# Patient Record
Sex: Male | Born: 1940
Health system: Southern US, Community
[De-identification: ages and names within clinical notes are randomized; demographics above are authoritative.]

## PROBLEM LIST (undated history)

## (undated) DIAGNOSIS — C4491 Basal cell carcinoma of skin, unspecified: Secondary | ICD-10-CM

## (undated) DIAGNOSIS — I011 Acute rheumatic endocarditis: Secondary | ICD-10-CM

## (undated) DIAGNOSIS — M1990 Unspecified osteoarthritis, unspecified site: Secondary | ICD-10-CM

## (undated) DIAGNOSIS — I10 Essential (primary) hypertension: Secondary | ICD-10-CM

## (undated) DIAGNOSIS — R0600 Dyspnea, unspecified: Secondary | ICD-10-CM

## (undated) DIAGNOSIS — M069 Rheumatoid arthritis, unspecified: Secondary | ICD-10-CM

## (undated) DIAGNOSIS — K219 Gastro-esophageal reflux disease without esophagitis: Secondary | ICD-10-CM

## (undated) DIAGNOSIS — N400 Enlarged prostate without lower urinary tract symptoms: Secondary | ICD-10-CM

## (undated) DIAGNOSIS — C439 Malignant melanoma of skin, unspecified: Secondary | ICD-10-CM

## (undated) DIAGNOSIS — D649 Anemia, unspecified: Secondary | ICD-10-CM

## (undated) HISTORY — DX: Malignant melanoma of skin, unspecified: C43.9

## (undated) HISTORY — DX: Basal cell carcinoma of skin, unspecified: C44.91

## (undated) HISTORY — DX: Anemia, unspecified: D64.9

## (undated) HISTORY — DX: Unspecified osteoarthritis, unspecified site: M19.90

## (undated) HISTORY — PX: JOINT REPLACEMENT: SHX530

## (undated) HISTORY — PX: MELANOMA EXCISION: SHX5266

## (undated) HISTORY — DX: Rheumatoid arthritis, unspecified: M06.9

## (undated) HISTORY — DX: Gastro-esophageal reflux disease without esophagitis: K21.9

## (undated) HISTORY — DX: Acute rheumatic endocarditis: I01.1

## (undated) HISTORY — PX: HERNIA REPAIR: SHX51

## (undated) HISTORY — DX: Benign prostatic hyperplasia without lower urinary tract symptoms: N40.0

## (undated) HISTORY — PX: KNEE ARTHROPLASTY: SHX992

## (undated) HISTORY — PX: ESOPHAGEAL DILATION: SHX303

## (undated) HISTORY — PX: COLONOSCOPY: SHX174

---

## 1999-08-22 ENCOUNTER — Ambulatory Visit (HOSPITAL_COMMUNITY): Admission: RE | Admit: 1999-08-22 | Discharge: 1999-08-22 | Payer: Self-pay | Admitting: Gastroenterology

## 1999-08-22 ENCOUNTER — Encounter (INDEPENDENT_AMBULATORY_CARE_PROVIDER_SITE_OTHER): Payer: Self-pay | Admitting: *Deleted

## 2000-08-06 ENCOUNTER — Inpatient Hospital Stay (HOSPITAL_COMMUNITY): Admission: RE | Admit: 2000-08-06 | Discharge: 2000-08-11 | Payer: Self-pay | Admitting: Orthopaedic Surgery

## 2000-08-11 ENCOUNTER — Inpatient Hospital Stay (HOSPITAL_COMMUNITY)
Admission: RE | Admit: 2000-08-11 | Discharge: 2000-08-17 | Payer: Self-pay | Admitting: Physical Medicine & Rehabilitation

## 2004-05-15 ENCOUNTER — Ambulatory Visit (HOSPITAL_COMMUNITY): Admission: RE | Admit: 2004-05-15 | Discharge: 2004-05-15 | Payer: Self-pay | Admitting: Gastroenterology

## 2004-05-15 ENCOUNTER — Encounter (INDEPENDENT_AMBULATORY_CARE_PROVIDER_SITE_OTHER): Payer: Self-pay | Admitting: Specialist

## 2004-12-23 ENCOUNTER — Ambulatory Visit: Payer: Self-pay | Admitting: Oncology

## 2005-04-11 ENCOUNTER — Ambulatory Visit: Payer: Self-pay | Admitting: Oncology

## 2005-07-01 ENCOUNTER — Ambulatory Visit: Payer: Self-pay | Admitting: Oncology

## 2005-10-30 ENCOUNTER — Ambulatory Visit: Payer: Self-pay | Admitting: Oncology

## 2007-12-02 ENCOUNTER — Ambulatory Visit (HOSPITAL_BASED_OUTPATIENT_CLINIC_OR_DEPARTMENT_OTHER): Admission: RE | Admit: 2007-12-02 | Discharge: 2007-12-02 | Payer: Self-pay | Admitting: Surgery

## 2011-01-14 NOTE — Op Note (Signed)
NAMEMIKAELE, STECHER                 ACCOUNT NO.:  [id number]   MEDICAL RECORD NO.:  [id number]          PATIENT TYPE:  AMB   LOCATION:  DSC                          FACILITY:  [facility]   PHYSICIAN:  Currie Paris, M.D.DATE OF BIRTH:  02/13/1941   DATE OF PROCEDURE:  DATE OF DISCHARGE:                               OPERATIVE REPORT   CCS [phone]   PREOPERATIVE DIAGNOSIS:  Large left inguinal hernia, probably indirect.   POSTOPERATIVE DIAGNOSIS:  Large left indirect hernia.   OPERATION:  Repair of left inguinal hernia with mesh.   SURGEON:  Currie Paris, M.D.   ANESTHESIA:  General.   CLINICAL HISTORY:  Mr. Antunes is a 71 year old gentleman with a fairly  large but reducible left inguinal hernia.  He desired to have this  repaired.   DESCRIPTION OF PROCEDURE:  The patient was seen in the preoperative area  and had no further questions.  We both identified and initialed the left  inguinal area as the operative site.   The patient was taken to the operating room, and after satisfactory  general anesthesia had been obtained, the left inguinal area was  clipped, prepped and draped.  The time-out was done.   To help with postoperative analgesia, I infiltrated the skin line and  around that area as well as below the fascia at the anterior iliac spine  with some 0.25% plain Marcaine.  The oblique incision was made and  deepened into the external oblique aponeurosis, with bleeders coagulated  or tied with 4-0 Vicryl.  The external oblique was opened in the line of  its fibers.  Of note is that the hernia was out at the time of induction  of anesthesia, but I was able to readily reduce it prior to making the  incision.   I was able to dissect up the cord structures and there was a large  amount of tissue protruding through the deep ring.  The floor appeared  intact.  There was really no true sac but just and extraordinarily large  amount of fatty tissue protruding through.  I  stripped this off of the  cord.  I was able to identify the vas and some of the vessels to the  testicle, but there were also some vessels within this fatty tissue.  I  was able to reduce this easily, but even putting a couple of sutures to  reapproximate the deep ring with some 2-0 Prolenes closing the  transversalis, we still had the tendency for this to protrude out  because it was still attached to the distal cord.  I therefore divided  it distally so that I could reduce this and keep it reduced and then put  a third suture to tighten the deep ring so that it was snug around the  now skeletonized cord.   I then put more local in to make sure we had good anesthesia  postoperatively.  I then took a piece of Marlex mesh and cut it to shape  so that it was split laterally and sutured in starting at pubic tubercle  and running some 2-0 Prolene to suture it inferiorly until I was well  beyond the deep ring.  Is then put down over the internal oblique such  that it laid nicely and then tacked down to the internal oblique and  then tacked laterally where it went well lateral to the deep ring.  I  thought this nicely reinforced the floor plus made an appropriate  tightness around the deep ring.   I spent several minutes making sure everything was dry and then closed  with some 3-0 Vicryl to close the external oblique and Scarpa's and 4-0  Monocryl plus Dermabond for the skin.   The patient tolerated the procedure well, and there no complications.  All counts were correct.      Currie Paris, M.D.  Electronically Signed     CJS/MEDQ  D:  12/02/2007  T:  12/02/2007  Job:  [id number]   cc:   Demetria Pore. Coral Spikes, M.D.

## 2011-01-17 NOTE — Discharge Summary (Signed)
[place]. [facility]  Patient:    Bryan Maldonado, Bryan Maldonado                        MRN: [id number] Adm. Date:  [id number] Disc. Date: [id number] Attending:  Faith Rogue T Dictator:   Prince Rome, P.A.-C.                           Discharge Summary  ADMISSION DIAGNOSES: 1. Bilateral end-stage bone-on-bone degenerative joint disease, knees. 2. Hypertension. 3. History of malignant skin melanoma and rheumatoid arthritis.  DISCHARGE DIAGNOSES: 1. Bilateral end-stage bone-on-bone degenerative joint disease, knees. 2. Hypertension. 3. History of malignant skin melanoma and rheumatoid arthritis.  OPERATION:  Bilateral total knee replacement.  BRIEF HISTORY:  This patient is a 70 year old white male and is well known to [place].  He has rheumatoid arthritis and has bone-on-bone end-stage DJD of both of his knees.  He is having pain with every step, difficulty in bending, difficulty in ambulating, trouble sleeping at nighttime.  We have injected him with corticosteroids with minimal relief.  I discussed treatment options with the patient including knee replaced.  He decided, if it was medically feasible, to do both knees at the same time.  PERTINENT LABORATORY AND X-RAY FINDINGS:  His last hemoglobin was 9.6.  INRs and pro times were drawn serially as he was on low-dose Coumadin protocol regulated by pharmacy.  Blood was replaced as necessary.  He was noted to be A positive blood type.  COURSE IN HOSPITAL:  The patient was admitted postoperatively, placed on a variety of analgesics and IV Ancef 1 g q.8h. x 3 doses.  His pain was controlled on a PCA pump.  His wounds were benign on the dressing change and drain pulling on the second day postoperatively.  Pharmacy helped with low-dose Coumadin protocol.  Physical therapy for gait training to be weightbearing as tolerated.  A rehabilitation consult was done, and he was accepted on the rehabilitation floor for continued  therapy.  CONDITION UPON DISCHARGE:  Improved.  FOLLOWUP:  We will continue to follow up with him on the rehabilitation floor. He will remain on Coumadin low-dose protocol for four weeks, p.o. medications, weightbearing as tolerated with help of physical therapy. DD:  09/26/00 TD:  09/26/00 Job: [id number]

## 2011-01-17 NOTE — Op Note (Signed)
[place]. [facility]  Patient:    Bryan Maldonado, Bryan Maldonado                        MRN: [id number] Proc. Date: 08/06/00 Adm. Date:  [id number] Attending:  Marcene Corning CC:         Demetria Pore. Coral Spikes, M.D.   Operative Report  PREOPERATIVE DIAGNOSIS:  End-stage degenerative joint disease, left knee with rheumatoid arthritis.  POSTOPERATIVE DIAGNOSIS:  End-stage degenerative joint disease, left knee with rheumatoid arthritis.  OPERATION PERFORMED:  Left total knee arthroplasty using cemented on the left, Depuy J&J components, a sigma rotating platform knee.  SURGEON:  Jearld Adjutant, M.D.  ASSISTANT: 1. Lubertha Basque Jerl Santos, M.D. 2. Prince Rome, P.A.  ANESTHESIA:  General endotracheal.  CULTURES:  None.  DRAINS:  Two medium Hemovacs.  ESTIMATED BLOOD LOSS:  150 cc.  FLUID REPLACEMENT:  Without.  TOURNIQUET TIME:  One hour 22 minutes.  PATHOLOGIC FINDINGS AND HISTORY:  The patient is a 70 year old male who has rheumatoid disease, bilateral knee degenerative joint disease end-stage. Dr. Jerl Santos did the right total knee arthroplasty today with me assisting.  I did the left and he assisted and I will dictate.  The patient has had longstanding disease.  He had a slight flexion contracture.  At surgery, he obtained good ligament balance with full extension with a #4 left femur, a #4 10 mm stability rotating platform insert, 4 MBT tray and a 38 mm patella, all poly.  There was no need on the left for a lateral retinacular release, patellar tracking was good.  Range of motion 0 to 125 with a drop test.  His ligaments were stable.  DESCRIPTION OF PROCEDURE:  With adequate anesthesia obtained using endotracheal technique 1 gm Ancef given IV prophylaxis, the patient was placed in supine position.  Both lower extremities were prepped from the toes to the tourniquet in standard fashion.  After standard prepping and draping, Esmarch exsanguination was used on  the right side and the right total knee arthroplasty carried out.  On the left side then at the end of that procedure when the tourniquet was coming down, we used Esmarch exsanguination and the tourniquet was let up to 350 mmHg.  A midline incision was made over the patella.  Incision was deepened sharply with a knife and hemostasis obtained using a Associate Professor.  Flaps were developed and a median parapatellar skin incision was then made.  The patella was then everted and a marked amount of synovitis and osteophytes were removed, pouch and both gutters and the fat pad was excised.  We then removed menisci and soft tissue from the tibial surface.  The tibial cutting jig was then put in place to the key to the low side and the cut was made initially in the midrange position. We then sized the extension cut to 10 mm, placed the distal femoral cutting jig in place.  We made that cut but we took more off femur and then went back and took more off tibia.  We then fit the 10 mm in extension.  We then placed an anterior-posterior cutting jig in place and made that cut for a 4.  We then cut the anterior posterior chamfer cuts and the box. We then exposed the proximal tibia.  We then made the sizing for the tray with the keel and center stem and made those cuts and placed it in as a trial.  This was  with the keel cut made and the keel in place.  We then trialed the femur with the tibial tray and 10 mm insert and it was still too tight and we went back and cut about 3 to 4 mm of tibia, then replaced the tibial tray, the trial 10 mm and the femoral component and we came to full extension with no tightness in flexion.  Then we ____________  the patella to 21, cut it down to 13 and 14, made a three-peg  holes and trialed the patella with good tracking.  All trial components were then removed.  Thorough jet lavage was carried out and the components checked for sizing as they came on the table.  We then cemented on the tibialtray and impacted it, removed excess cement.  We then placed the trial tibial rotating platform.  We cemented on the femoral component, impacted it, removed excess cement, put the tibial tray in place and then hyperextended the knee and further expressed cement and removed it.  We  then held the knee in extension while the cement cured to hold the femoral component in place to compress it downward.  The patella was then cemented on and impacted, removed excess cement and held with a clamp.  When the cement had hardened, we then placed the implantable real tray in place, rotatingh platform and put the knee through an articulated range of motion as above.  At this point  the tourniquet was let down, bleeding points were cauterized, additional irrigation was carried out.  Hemovac drains were laid in the medial and lateral gutter and brought out the superolateral portal. The wound was then closed in layers with #1 Vicryl on the retinaculum, 0 and 2-0 Vicryl in the subcutaneous and skin staples.  A bulky sterile compressive dressing was applied with Ace and then knee immobilizer and the patient then having tolerated the procedure well was awakened and taken to the recovery room in satisfactory condition for routine postoperative care and CPM. DD:  08/06/00 TD:  08/06/00 Job: [id number]

## 2011-01-17 NOTE — Op Note (Signed)
[place]. [facility]  Patient:    Bryan Maldonado, Bryan Maldonado                        MRN: [id number] Proc. Date: 08/06/00 Adm. Date:  [id number] Attending:  Marcene Corning                           Operative Report  PREOPERATIVE DIAGNOSIS:  Right knee degenerative arthritis.  POSTOPERATIVE DIAGNOSIS:  Right knee degenerative arthritis.  PROCEDURE:  Right total knee replacement.  ANESTHESIA:  General.  SURGEON:  Lubertha Basque. Jerl Santos, M.D.  ASSISTANT:  Jearld Adjutant, M.D. and Prince Rome, P.A.  INDICATIONS:   The patient is a 70 year old male with a long history of rheumatoid arthritis, which has lead to degenerative changes in both knees. This has persisted despite oral medications and injections and at this point has been left with disabling pain.  He is offered knee replacement surgery. We planned to do bilateral knee replacements after thorough discussion of risks and benefits.  Informed operative consent was obtained after discussion of possible complications of reaction to anesthesia, infection, DVT, PE, and death.  DESCRIPTION OF PROCEDURE:  The patient was taken to the operating suite, where general anesthetic was applied without difficulty.  He was positioned supine and prepped and draped in normal sterile fashion.  After the administration of preoperative IV antibiotics, the right leg was elevated, exsanguinated and a tourniquet inflated about the thigh.  All appropriate anti-infective measures are used including the aforementioned IV antibiotic, closed-hooded exhaust systems for each member of the surgical team and Betadine-impregnated drape. A longitudinal incision was made over the knee with dissection down to the extensor mechanism.  A medial parapatellar incision was through this structure.  The knee cap was flipped and the knee flexed past 90 degrees.  He had severe degenerative changes in all three compartments with abundant synovitis.  A  thorough synovectomy was performed, along with removal of residual meniscal tissues and ACL and PCL.  We used the [employer] and [employer]. We first performed a cut on the tibia with an appropriate guide parallel to the floor.  I then placed an intramedullary guide in the femur in order to create a distal cut on the femur.  This was revised once to create an extension gap of 10 mm.  This help correct his flexion contracture, which measured about 20 degrees or 15 degrees a sleep.  Another guide was then placed on the femur to create an appropriate flexion gap with anterior and posterior cuts on the femur.  Another guide was placed to make the size 4 femoral chamfer cuts.  This guide was also used to create the box cut in the end of the distal femur.  The tibia also sized to a size 4 and an appropriate guide was placed here and used to create the keel cut.  A trial reduction was performed with size 4 femur and size 4 tibia and a 10 mm stabilized insert. The knee ranged fully and came to extension with good flexion past 100 degrees.  The knee cap tended to track in a slightly lateral position and a lateral release was added.  We then addressed the patella and took its thickness from 20 down to 13 mm with a guide.  I then placed the tibial preparation jig on the cut surface and made the appropriate holes to hold the component.  All trial components were then removed.  Pulsatile lavage was used to irrigate all bony surfaces.  Cement was mixed including antibiotic.  This cement was then pressurized on all three bony surfaces, followed by placement of the Sigma RP [employer] components, which were size 4 tibia, size 4 femur, and 38 mm patella.  Pressure was held on all of these components until the cement had hardened.  At this point, the 10 mm stabilized insert was placed.  The knee reduced well and the patella tracked well.  The knee came to full extension and flexed past 100  degrees.   The tourniquet was deflated and the wound was thoroughly irrigated.  Bovie cautery was used to control some bleeding.  Number one Vicryl was used to reapproximate the extensor mechanism after a drain had been placed exiting superolaterally.  Once the extensor mechanism was closed, the knee flexed to about 120 degrees.  Subcutaneous tissues were reapproximated with 0 and 2-0 undyed Vicryl, followed by skin closure with staples.  Adaptic was placed on the wound, followed by dry gauze and a loose Ace wrap and a knee immobilizer.  Estimated blood loss and intraoperative fluids can be obtained from anesthesia records, as can accurate tourniquet time.  DISPOSITION:  The patient was then prepped for a left knee replacement, during which, Dr. Renae Fickle was the primary surgeon and I was the assistant. DD:  08/06/00 TD:  08/06/00 Job: [id number]
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
## 2011-03-10 ENCOUNTER — Other Ambulatory Visit: Payer: Self-pay | Admitting: Gastroenterology

## 2011-05-27 LAB — BASIC METABOLIC PANEL
BUN: 8
CO2: 28
Calcium: 8.7
Creatinine, Ser: 0.52
GFR calc Af Amer: >60

## 2011-09-23 ENCOUNTER — Other Ambulatory Visit: Payer: Self-pay | Admitting: Dermatology

## 2013-01-04 ENCOUNTER — Encounter: Payer: Self-pay | Admitting: *Deleted

## 2013-01-05 ENCOUNTER — Ambulatory Visit (INDEPENDENT_AMBULATORY_CARE_PROVIDER_SITE_OTHER): Payer: Medicare Other | Admitting: Family Medicine

## 2013-01-05 ENCOUNTER — Encounter: Payer: Self-pay | Admitting: Family Medicine

## 2013-01-05 VITALS — BP 122/77 | HR 60 | Wt 177.0 lb

## 2013-01-05 DIAGNOSIS — M0579 Rheumatoid arthritis with rheumatoid factor of multiple sites without organ or systems involvement: Secondary | ICD-10-CM | POA: Insufficient documentation

## 2013-01-05 DIAGNOSIS — Z79899 Other long term (current) drug therapy: Secondary | ICD-10-CM

## 2013-01-05 DIAGNOSIS — E785 Hyperlipidemia, unspecified: Secondary | ICD-10-CM

## 2013-01-05 DIAGNOSIS — M069 Rheumatoid arthritis, unspecified: Secondary | ICD-10-CM

## 2013-01-05 DIAGNOSIS — G894 Chronic pain syndrome: Secondary | ICD-10-CM

## 2013-01-05 LAB — LIPID PANEL
HDL: 54 mg/dL (ref >39)
LDL Cholesterol: 100 mg/dL — ABNORMAL HIGH (ref 0–99)
Triglycerides: 95 mg/dL (ref <150)

## 2013-01-05 LAB — HEPATIC FUNCTION PANEL
Albumin: 4.4 g/dL (ref 3.5–5.2)
Alkaline Phosphatase: 60 U/L (ref 39–117)
Total Protein: 7.7 g/dL (ref 6.0–8.3)

## 2013-01-05 LAB — BASIC METABOLIC PANEL
Calcium: 9.2 mg/dL (ref 8.4–10.5)
Chloride: 103 mEq/L (ref 96–112)
Creat: 0.68 mg/dL (ref 0.50–1.35)

## 2013-01-05 MED ORDER — ACETAMINOPHEN-CODEINE #3 300-30 MG PO TABS: 1.0000 | ORAL_TABLET | Freq: Two times a day (BID) | ORAL | Status: DC | PRN

## 2013-01-05 MED ORDER — OMEPRAZOLE 20 MG PO CPDR: 20.0000 mg | DELAYED_RELEASE_CAPSULE | Freq: Every day | ORAL | Status: DC

## 2013-01-05 NOTE — Progress Notes (Signed)
  Subjective:    Patient ID: Bryan Maldonado, male    DOB: 09/02/1940, 72 y.o.   MRN: [id number]  HPI Arthritis-this patient has severe arthritis he is seen arthritis doctors before they tried disease altering medications. He could not use the injections because of a history of hepatitis. He has decided mainly to use just pain medication N. Deal with the orthopedic problems of arthritis. He has significant loss of mobility and deformity and needs help with his wife to get dressed. Pain medication does help him function in he does not abuse it he is aware about not combining too much Tylenol together with the Tylenol No. 3.  GERD-He has significant reflux but he uses omeprazole this helps he denies burning in the chest denies shortness of breath laryngeal discomfort. He does try to eat sensibly.  HyperlipidemiaHe does relate that he tries to watch his diet closely has a history hyperlipidemia he did is due for a wellness exam in September. We will need to do some lab work. Past medical history pertinent for arthritis family history noncontributory patient doesn't smoke. He denies chest tightness pressure pain shortness breath rectal bleeding hematuria.   Review of Systemsnegative see above. Has severe joint discomforts and deformities.     Objective:   Physical Exam Vital signs are stable. Neck no masses lungs are clear no crackles heart is regular pulse normal abdomen soft skin warm dry severe orthopedic changes with limited range of motion of the shoulders lower back and significant deformity of the hands       Assessment & Plan:  Wellness in august/ sept Arthritis-pain control Tylenol when necessary Tylenol #3 and do not use more than 2500 mg Tylenol total per day Tylenol No. 3 no greater than 3 per day #60 with refills given. Followup again in 3-4 months. Hyperlipidemia-check lab work await results. Followup if ongoing troubles. 25 minutes spent with patient discussing his arthritis  discussing medication options. Greater than half spent in discussion

## 2013-01-05 NOTE — Patient Instructions (Signed)
It is important when you take your pain medicine to use no more than 3 in a day. As we talked about your pain medication has codeine in it which helps with the pain but it also has some Tylenol in it. Therefore you would not want to take too many of those along with your Tylenol arthritis because it would be too much Tylenol which would be bad for your liver.  Over the course of the next week or 2 please do your blood work I will let you know the results of that. We ought to see you somewhere in August or September for your wellness exam. Sooner if you have any problems.

## 2013-01-11 ENCOUNTER — Telehealth: Payer: Self-pay | Admitting: Family Medicine

## 2013-01-11 NOTE — Telephone Encounter (Signed)
Patient states he was seen Dr. Jorja Loa today.  Dr. Jorja Loa informed patient he should have a referral to see Dr. Bedelia Person at [facility].  Patient states he is having trouble feet/toes hurting him on a daily basis (States this is not new).  Gathering from the information Mr. Muzyka shared when walking in today, He and Dr. Jorja Loa had conversation regarding Mr. Lebron's Rheumatoid Arthritis and possibly going crippled.  Patient seems to be concerned that he needs to be checked by rheumatologist.  Please contact Patient and advise as to whether he needs an office visit with Dr. Lilyan Punt to discuss these matters or just a referral.

## 2013-01-19 ENCOUNTER — Other Ambulatory Visit: Payer: Self-pay | Admitting: Family Medicine

## 2013-01-19 DIAGNOSIS — M1990 Unspecified osteoarthritis, unspecified site: Secondary | ICD-10-CM

## 2013-01-27 NOTE — Telephone Encounter (Signed)
Referral has been faxed to Dr. Corliss Skains on 01/21/2013.

## 2013-01-31 ENCOUNTER — Ambulatory Visit (INDEPENDENT_AMBULATORY_CARE_PROVIDER_SITE_OTHER): Payer: Medicare Other | Admitting: Family Medicine

## 2013-01-31 ENCOUNTER — Encounter: Payer: Self-pay | Admitting: Family Medicine

## 2013-01-31 VITALS — BP 126/76 | Temp 97.4°F | Wt 179.0 lb

## 2013-01-31 DIAGNOSIS — M069 Rheumatoid arthritis, unspecified: Secondary | ICD-10-CM

## 2013-01-31 MED ORDER — PREDNISONE 5 MG PO TABS: 5.0000 mg | ORAL_TABLET | Freq: Every day | ORAL | Status: DC

## 2013-01-31 NOTE — Progress Notes (Signed)
  Subjective:    Patient ID: Bryan Maldonado, male    DOB: 02/20/1941, 72 y.o.   MRN: [id number]  Arm Pain  The incident occurred more than 1 week ago. There was no injury mechanism. The pain is present in the upper right arm. The quality of the pain is described as aching. The pain does not radiate. The pain has been worsening since the incident. Associated symptoms include muscle weakness. The symptoms are aggravated by lifting. He has tried acetaminophen for the symptoms. The treatment provided mild relief.   This patient has underlying health history of the arthritis. He relates shoulder pain and discomfort hurts with movement has limited range of motion. He is scheduled to see specialists hopefully within the next month. They have received the information and will be scheduling him seen.   Review of SystemsSee above.     Objective:   Physical Exam Lungs clear hearts regular limited range of motion of the right shoulder with subjective discomfort significant arthritis       Assessment & Plan:  Rheumatoid arthritis-I. Feel this pain is associated with that I don't think it's a pinched nerve. I recommend prednisone 5 mg a day until he sees a specialist he is to followup sometime within the next 3 months with [place].

## 2013-04-29 ENCOUNTER — Telehealth: Payer: Self-pay | Admitting: Family Medicine

## 2013-04-29 MED ORDER — PREDNISONE 5 MG PO TABS: 5.0000 mg | ORAL_TABLET | Freq: Every day | ORAL | Status: DC

## 2013-04-29 NOTE — Telephone Encounter (Signed)
May refill times 4 , f/u ov this fall

## 2013-04-29 NOTE — Telephone Encounter (Signed)
Med sent electronically to [facility] in [place].

## 2013-04-29 NOTE — Telephone Encounter (Signed)
Arthritis flare.

## 2013-04-29 NOTE — Telephone Encounter (Signed)
Patient needs a refill on predniSONE (DELTASONE) 5 MG tablet  [facility] in [place]  Please call Patient. Thanks

## 2013-04-29 NOTE — Telephone Encounter (Signed)
Prednisone 5 mg daily 

## 2013-04-29 NOTE — Telephone Encounter (Signed)
Talk with patient, find out regarding is he taking prednisone 5 daily? Did rheumatology change anything, clirify what he needs....thanks

## 2013-05-18 ENCOUNTER — Encounter: Payer: Medicare Other | Admitting: Family Medicine

## 2013-05-19 ENCOUNTER — Encounter: Payer: Self-pay | Admitting: Family Medicine

## 2013-05-19 ENCOUNTER — Ambulatory Visit (INDEPENDENT_AMBULATORY_CARE_PROVIDER_SITE_OTHER): Payer: Medicare Other | Admitting: Family Medicine

## 2013-05-19 VITALS — BP 128/70 | HR 90 | Ht 68.0 in | Wt 181.0 lb

## 2013-05-19 DIAGNOSIS — Z0000 Encounter for general adult medical examination without abnormal findings: Secondary | ICD-10-CM

## 2013-05-19 DIAGNOSIS — Z23 Encounter for immunization: Secondary | ICD-10-CM

## 2013-05-19 MED ORDER — ZOSTER VACCINE LIVE 19400 UNT/0.65ML ~~LOC~~ SOLR: 0.6500 mL | Freq: Once | SUBCUTANEOUS | Status: DC

## 2013-05-19 NOTE — Progress Notes (Signed)
  Subjective:    Patient ID: Bryan Maldonado, male    DOB: 06/07/1941, 72 y.o.   MRN: [id number]  HPIHere for a wellness exam. No falls in the last year. Waist measurement is 37.5 in. Failed mini cog. MMSE score is 24.   AWV- Annual Wellness Visit  The patient was seen for their annual wellness visit. The patient's past medical history, surgical history, and family history were reviewed. Pertinent vaccines were reviewed ( tetanus, pneumonia, shingles, flu) The patient's medication list was reviewed and updated. The height and weight were entered. The patient's current BMI is:27  Cognitive screening was completed. Outcome of Mini - Cog: failed / MMSE 24/30  Falls within the past 6 months:no Current tobacco usage:never (All patients who use tobacco were given written and verbal information on quitting)  Recent listing of emergency department/hospitalizations over the past year were reviewed.  current specialist the patient sees on a regular basis: Dr Brunilda Payor- Urology, Dr,Deveshwar- arthritis/rheumatology  Medicare annual wellness visit patient questionnaire was reviewed.  A written screening schedule for the patient for the next 5-10 years was given. Appropriate discussion of followup regarding next visit was discussed.        Review of Systems  Constitutional: Negative for fever, activity change and appetite change.  HENT: Negative for congestion, rhinorrhea and neck pain.   Eyes: Negative for discharge.  Respiratory: Negative for cough and wheezing.   Cardiovascular: Negative for chest pain.  Gastrointestinal: Negative for vomiting, abdominal pain and blood in stool.  Genitourinary: Negative for frequency and difficulty urinating.  Musculoskeletal: Positive for back pain, joint swelling and arthralgias.  Skin: Negative for rash.  Allergic/Immunologic: Negative for environmental allergies and food allergies.  Neurological: Negative for weakness and headaches.   Psychiatric/Behavioral: Negative for agitation.       Objective:   Physical Exam  Constitutional: He appears well-developed and well-nourished.  HENT:  Head: Normocephalic and atraumatic.  Right Ear: External ear normal.  Left Ear: External ear normal.  Nose: Nose normal.  Mouth/Throat: Oropharynx is clear and moist.  Eyes: EOM are normal. Pupils are equal, round, and reactive to light.  Neck: Normal range of motion. Neck supple. No thyromegaly present.  Cardiovascular: Normal rate, regular rhythm and normal heart sounds.   No murmur heard. Pulmonary/Chest: Effort normal and breath sounds normal. No respiratory distress. He has no wheezes.  Abdominal: Soft. Bowel sounds are normal. He exhibits no distension and no mass. There is no tenderness.  Genitourinary: Penis normal.  Musculoskeletal: He exhibits tenderness. He exhibits no edema.  Severe arthritis in hands  Lymphadenopathy:    He has no cervical adenopathy.  Neurological: He is alert. He exhibits normal muscle tone.  Skin: Skin is warm and dry. No erythema.  Psychiatric: He has a normal mood and affect. His behavior is normal. Judgment normal.          Assessment & Plan:  Refills Arthritis Shingles vaccine recommended Pneumonia vaccine today Colonoscopy good through 2022 Flu vaccine today Follow up in 3 to 4 months Patient was encouraged to lose weight. He does have pain medication I told him we would renew these he ought to followup in 3-4 months he will let us know when he needs a new refill

## 2013-05-19 NOTE — Patient Instructions (Addendum)
Thank you for coming for your annual wellness visit.  Please follow through on any advice that was given to you by today's visit. Remember to maintain compliance with your medications as discussed today.  Also remember it is important to eat a healthy diet and to stay physically active on a daily basis.  Please follow through with any testing or recommended followup office visits as was discussed today. You are due the following test coming up:  ALL- Colonoscopy-2022           Vaccines-do your shingles vaccine please     Males- Prostate Exam_yearly (do this with Dr.Nesi)     Finally remembered that the annual wellness visit does not take the place of regularly scheduled office visits  chronic health problems such as hypertension/diabetes/cholesterol visits.  Follow up here in 3 to 4 months

## 2013-06-23 ENCOUNTER — Ambulatory Visit (INDEPENDENT_AMBULATORY_CARE_PROVIDER_SITE_OTHER): Payer: Medicare Other | Admitting: Family Medicine

## 2013-06-23 ENCOUNTER — Encounter: Payer: Self-pay | Admitting: Family Medicine

## 2013-06-23 VITALS — BP 136/76 | Temp 97.7°F | Ht 68.0 in | Wt 183.2 lb

## 2013-06-23 DIAGNOSIS — J069 Acute upper respiratory infection, unspecified: Secondary | ICD-10-CM

## 2013-06-23 DIAGNOSIS — J209 Acute bronchitis, unspecified: Secondary | ICD-10-CM

## 2013-06-23 MED ORDER — AZITHROMYCIN 250 MG PO TABS: ORAL_TABLET | ORAL | Status: DC

## 2013-06-23 NOTE — Progress Notes (Signed)
  Subjective:    Patient ID: Bryan Maldonado, male    DOB: 06/19/1941, 72 y.o.   MRN: [id number]  Cough This is a new problem. The current episode started in the past 7 days. The cough is productive of sputum. Associated symptoms include nasal congestion. Nothing aggravates the symptoms. He has tried nothing for the symptoms.   PMH benign rheumatoid arthritis she's on methotrexate wonders if he ought to keep taking it  Patient doesn't smoke. Review of Systems  Respiratory: Positive for cough.    denies chest pain shortness breath vomiting diarrhea.     Objective:   Physical Exam Lungs are clear cough noted heart regular eardrums normal throat normal.       Assessment & Plan:  Bronchitis Zithromax as directed URI viral should run its course Arthritis may continue methotrexate hold if fevers if fevers followup immediately here or ER

## 2013-09-13 ENCOUNTER — Other Ambulatory Visit: Payer: Self-pay

## 2013-09-13 MED ORDER — OMEPRAZOLE 20 MG PO CPDR: 20.0000 mg | DELAYED_RELEASE_CAPSULE | Freq: Every day | ORAL | Status: DC

## 2013-09-19 ENCOUNTER — Encounter: Payer: Self-pay | Admitting: Family Medicine

## 2013-09-19 ENCOUNTER — Ambulatory Visit (INDEPENDENT_AMBULATORY_CARE_PROVIDER_SITE_OTHER): Payer: Medicare Other | Admitting: Family Medicine

## 2013-09-19 VITALS — BP 100/72 | Ht 68.0 in | Wt 186.2 lb

## 2013-09-19 DIAGNOSIS — G894 Chronic pain syndrome: Secondary | ICD-10-CM

## 2013-09-19 DIAGNOSIS — M069 Rheumatoid arthritis, unspecified: Secondary | ICD-10-CM

## 2013-09-19 MED ORDER — ACETAMINOPHEN-CODEINE #3 300-30 MG PO TABS: 1.0000 | ORAL_TABLET | Freq: Three times a day (TID) | ORAL | Status: DC | PRN

## 2013-09-19 MED ORDER — PREDNISONE 5 MG PO TABS: 5.0000 mg | ORAL_TABLET | Freq: Every day | ORAL | Status: DC

## 2013-09-19 MED ORDER — OMEPRAZOLE 20 MG PO CPDR: 20.0000 mg | DELAYED_RELEASE_CAPSULE | Freq: Every day | ORAL | Status: DC

## 2013-09-19 NOTE — Progress Notes (Signed)
   Subjective:    Patient ID: Bryan Maldonado, male    DOB: 1941/01/31, 73 y.o.   MRN: [id number]  HPI Patient is here today for a follow up visit on his rheumatoid arthritis. He is doing very well. No other concerns at this time.  This patient has some intermittent chronic pain due to the rheumatoid arthritis he relates overall his energy level is good he denies any chest tightness pressure pain shortness breath he states he's taken his medicines as directed and has no concerns currently  Review of Systems  Constitutional: Negative for activity change, appetite change and fatigue.  Respiratory: Negative for cough and chest tightness.   Cardiovascular: Negative for chest pain.  Gastrointestinal: Negative for abdominal pain.  Endocrine: Negative for polydipsia and polyphagia.  Genitourinary: Negative for hematuria.  Neurological: Negative for weakness.  Psychiatric/Behavioral: Negative for confusion.       Objective:   Physical Exam  Vitals reviewed. Constitutional: He appears well-nourished. No distress.  Cardiovascular: Normal rate, regular rhythm and normal heart sounds.   No murmur heard. Pulmonary/Chest: Effort normal and breath sounds normal. No respiratory distress.  Musculoskeletal: He exhibits no edema.  Lymphadenopathy:    He has no cervical adenopathy.  Neurological: He is alert.  Psychiatric: His behavior is normal.   This gentleman has significant arthritic changes of the hands and feet       Assessment & Plan:  Prostate followed by Dr. Lowella Bandy at [place], he has appointment in February he'll let us know if any changes occur  Rheumatoid by Dr Estanislado Pandy, has appointment in March she'll let us know if any changes occurred  Refill of his low-dose prednisone and his omeprazole was given. He'll followup in 6 months  Tylenol #3 refills was given. When he needs more we will be willing to provide this. He uses it rather sparingly I am comfortable seeing him back in 6  months unless he starts to have use it daily

## 2014-03-20 ENCOUNTER — Ambulatory Visit (INDEPENDENT_AMBULATORY_CARE_PROVIDER_SITE_OTHER): Payer: Medicare Other | Admitting: Family Medicine

## 2014-03-20 ENCOUNTER — Encounter: Payer: Self-pay | Admitting: Family Medicine

## 2014-03-20 VITALS — BP 128/82 | Ht 68.0 in | Wt 195.2 lb

## 2014-03-20 DIAGNOSIS — R739 Hyperglycemia, unspecified: Secondary | ICD-10-CM

## 2014-03-20 DIAGNOSIS — E785 Hyperlipidemia, unspecified: Secondary | ICD-10-CM

## 2014-03-20 DIAGNOSIS — M069 Rheumatoid arthritis, unspecified: Secondary | ICD-10-CM

## 2014-03-20 DIAGNOSIS — R7309 Other abnormal glucose: Secondary | ICD-10-CM

## 2014-03-20 DIAGNOSIS — G894 Chronic pain syndrome: Secondary | ICD-10-CM

## 2014-03-20 MED ORDER — OMEPRAZOLE 20 MG PO CPDR: 20.0000 mg | DELAYED_RELEASE_CAPSULE | Freq: Every day | ORAL | Status: DC

## 2014-03-20 MED ORDER — ACETAMINOPHEN-CODEINE #3 300-30 MG PO TABS: 1.0000 | ORAL_TABLET | Freq: Three times a day (TID) | ORAL | Status: DC | PRN

## 2014-03-20 MED ORDER — PREDNISONE 5 MG PO TABS: 5.0000 mg | ORAL_TABLET | Freq: Every day | ORAL | Status: DC

## 2014-03-20 NOTE — Progress Notes (Signed)
   Subjective:    Patient ID: Bryan Maldonado, male    DOB: 1941/04/16, 73 y.o.   MRN: [id number]  HPI Patient arrives for a follow up on arthritis. See below.  Review of Systems  Constitutional: Negative for activity change, appetite change and fatigue.  HENT: Negative for congestion.   Respiratory: Negative for cough.   Cardiovascular: Negative for chest pain.  Gastrointestinal: Negative for abdominal pain.  Endocrine: Negative for polydipsia and polyphagia.  Neurological: Negative for weakness.  Psychiatric/Behavioral: Negative for confusion.       Objective:   Physical Exam  Vitals reviewed. Constitutional: He appears well-nourished. No distress.  Cardiovascular: Normal rate, regular rhythm and normal heart sounds.   No murmur heard. Pulmonary/Chest: Effort normal and breath sounds normal. No respiratory distress.  Musculoskeletal: He exhibits no edema.  Lymphadenopathy:    He has no cervical adenopathy.  Neurological: He is alert.  Psychiatric: His behavior is normal.   Severe arthritis he uses his pain medicine denies abusing it doesn't need prescription. He feels that his overall health is hanging in there he does try to       Assessment & Plan:  refuill pain med given, will call when he needs another  Wellness in Sept  Arthritis via specialist

## 2014-05-02 ENCOUNTER — Telehealth: Payer: Self-pay | Admitting: Family Medicine

## 2014-05-02 MED ORDER — OMEPRAZOLE 20 MG PO CPDR: 20.0000 mg | DELAYED_RELEASE_CAPSULE | Freq: Every day | ORAL | Status: DC

## 2014-05-02 NOTE — Telephone Encounter (Signed)
Patient needs Rx for omeprazole 20 mg to [facility].

## 2014-05-02 NOTE — Telephone Encounter (Signed)
Rx sent electronically to pharmacy. Patient notified. 

## 2014-05-22 ENCOUNTER — Encounter: Payer: Self-pay | Admitting: Family Medicine

## 2014-05-22 ENCOUNTER — Ambulatory Visit (INDEPENDENT_AMBULATORY_CARE_PROVIDER_SITE_OTHER): Payer: Medicare Other | Admitting: Family Medicine

## 2014-05-22 VITALS — BP 132/80 | Ht <= 58 in | Wt 195.2 lb

## 2014-05-22 DIAGNOSIS — N400 Enlarged prostate without lower urinary tract symptoms: Secondary | ICD-10-CM

## 2014-05-22 DIAGNOSIS — Z0000 Encounter for general adult medical examination without abnormal findings: Secondary | ICD-10-CM

## 2014-05-22 DIAGNOSIS — Z23 Encounter for immunization: Secondary | ICD-10-CM

## 2014-05-22 NOTE — Progress Notes (Signed)
   Subjective:    Patient ID: Bryan Maldonado, male    DOB: 1941/03/13, 73 y.o.   MRN: [id number]  HPI AWV- Annual Wellness Visit  The patient was seen for their annual wellness visit. The patient's past medical history, surgical history, and family history were reviewed. Pertinent vaccines were reviewed ( tetanus, pneumonia, shingles, flu) The patient's medication list was reviewed and updated.  The height and weight were entered. The patient's current BMI is:41.52  Cognitive screening was completed. Outcome of Mini - Cog: passed  Falls within the past 6 months:none  Current tobacco usage: non-smoker (All patients who use tobacco were given written and verbal information on quitting)  Recent listing of emergency department/hospitalizations over the past year were reviewed.  current specialist the patient sees on a regular basis: Dr. Reginal Lutes, Dr. Rocky Crafts, Dr. Marge Duncans   Medicare annual wellness visit patient questionnaire was reviewed.  A written screening schedule for the patient for the next 5-10 years was given. Appropriate discussion of followup regarding next visit was discussed.  Patient states that he has no concerns at this time.     Review of Systems  Constitutional: Negative for fever, activity change and appetite change.  HENT: Negative for congestion and rhinorrhea.   Eyes: Negative for discharge.  Respiratory: Negative for cough and wheezing.   Cardiovascular: Negative for chest pain.  Gastrointestinal: Negative for vomiting, abdominal pain and blood in stool.  Genitourinary: Negative for frequency and difficulty urinating.  Musculoskeletal: Positive for arthralgias, back pain and neck pain.  Skin: Negative for rash.  Allergic/Immunologic: Negative for environmental allergies and food allergies.  Neurological: Negative for weakness and headaches.  Psychiatric/Behavioral: Negative for agitation.       Objective:   Physical Exam  Constitutional: He appears  well-developed and well-nourished.  HENT:  Head: Normocephalic and atraumatic.  Right Ear: External ear normal.  Left Ear: External ear normal.  Nose: Nose normal.  Mouth/Throat: Oropharynx is clear and moist.  Eyes: EOM are normal. Pupils are equal, round, and reactive to light.  Neck: Normal range of motion. Neck supple. No thyromegaly present.  Cardiovascular: Normal rate, regular rhythm and normal heart sounds.   No murmur heard. Pulmonary/Chest: Effort normal and breath sounds normal. No respiratory distress. He has no wheezes.  Abdominal: Soft. Bowel sounds are normal. He exhibits no distension and no mass. There is no tenderness.  Genitourinary: Penis normal.  Musculoskeletal: He exhibits no edema.  Severe arthritis  Lymphadenopathy:    He has no cervical adenopathy.  Neurological: He is alert. He exhibits normal muscle tone.  Skin: Skin is warm and dry. No erythema.  Psychiatric: He has a normal mood and affect. His behavior is normal. Judgment normal.          Assessment & Plan:  Will tend to PSA with his urologist Dr Phineas Inches is up-to-date on colonoscopy. Immunizations reviewed. Safety measures dietary measures all discussed.  Patient does have arthritis managed by specialists we do prescribe pain medicine but he has plenty currently

## 2014-05-22 NOTE — Patient Instructions (Signed)
Next colonoscopy 2022  Follow up in the spring

## 2014-05-23 LAB — LIPID PANEL
Cholesterol: 226 mg/dL — ABNORMAL HIGH (ref 0–200)
HDL: 70 mg/dL
LDL Cholesterol: 132 mg/dL — ABNORMAL HIGH (ref 0–99)
Total CHOL/HDL Ratio: 3.2 ratio
Triglycerides: 120 mg/dL
VLDL: 24 mg/dL (ref 0–40)

## 2014-05-23 LAB — BASIC METABOLIC PANEL
BUN: 15 mg/dL (ref 6–23)
CHLORIDE: 102 meq/L (ref 96–112)
CO2: 28 mEq/L (ref 19–32)
Calcium: 9.5 mg/dL (ref 8.4–10.5)
Creat: 0.75 mg/dL (ref 0.50–1.35)
GLUCOSE: 111 mg/dL — AB (ref 70–99)
POTASSIUM: 4.1 meq/L (ref 3.5–5.3)
SODIUM: 138 meq/L (ref 135–145)

## 2014-05-23 LAB — HEMOGLOBIN A1C
Hgb A1c MFr Bld: 5.9 % — ABNORMAL HIGH
Mean Plasma Glucose: 123 mg/dL — ABNORMAL HIGH

## 2014-09-27 ENCOUNTER — Telehealth: Payer: Self-pay | Admitting: Family Medicine

## 2014-09-27 DIAGNOSIS — M8580 Other specified disorders of bone density and structure, unspecified site: Secondary | ICD-10-CM

## 2014-09-27 NOTE — Telephone Encounter (Signed)
Please pull his paper chart. Please look through it and see when his last bone density was completed thank you

## 2014-09-28 NOTE — Addendum Note (Signed)
Addended by: Jesusita Oka on: 09/28/2014 04:34 PM   Modules accepted: Orders

## 2014-09-28 NOTE — Telephone Encounter (Signed)
[facility] on cell and home #

## 2014-09-28 NOTE — Telephone Encounter (Signed)
Pt due for bone density set up test ( dx osteopenia) for Feb will need follow up office visit by spring

## 2014-09-28 NOTE — Telephone Encounter (Signed)
Last bone density was 07/03/2011, according to paper chart

## 2014-09-28 NOTE — Telephone Encounter (Signed)
Wife Arbie Cookey) called back and stated that she will have the patient give [place] a call tomorrow about setting up test.

## 2014-10-06 ENCOUNTER — Other Ambulatory Visit: Payer: Self-pay | Admitting: Family Medicine

## 2014-10-27 ENCOUNTER — Telehealth: Payer: Self-pay | Admitting: Family Medicine

## 2014-10-27 MED ORDER — PERMETHRIN 5 % EX CREA: 1.0000 "application " | TOPICAL_CREAM | Freq: Once | CUTANEOUS | Status: DC

## 2014-10-27 NOTE — Telephone Encounter (Signed)
Pt advised that he will need a wellness visit sometime this spring  He is currently expressing a need for some cream to treat scabies on  His hand   [facility]

## 2014-10-27 NOTE — Telephone Encounter (Signed)
Sent in elimite cream per protocol.

## 2014-11-07 ENCOUNTER — Telehealth: Payer: Self-pay | Admitting: Family Medicine

## 2014-11-07 MED ORDER — OMEPRAZOLE 20 MG PO CPDR: 20.0000 mg | DELAYED_RELEASE_CAPSULE | Freq: Every day | ORAL | Status: DC

## 2014-11-07 MED ORDER — PREDNISONE 5 MG PO TABS: 5.0000 mg | ORAL_TABLET | Freq: Every day | ORAL | Status: DC

## 2014-11-07 NOTE — Telephone Encounter (Signed)
Pt is needing a refill on his omeprazole and his prednisone.

## 2014-11-07 NOTE — Telephone Encounter (Signed)
Rxs sent electronically to pharmacy. Patient notified. 

## 2014-11-07 NOTE — Telephone Encounter (Signed)
May refill each 4

## 2014-11-20 ENCOUNTER — Encounter: Payer: Self-pay | Admitting: Family Medicine

## 2014-11-20 ENCOUNTER — Ambulatory Visit (INDEPENDENT_AMBULATORY_CARE_PROVIDER_SITE_OTHER): Payer: PPO | Admitting: Family Medicine

## 2014-11-20 VITALS — BP 118/76 | Ht 68.0 in | Wt 194.0 lb

## 2014-11-20 DIAGNOSIS — G894 Chronic pain syndrome: Secondary | ICD-10-CM

## 2014-11-20 DIAGNOSIS — R7309 Other abnormal glucose: Secondary | ICD-10-CM | POA: Diagnosis not present

## 2014-11-20 DIAGNOSIS — N400 Enlarged prostate without lower urinary tract symptoms: Secondary | ICD-10-CM | POA: Diagnosis not present

## 2014-11-20 DIAGNOSIS — E785 Hyperlipidemia, unspecified: Secondary | ICD-10-CM

## 2014-11-20 DIAGNOSIS — R7303 Prediabetes: Secondary | ICD-10-CM | POA: Insufficient documentation

## 2014-11-20 MED ORDER — PREDNISONE 1 MG PO TABS: 4.0000 mg | ORAL_TABLET | Freq: Every day | ORAL | Status: DC

## 2014-11-20 MED ORDER — OMEPRAZOLE 20 MG PO CPDR: 20.0000 mg | DELAYED_RELEASE_CAPSULE | Freq: Every day | ORAL | Status: DC

## 2014-11-20 NOTE — Progress Notes (Signed)
   Subjective:    Patient ID: Bryan Maldonado, male    DOB: Nov 16, 1940, 74 y.o.   MRN: [id number]  HPIMed check up.   Sinus congestion started 2 weeks ago. Has this problem every spring.   Having a biopsy on prostate on April 6th. Would like to discuss stopping vitamins and asa for the procedure.  Patient states that he has ongoing rheumatoid arthritis he sees a specialist on a regular basis. He denies any problems wonders if he taper prednisone see discussion below Patient also relates chronic pain he uses pain medication as necessary to help with the pain he denies excessive use of it. Does not need refill currently States urination doing okay denies any abdominal symptoms relates reflux under good control with Prilosec     Review of Systems  Constitutional: Negative for activity change, appetite change and fatigue.  HENT: Negative for congestion.   Respiratory: Negative for cough.   Cardiovascular: Negative for chest pain.  Gastrointestinal: Negative for abdominal pain.  Endocrine: Negative for polydipsia and polyphagia.  Neurological: Negative for weakness.  Psychiatric/Behavioral: Negative for confusion.       Objective:   Physical Exam  Constitutional: He appears well-nourished. No distress.  Cardiovascular: Normal rate, regular rhythm and normal heart sounds.   No murmur heard. Pulmonary/Chest: Effort normal and breath sounds normal. No respiratory distress.  Musculoskeletal: He exhibits no edema.  Lymphadenopathy:    He has no cervical adenopathy.  Neurological: He is alert.  Psychiatric: His behavior is normal.  Vitals reviewed.  Patient with severe rheumatoid arthritis changes  25 minutes spent with patient going over his medicines and discussing these multiple issues greater than half was spent in discussion     Assessment & Plan:  Rheumatoid arthritis follow through with specialist continue medication Chronic prednisone use he was told by his rheumatologist  he may stop it he wonders if he can just stop it I told him it is best for him to taper gradually we will take away 1 mg per month starting now 4 mg for the next month gradually tapered to mid July will be off of it. Chronic pain-she does not need a refill today uses it sparingly Patient due for cholesterol profile this was ordered Patient due for renal function testing Reflux symptoms under good control Prilosec Will be getting prostate biopsy coming up with urology Follow-up again in 6 months

## 2014-11-20 NOTE — Patient Instructions (Signed)
Prednisone  Decrease from 5 mg to 4 mg daily  So therefore use 4 pills once daily for 1 month  Then 3 pills once daily for 1 month  Then 2 pills once daily for 1 month  Then 1 pill daily for 1 month

## 2014-11-21 ENCOUNTER — Encounter: Payer: Self-pay | Admitting: Family Medicine

## 2014-11-21 LAB — HEMOGLOBIN A1C
Est. average glucose Bld gHb Est-mCnc: 131 mg/dL
HEMOGLOBIN A1C: 6.2 % — AB (ref 4.8–5.6)

## 2014-11-21 LAB — LIPID PANEL
Chol/HDL Ratio: 2.7 ratio units (ref 0.0–5.0)
Cholesterol, Total: 203 mg/dL — ABNORMAL HIGH (ref 100–199)
HDL: 75 mg/dL (ref >39)
LDL Calculated: 91 mg/dL (ref 0–99)
Triglycerides: 185 mg/dL — ABNORMAL HIGH (ref 0–149)
VLDL Cholesterol Cal: 37 mg/dL (ref 5–40)

## 2015-05-22 ENCOUNTER — Ambulatory Visit: Payer: PPO | Admitting: Family Medicine

## 2015-05-22 ENCOUNTER — Ambulatory Visit (INDEPENDENT_AMBULATORY_CARE_PROVIDER_SITE_OTHER): Payer: PPO | Admitting: Family Medicine

## 2015-05-22 ENCOUNTER — Encounter: Payer: Self-pay | Admitting: Family Medicine

## 2015-05-22 VITALS — BP 130/80 | Ht 68.0 in | Wt 193.1 lb

## 2015-05-22 DIAGNOSIS — D229 Melanocytic nevi, unspecified: Secondary | ICD-10-CM | POA: Diagnosis not present

## 2015-05-22 DIAGNOSIS — Z23 Encounter for immunization: Secondary | ICD-10-CM

## 2015-05-22 DIAGNOSIS — G894 Chronic pain syndrome: Secondary | ICD-10-CM

## 2015-05-22 MED ORDER — ACETAMINOPHEN-CODEINE #3 300-30 MG PO TABS: 1.0000 | ORAL_TABLET | Freq: Three times a day (TID) | ORAL | Status: DC | PRN

## 2015-05-22 NOTE — Progress Notes (Addendum)
   Subjective:    Patient ID: Bryan Maldonado, male    DOB: 07/25/1941, 74 y.o.   MRN: [id number]  HPI Patient is here today for follow up visit on rheumatoid arthritis. Patient is doing very well. Patient states he has a spot on the top of his head that he would like the doctor to look at.  Patient states arthritis under fairly decent control he does uses pain medicine to help take the edge off pain denies abusing it. 15 minutes spent with patient. Patient has no other concerns at this time.  Review of Systems  Constitutional: Negative for activity change, appetite change and fatigue.  HENT: Negative for congestion.   Respiratory: Negative for cough.   Cardiovascular: Negative for chest pain.  Gastrointestinal: Negative for abdominal pain.  Endocrine: Negative for polydipsia and polyphagia.  Neurological: Negative for weakness.  Psychiatric/Behavioral: Negative for confusion.       Objective:   Physical Exam  Constitutional: He appears well-nourished. No distress.  Cardiovascular: Normal rate, regular rhythm and normal heart sounds.   No murmur heard. Pulmonary/Chest: Effort normal and breath sounds normal. No respiratory distress.  Musculoskeletal: He exhibits no edema.  Lymphadenopathy:    He has no cervical adenopathy.  Neurological: He is alert.  Psychiatric: His behavior is normal.  Vitals reviewed.   Patient has a nodule on top is had I recommend this be referred to dermatology for removal      Assessment & Plan:  Patient with significant arthritic pain is actually doing much better on methotrexate he's feeling much better about how things are going he uses Tylenol with Codeine intermittently sometimes none sometimes 1,2 or 3 per day he denies any other particular problems.  His overall physical exam is good except for severe arthritic changes  He will do a wellness exam later this fall in a regular follow-up in 6 months  Abnormal mole referral to dermatology for  removal

## 2015-05-22 NOTE — Addendum Note (Signed)
Addended by: Carmelina Noun on: 05/22/2015 01:12 PM   Modules accepted: Orders

## 2015-06-06 ENCOUNTER — Encounter: Payer: Self-pay | Admitting: Family Medicine

## 2015-06-06 DIAGNOSIS — C439 Malignant melanoma of skin, unspecified: Secondary | ICD-10-CM | POA: Insufficient documentation

## 2015-06-07 DIAGNOSIS — C434 Malignant melanoma of scalp and neck: Secondary | ICD-10-CM | POA: Insufficient documentation

## 2015-06-19 DIAGNOSIS — D0320 Melanoma in situ of unspecified ear and external auricular canal: Secondary | ICD-10-CM | POA: Insufficient documentation

## 2015-07-27 ENCOUNTER — Encounter: Payer: PPO | Admitting: Family Medicine

## 2015-08-15 ENCOUNTER — Encounter: Payer: Self-pay | Admitting: Family Medicine

## 2015-08-15 ENCOUNTER — Ambulatory Visit (INDEPENDENT_AMBULATORY_CARE_PROVIDER_SITE_OTHER): Payer: PPO | Admitting: Family Medicine

## 2015-08-15 VITALS — BP 124/80 | Ht 68.0 in | Wt 188.4 lb

## 2015-08-15 DIAGNOSIS — E785 Hyperlipidemia, unspecified: Secondary | ICD-10-CM | POA: Diagnosis not present

## 2015-08-15 DIAGNOSIS — R7303 Prediabetes: Secondary | ICD-10-CM

## 2015-08-15 DIAGNOSIS — M05732 Rheumatoid arthritis with rheumatoid factor of left wrist without organ or systems involvement: Secondary | ICD-10-CM

## 2015-08-15 DIAGNOSIS — G894 Chronic pain syndrome: Secondary | ICD-10-CM

## 2015-08-15 DIAGNOSIS — Z0000 Encounter for general adult medical examination without abnormal findings: Secondary | ICD-10-CM

## 2015-08-15 DIAGNOSIS — C439 Malignant melanoma of skin, unspecified: Secondary | ICD-10-CM

## 2015-08-15 DIAGNOSIS — M05731 Rheumatoid arthritis with rheumatoid factor of right wrist without organ or systems involvement: Secondary | ICD-10-CM

## 2015-08-15 MED ORDER — ACETAMINOPHEN-CODEINE #3 300-30 MG PO TABS: 1.0000 | ORAL_TABLET | Freq: Four times a day (QID) | ORAL | Status: DC | PRN

## 2015-08-15 NOTE — Progress Notes (Signed)
   Subjective:    Patient ID: Bryan Maldonado, male    DOB: 1941/05/02, 74 y.o.   MRN: [id number]  HPI The patient comes in today for a wellness visit.    A review of their health history was completed.  A review of medications was also completed.  Any needed refills: Tylenol #3  Eating habits: good  Falls/  MVA accidents in past few months: none  Regular exercise: walk  Specialist pt sees on regular basis: Urology and doctor at [place] were discussed.   Additional concerns: none  Patient had colonoscopy 2 years ago.    Review of Systems  Constitutional: Negative for activity change, appetite change and fatigue.  HENT: Negative for congestion.   Respiratory: Negative for cough.   Cardiovascular: Negative for chest pain.  Gastrointestinal: Negative for abdominal pain.  Endocrine: Negative for polydipsia and polyphagia.  Neurological: Negative for weakness.  Psychiatric/Behavioral: Negative for confusion.       Objective:   Physical Exam  Constitutional: He appears well-nourished. No distress.  Cardiovascular: Normal rate, regular rhythm and normal heart sounds.   No murmur heard. Pulmonary/Chest: Effort normal and breath sounds normal. No respiratory distress.  Musculoskeletal: He exhibits no edema.  Lymphadenopathy:    He has no cervical adenopathy.  Neurological: He is alert.  Psychiatric: His behavior is normal.  Vitals reviewed.   Patient does have melanoma scar on top is had followed by specialist  Followed by rheumatology History hyperlipidemia and prediabetes. Check lab work Discussed importance of diet active.     Assessment & Plan:  Safety dietary all discussed Not depressed no falls Up-to-date on colonoscopy Under the care of rheumatologist for his severe rheumatoid arthritis Tylenol 3's given for rheumatologic pain this does well for him he denies any side effects Follow-up in the spring

## 2015-08-16 LAB — HEMOGLOBIN A1C
Est. average glucose Bld gHb Est-mCnc: 131 mg/dL
Hgb A1c MFr Bld: 6.2 % — ABNORMAL HIGH (ref 4.8–5.6)

## 2015-08-16 LAB — LIPID PANEL
Chol/HDL Ratio: 3.2 ratio units (ref 0.0–5.0)
Cholesterol, Total: 207 mg/dL — ABNORMAL HIGH (ref 100–199)
HDL: 64 mg/dL (ref >39)
LDL Calculated: 117 mg/dL — ABNORMAL HIGH (ref 0–99)
TRIGLYCERIDES: 128 mg/dL (ref 0–149)
VLDL Cholesterol Cal: 26 mg/dL (ref 5–40)

## 2015-08-19 ENCOUNTER — Encounter: Payer: Self-pay | Admitting: Family Medicine

## 2015-10-03 DIAGNOSIS — H04123 Dry eye syndrome of bilateral lacrimal glands: Secondary | ICD-10-CM | POA: Diagnosis not present

## 2015-10-03 DIAGNOSIS — H2513 Age-related nuclear cataract, bilateral: Secondary | ICD-10-CM | POA: Diagnosis not present

## 2015-10-08 DIAGNOSIS — R972 Elevated prostate specific antigen [PSA]: Secondary | ICD-10-CM | POA: Diagnosis not present

## 2015-10-10 DIAGNOSIS — Z0000 Encounter for general adult medical examination without abnormal findings: Secondary | ICD-10-CM | POA: Diagnosis not present

## 2015-10-10 DIAGNOSIS — R972 Elevated prostate specific antigen [PSA]: Secondary | ICD-10-CM | POA: Diagnosis not present

## 2015-10-26 DIAGNOSIS — M79641 Pain in right hand: Secondary | ICD-10-CM | POA: Diagnosis not present

## 2015-10-26 DIAGNOSIS — M79672 Pain in left foot: Secondary | ICD-10-CM | POA: Diagnosis not present

## 2015-10-26 DIAGNOSIS — M79642 Pain in left hand: Secondary | ICD-10-CM | POA: Diagnosis not present

## 2015-10-26 DIAGNOSIS — M79671 Pain in right foot: Secondary | ICD-10-CM | POA: Diagnosis not present

## 2015-10-26 DIAGNOSIS — M170 Bilateral primary osteoarthritis of knee: Secondary | ICD-10-CM | POA: Diagnosis not present

## 2015-10-26 DIAGNOSIS — M0579 Rheumatoid arthritis with rheumatoid factor of multiple sites without organ or systems involvement: Secondary | ICD-10-CM | POA: Diagnosis not present

## 2015-11-26 DIAGNOSIS — Z79899 Other long term (current) drug therapy: Secondary | ICD-10-CM | POA: Diagnosis not present

## 2015-11-27 DIAGNOSIS — D225 Melanocytic nevi of trunk: Secondary | ICD-10-CM | POA: Diagnosis not present

## 2015-11-27 DIAGNOSIS — Z85820 Personal history of malignant melanoma of skin: Secondary | ICD-10-CM | POA: Diagnosis not present

## 2015-11-27 DIAGNOSIS — C44310 Basal cell carcinoma of skin of unspecified parts of face: Secondary | ICD-10-CM | POA: Diagnosis not present

## 2015-11-27 DIAGNOSIS — C44319 Basal cell carcinoma of skin of other parts of face: Secondary | ICD-10-CM | POA: Diagnosis not present

## 2015-11-27 DIAGNOSIS — C4441 Basal cell carcinoma of skin of scalp and neck: Secondary | ICD-10-CM | POA: Diagnosis not present

## 2015-11-27 DIAGNOSIS — Z1283 Encounter for screening for malignant neoplasm of skin: Secondary | ICD-10-CM | POA: Diagnosis not present

## 2015-11-27 DIAGNOSIS — D044 Carcinoma in situ of skin of scalp and neck: Secondary | ICD-10-CM | POA: Diagnosis not present

## 2015-11-27 DIAGNOSIS — Z08 Encounter for follow-up examination after completed treatment for malignant neoplasm: Secondary | ICD-10-CM | POA: Diagnosis not present

## 2015-12-10 ENCOUNTER — Other Ambulatory Visit: Payer: Self-pay | Admitting: Family Medicine

## 2015-12-31 DIAGNOSIS — L570 Actinic keratosis: Secondary | ICD-10-CM | POA: Diagnosis not present

## 2015-12-31 DIAGNOSIS — X32XXXD Exposure to sunlight, subsequent encounter: Secondary | ICD-10-CM | POA: Diagnosis not present

## 2015-12-31 DIAGNOSIS — Z08 Encounter for follow-up examination after completed treatment for malignant neoplasm: Secondary | ICD-10-CM | POA: Diagnosis not present

## 2015-12-31 DIAGNOSIS — Z85828 Personal history of other malignant neoplasm of skin: Secondary | ICD-10-CM | POA: Diagnosis not present

## 2016-02-05 DIAGNOSIS — D0321 Melanoma in situ of right ear and external auricular canal: Secondary | ICD-10-CM | POA: Diagnosis not present

## 2016-02-05 DIAGNOSIS — C434 Malignant melanoma of scalp and neck: Secondary | ICD-10-CM | POA: Diagnosis not present

## 2016-02-13 ENCOUNTER — Ambulatory Visit: Payer: PPO | Admitting: Family Medicine

## 2016-02-18 ENCOUNTER — Encounter: Payer: Self-pay | Admitting: Family Medicine

## 2016-02-18 ENCOUNTER — Ambulatory Visit (INDEPENDENT_AMBULATORY_CARE_PROVIDER_SITE_OTHER): Payer: PPO | Admitting: Family Medicine

## 2016-02-18 VITALS — BP 122/74 | Ht 68.0 in | Wt 189.0 lb

## 2016-02-18 DIAGNOSIS — N400 Enlarged prostate without lower urinary tract symptoms: Secondary | ICD-10-CM

## 2016-02-18 DIAGNOSIS — R7303 Prediabetes: Secondary | ICD-10-CM

## 2016-02-18 DIAGNOSIS — J309 Allergic rhinitis, unspecified: Secondary | ICD-10-CM | POA: Insufficient documentation

## 2016-02-18 DIAGNOSIS — E785 Hyperlipidemia, unspecified: Secondary | ICD-10-CM

## 2016-02-18 DIAGNOSIS — G894 Chronic pain syndrome: Secondary | ICD-10-CM

## 2016-02-18 DIAGNOSIS — J301 Allergic rhinitis due to pollen: Secondary | ICD-10-CM

## 2016-02-18 LAB — POCT GLYCOSYLATED HEMOGLOBIN (HGB A1C): Hemoglobin A1C: 5.8

## 2016-02-18 MED ORDER — OMEPRAZOLE 20 MG PO CPDR: 20.0000 mg | DELAYED_RELEASE_CAPSULE | Freq: Every day | ORAL | Status: DC

## 2016-02-18 MED ORDER — ACETAMINOPHEN-CODEINE #3 300-30 MG PO TABS: 1.0000 | ORAL_TABLET | Freq: Four times a day (QID) | ORAL | Status: DC | PRN

## 2016-02-18 MED ORDER — TAMSULOSIN HCL 0.4 MG PO CAPS: 0.4000 mg | ORAL_CAPSULE | Freq: Every day | ORAL | Status: DC

## 2016-02-18 NOTE — Progress Notes (Signed)
   Subjective:    Patient ID: Bryan Maldonado, male    DOB: 09/11/1940, 75 y.o.   MRN: [id number]  Hyperlipidemia This is a chronic problem. The current episode started more than 1 year ago. Pertinent negatives include no chest pain or shortness of breath. Current antihyperlipidemic treatment includes diet change and exercise. There are no compliance problems (goes to gym at senior center and works in garden, eats healthy).    Prediabetes. A1C today. 5.8 Patient does try to watch his diet he does try to stay active. He denies being depressed States omeprazole does good job keeping reflux under good control Tylenol 3 is used to help with pain he does not overuse it. Uses Flomax on a regular basis to help with urination does a good job. Does have rheumatoid arthritis followed by specialist they monitor his lab work as well Needs refill on tylenol #3, omeprazole, and flomax.   Pt states no concerns today.     Review of Systems  Constitutional: Negative for fever, activity change and fatigue.  Respiratory: Negative for cough and shortness of breath.   Cardiovascular: Negative for chest pain and leg swelling.  Neurological: Negative for headaches.       Objective:   Physical Exam  Constitutional: He appears well-nourished. No distress.  Cardiovascular: Normal rate, regular rhythm and normal heart sounds.   No murmur heard. Pulmonary/Chest: Effort normal and breath sounds normal. No respiratory distress.  Musculoskeletal: He exhibits no edema.  Lymphadenopathy:    He has no cervical adenopathy.  Neurological: He is alert.  Psychiatric: His behavior is normal.  Vitals reviewed.         Assessment & Plan:  Prediabetes good control watch diet closely difficult for patient to stay active because of severe rheumatoid disease  Hyperlipidemia check lab work. Watch diet closely. Recent lab work reviewed with patient  Chronic back pain and arthritis pain from his rheumatoid  arthritis does not overuse pain medicines prescription given urine drug screen not indicated  BPH prescription for Flomax given he states it does do a good job for him  Allergic rhinitis Claritin stay away from decongestants because of pH

## 2016-02-20 DIAGNOSIS — J301 Allergic rhinitis due to pollen: Secondary | ICD-10-CM | POA: Diagnosis not present

## 2016-02-20 DIAGNOSIS — E785 Hyperlipidemia, unspecified: Secondary | ICD-10-CM | POA: Diagnosis not present

## 2016-02-20 DIAGNOSIS — G894 Chronic pain syndrome: Secondary | ICD-10-CM | POA: Diagnosis not present

## 2016-02-20 DIAGNOSIS — N400 Enlarged prostate without lower urinary tract symptoms: Secondary | ICD-10-CM | POA: Diagnosis not present

## 2016-02-20 DIAGNOSIS — R7303 Prediabetes: Secondary | ICD-10-CM | POA: Diagnosis not present

## 2016-02-21 LAB — BASIC METABOLIC PANEL
BUN/Creatinine Ratio: 21 (ref 10–24)
BUN: 15 mg/dL (ref 8–27)
CO2: 25 mmol/L (ref 18–29)
Calcium: 8.8 mg/dL (ref 8.6–10.2)
Chloride: 100 mmol/L (ref 96–106)
Creatinine, Ser: 0.73 mg/dL — ABNORMAL LOW (ref 0.76–1.27)
GFR calc Af Amer: 105 mL/min/{1.73_m2} (ref >59)
GFR calc non Af Amer: 91 mL/min/{1.73_m2} (ref >59)
Glucose: 107 mg/dL — ABNORMAL HIGH (ref 65–99)
Potassium: 4.2 mmol/L (ref 3.5–5.2)
Sodium: 140 mmol/L (ref 134–144)

## 2016-02-21 LAB — LIPID PANEL
Chol/HDL Ratio: 3.4 ratio units (ref 0.0–5.0)
Cholesterol, Total: 199 mg/dL (ref 100–199)
HDL: 58 mg/dL (ref >39)
LDL Calculated: 113 mg/dL — ABNORMAL HIGH (ref 0–99)
TRIGLYCERIDES: 139 mg/dL (ref 0–149)
VLDL Cholesterol Cal: 28 mg/dL (ref 5–40)

## 2016-02-22 ENCOUNTER — Encounter: Payer: Self-pay | Admitting: Family Medicine

## 2016-02-22 DIAGNOSIS — Z79899 Other long term (current) drug therapy: Secondary | ICD-10-CM | POA: Diagnosis not present

## 2016-04-04 DIAGNOSIS — M0579 Rheumatoid arthritis with rheumatoid factor of multiple sites without organ or systems involvement: Secondary | ICD-10-CM | POA: Diagnosis not present

## 2016-04-04 DIAGNOSIS — Z79899 Other long term (current) drug therapy: Secondary | ICD-10-CM | POA: Diagnosis not present

## 2016-04-04 DIAGNOSIS — M79641 Pain in right hand: Secondary | ICD-10-CM | POA: Diagnosis not present

## 2016-04-04 DIAGNOSIS — M79671 Pain in right foot: Secondary | ICD-10-CM | POA: Diagnosis not present

## 2016-04-14 DIAGNOSIS — R972 Elevated prostate specific antigen [PSA]: Secondary | ICD-10-CM | POA: Diagnosis not present

## 2016-04-16 DIAGNOSIS — R351 Nocturia: Secondary | ICD-10-CM | POA: Diagnosis not present

## 2016-04-16 DIAGNOSIS — N401 Enlarged prostate with lower urinary tract symptoms: Secondary | ICD-10-CM | POA: Diagnosis not present

## 2016-04-16 DIAGNOSIS — R972 Elevated prostate specific antigen [PSA]: Secondary | ICD-10-CM | POA: Diagnosis not present

## 2016-04-21 ENCOUNTER — Telehealth: Payer: Self-pay | Admitting: Family Medicine

## 2016-04-21 NOTE — Telephone Encounter (Signed)
Form in Dr.Scott yellow folder.

## 2016-04-21 NOTE — Telephone Encounter (Signed)
Pt dropped off a handicap placard form to be filled out. Form in nurse box.

## 2016-04-22 NOTE — Telephone Encounter (Signed)
Finished

## 2016-05-07 DIAGNOSIS — Z1283 Encounter for screening for malignant neoplasm of skin: Secondary | ICD-10-CM | POA: Diagnosis not present

## 2016-05-07 DIAGNOSIS — Z08 Encounter for follow-up examination after completed treatment for malignant neoplasm: Secondary | ICD-10-CM | POA: Diagnosis not present

## 2016-05-07 DIAGNOSIS — X32XXXD Exposure to sunlight, subsequent encounter: Secondary | ICD-10-CM | POA: Diagnosis not present

## 2016-05-07 DIAGNOSIS — L570 Actinic keratosis: Secondary | ICD-10-CM | POA: Diagnosis not present

## 2016-05-07 DIAGNOSIS — Z85820 Personal history of malignant melanoma of skin: Secondary | ICD-10-CM | POA: Diagnosis not present

## 2016-05-23 DIAGNOSIS — Z79899 Other long term (current) drug therapy: Secondary | ICD-10-CM | POA: Diagnosis not present

## 2016-06-06 ENCOUNTER — Ambulatory Visit (INDEPENDENT_AMBULATORY_CARE_PROVIDER_SITE_OTHER): Payer: PPO | Admitting: *Deleted

## 2016-06-06 DIAGNOSIS — Z23 Encounter for immunization: Secondary | ICD-10-CM | POA: Diagnosis not present

## 2016-07-16 ENCOUNTER — Other Ambulatory Visit: Payer: Self-pay | Admitting: Rheumatology

## 2016-07-16 NOTE — Telephone Encounter (Signed)
Last visit 04/12/16 Next visit not scheduled, message sent to [facility] 05/23/16 Ok to refill per Dr Estanislado Pandy

## 2016-08-15 ENCOUNTER — Encounter: Payer: PPO | Admitting: Family Medicine

## 2016-08-18 ENCOUNTER — Encounter: Payer: Self-pay | Admitting: Family Medicine

## 2016-08-18 ENCOUNTER — Ambulatory Visit (INDEPENDENT_AMBULATORY_CARE_PROVIDER_SITE_OTHER): Payer: PPO | Admitting: Family Medicine

## 2016-08-18 ENCOUNTER — Telehealth: Payer: Self-pay | Admitting: Family Medicine

## 2016-08-18 VITALS — BP 120/70 | Ht 67.0 in | Wt 186.1 lb

## 2016-08-18 DIAGNOSIS — G894 Chronic pain syndrome: Secondary | ICD-10-CM | POA: Diagnosis not present

## 2016-08-18 DIAGNOSIS — Z0000 Encounter for general adult medical examination without abnormal findings: Secondary | ICD-10-CM | POA: Diagnosis not present

## 2016-08-18 DIAGNOSIS — E784 Other hyperlipidemia: Secondary | ICD-10-CM

## 2016-08-18 DIAGNOSIS — E7849 Other hyperlipidemia: Secondary | ICD-10-CM

## 2016-08-18 DIAGNOSIS — R7303 Prediabetes: Secondary | ICD-10-CM | POA: Diagnosis not present

## 2016-08-18 MED ORDER — ACETAMINOPHEN-CODEINE #3 300-30 MG PO TABS: ORAL_TABLET | ORAL | 0 refills | Status: DC

## 2016-08-18 MED ORDER — OMEPRAZOLE 20 MG PO CPDR: 20.0000 mg | DELAYED_RELEASE_CAPSULE | Freq: Every day | ORAL | 1 refills | Status: DC

## 2016-08-18 MED ORDER — TAMSULOSIN HCL 0.4 MG PO CAPS: 0.4000 mg | ORAL_CAPSULE | Freq: Every day | ORAL | 1 refills | Status: DC

## 2016-08-18 NOTE — Progress Notes (Signed)
The patient comes in today for a wellness visit.    A review of their health history was completed.  A review of medications was also completed.  Any needed refills: yes  Eating habits: good  Falls/  MVA accidents in past few months: none  Regular exercise: good, [employer] weekly  Specialist pt sees on regular basis: Rheumatologist, urology, dermatology  Preventative health issues were discussed.   Additional concerns: none

## 2016-08-18 NOTE — Telephone Encounter (Signed)
When patient checked out today, he didn't have any check out note to follow up.  When is patient due to come back?

## 2016-08-18 NOTE — Telephone Encounter (Signed)
6 months please 

## 2016-08-18 NOTE — Progress Notes (Signed)
Subjective:    Patient ID: Bryan Homans Sr., male    DOB: 08/14/1941, 75 y.o.   MRN: [id number]  HPI Please see other note by the nurse. Patient overall doing well with wellness. Memory and focus is doing good. No accidents injuries or falls. No depression. Patient taking his medications from specialist for rheumatoid arthritis stable. Patient has history of hyperlipidemia watches his diet also has history of prediabetes tries to be careful with starches has chronic pain associated with rheumatoid arthritis does need a refill on this medication denies rectal bleeding chest pressure vomiting hematuria   Review of Systems  Constitutional: Negative for activity change, appetite change and fever.  HENT: Negative for congestion and rhinorrhea.   Eyes: Negative for discharge.  Respiratory: Negative for cough and wheezing.   Cardiovascular: Negative for chest pain.  Gastrointestinal: Negative for abdominal pain, blood in stool and vomiting.  Genitourinary: Negative for difficulty urinating and frequency.  Musculoskeletal: Negative for neck pain.  Skin: Negative for rash.  Allergic/Immunologic: Negative for environmental allergies and food allergies.  Neurological: Negative for weakness and headaches.  Psychiatric/Behavioral: Negative for agitation.       Objective:   Physical Exam  Constitutional: He appears well-developed and well-nourished.  HENT:  Head: Normocephalic and atraumatic.  Right Ear: External ear normal.  Left Ear: External ear normal.  Nose: Nose normal.  Mouth/Throat: Oropharynx is clear and moist.  Eyes: EOM are normal. Pupils are equal, round, and reactive to light.  Neck: Normal range of motion. Neck supple. No thyromegaly present.  Cardiovascular: Normal rate, regular rhythm and normal heart sounds.   No murmur heard. Pulmonary/Chest: Effort normal and breath sounds normal. No respiratory distress. He has no wheezes.  Abdominal: Soft. Bowel sounds are normal. He  exhibits no distension and no mass. There is no tenderness.  Genitourinary: Penis normal.  Musculoskeletal: Normal range of motion. He exhibits no edema.  Lymphadenopathy:    He has no cervical adenopathy.  Neurological: He is alert. He exhibits normal muscle tone.  Skin: Skin is warm and dry. No erythema.  Psychiatric: He has a normal mood and affect. His behavior is normal. Judgment normal.   Prostate followed by specialist       Assessment & Plan:  Adult wellness-complete.wellness physical was conducted today. Importance of diet and exercise were discussed in detail. In addition to this a discussion regarding safety was also covered. We also reviewed over immunizations and gave recommendations regarding current immunization needed for age. In addition to this additional areas were also touched on including: Preventative health exams needed: Colonoscopy not indicated  Patient was advised yearly wellness exam  The patient was seen today as part of a comprehensive visit regarding pain control. Patient's compliance with the medication as well as discussion regarding effectiveness was completed. Prescriptions were written. The patient was assessed for any signs of severe side effects. The patient was advised to take the medicine as directed and to report to [place] if any side effect issues.  The patient was seen today as part of an evaluation regarding hyperlipidemia. Recent lab work has been reviewed with the patient as well as the goals for good cholesterol care. In addition to this medications have been discussed the importance of compliance with diet Finally the patient is aware that poor control of cholesterol, noncompliance can dramatically increase her risk of heart attack strokes and premature death. The patient will keep regular office visits and the patient does agreed to periodic lab work.

## 2016-08-18 NOTE — Telephone Encounter (Signed)
Notified patient on voice mail that he should follow up in 6 months and to please call office to schedule.

## 2016-08-19 ENCOUNTER — Encounter: Payer: Self-pay | Admitting: Family Medicine

## 2016-08-19 LAB — HEPATIC FUNCTION PANEL
ALBUMIN: 4.4 g/dL (ref 3.5–4.8)
ALT: 13 IU/L (ref 0–44)
AST: 20 IU/L (ref 0–40)
Alkaline Phosphatase: 91 IU/L (ref 39–117)
Bilirubin Total: 0.6 mg/dL (ref 0.0–1.2)
Bilirubin, Direct: 0.16 mg/dL (ref 0.00–0.40)
Total Protein: 7.1 g/dL (ref 6.0–8.5)

## 2016-08-19 LAB — BASIC METABOLIC PANEL
BUN / CREAT RATIO: 20 (ref 10–24)
BUN: 17 mg/dL (ref 8–27)
CO2: 28 mmol/L (ref 18–29)
CREATININE: 0.85 mg/dL (ref 0.76–1.27)
Calcium: 9.3 mg/dL (ref 8.6–10.2)
Chloride: 100 mmol/L (ref 96–106)
GFR, EST AFRICAN AMERICAN: 98 mL/min/{1.73_m2} (ref >59)
GFR, EST NON AFRICAN AMERICAN: 85 mL/min/{1.73_m2} (ref >59)
Glucose: 114 mg/dL — ABNORMAL HIGH (ref 65–99)
Potassium: 4.3 mmol/L (ref 3.5–5.2)
SODIUM: 141 mmol/L (ref 134–144)

## 2016-08-19 LAB — LIPID PANEL
CHOL/HDL RATIO: 3.2 ratio (ref 0.0–5.0)
CHOLESTEROL TOTAL: 212 mg/dL — AB (ref 100–199)
HDL: 67 mg/dL (ref >39)
LDL CALC: 127 mg/dL — AB (ref 0–99)
Triglycerides: 91 mg/dL (ref 0–149)
VLDL CHOLESTEROL CAL: 18 mg/dL (ref 5–40)

## 2016-08-27 ENCOUNTER — Other Ambulatory Visit: Payer: Self-pay | Admitting: Radiology

## 2016-08-27 DIAGNOSIS — Z79899 Other long term (current) drug therapy: Secondary | ICD-10-CM | POA: Diagnosis not present

## 2016-08-27 LAB — CBC WITH DIFFERENTIAL/PLATELET
BASOS ABS: 0 {cells}/uL (ref 0–200)
BASOS PCT: 0 %
EOS ABS: 55 {cells}/uL (ref 15–500)
EOS PCT: 1 %
HCT: 38.6 % (ref 38.5–50.0)
HEMOGLOBIN: 12.6 g/dL — AB (ref 13.2–17.1)
LYMPHS ABS: 2475 {cells}/uL (ref 850–3900)
Lymphocytes Relative: 45 %
MCH: 31.5 pg (ref 27.0–33.0)
MCHC: 32.6 g/dL (ref 32.0–36.0)
MCV: 96.5 fL (ref 80.0–100.0)
MONOS PCT: 7 %
MPV: 9.6 fL (ref 7.5–12.5)
Monocytes Absolute: 385 cells/uL (ref 200–950)
Neutro Abs: 2585 cells/uL (ref 1500–7800)
Neutrophils Relative %: 47 %
Platelets: 206 10*3/uL (ref 140–400)
RBC: 4 MIL/uL — ABNORMAL LOW (ref 4.20–5.80)
RDW: 15.3 % — AB (ref 11.0–15.0)
WBC: 5.5 10*3/uL (ref 3.8–10.8)

## 2016-08-27 LAB — COMPLETE METABOLIC PANEL WITH GFR
ALT: 12 U/L (ref 9–46)
AST: 18 U/L (ref 10–35)
Albumin: 4.2 g/dL (ref 3.6–5.1)
Alkaline Phosphatase: 78 U/L (ref 40–115)
BUN: 13 mg/dL (ref 7–25)
CALCIUM: 9.2 mg/dL (ref 8.6–10.3)
CHLORIDE: 104 mmol/L (ref 98–110)
CO2: 28 mmol/L (ref 20–31)
Creat: 0.85 mg/dL (ref 0.70–1.18)
GFR, EST NON AFRICAN AMERICAN: 85 mL/min (ref >=60)
Glucose, Bld: 130 mg/dL — ABNORMAL HIGH (ref 65–99)
POTASSIUM: 4.1 mmol/L (ref 3.5–5.3)
Sodium: 141 mmol/L (ref 135–146)
Total Bilirubin: 0.8 mg/dL (ref 0.2–1.2)
Total Protein: 6.8 g/dL (ref 6.1–8.1)

## 2016-08-31 NOTE — Progress Notes (Signed)
Labs normal.

## 2016-09-02 ENCOUNTER — Telehealth: Payer: Self-pay | Admitting: Radiology

## 2016-09-02 NOTE — Telephone Encounter (Signed)
-----   Message from Bo Merino, MD sent at 08/31/2016  7:43 PM EST ----- Labs normal

## 2016-09-02 NOTE — Telephone Encounter (Signed)
I have called patient to advise labs are normal  

## 2016-09-10 DIAGNOSIS — C434 Malignant melanoma of scalp and neck: Secondary | ICD-10-CM | POA: Diagnosis not present

## 2016-09-19 ENCOUNTER — Ambulatory Visit (INDEPENDENT_AMBULATORY_CARE_PROVIDER_SITE_OTHER): Payer: PPO | Admitting: Rheumatology

## 2016-09-19 ENCOUNTER — Encounter: Payer: Self-pay | Admitting: Rheumatology

## 2016-09-19 VITALS — Wt 189.0 lb

## 2016-09-19 DIAGNOSIS — Z862 Personal history of diseases of the blood and blood-forming organs and certain disorders involving the immune mechanism: Secondary | ICD-10-CM | POA: Diagnosis not present

## 2016-09-19 DIAGNOSIS — M05741 Rheumatoid arthritis with rheumatoid factor of right hand without organ or systems involvement: Secondary | ICD-10-CM

## 2016-09-19 DIAGNOSIS — Z79899 Other long term (current) drug therapy: Secondary | ICD-10-CM | POA: Insufficient documentation

## 2016-09-19 DIAGNOSIS — Z96653 Presence of artificial knee joint, bilateral: Secondary | ICD-10-CM

## 2016-09-19 DIAGNOSIS — K219 Gastro-esophageal reflux disease without esophagitis: Secondary | ICD-10-CM

## 2016-09-19 DIAGNOSIS — M05742 Rheumatoid arthritis with rheumatoid factor of left hand without organ or systems involvement: Secondary | ICD-10-CM

## 2016-09-19 DIAGNOSIS — Z85828 Personal history of other malignant neoplasm of skin: Secondary | ICD-10-CM | POA: Diagnosis not present

## 2016-09-19 DIAGNOSIS — Z85820 Personal history of malignant melanoma of skin: Secondary | ICD-10-CM | POA: Diagnosis not present

## 2016-09-19 DIAGNOSIS — N400 Enlarged prostate without lower urinary tract symptoms: Secondary | ICD-10-CM

## 2016-09-19 MED ORDER — METHOTREXATE 2.5 MG PO TABS: ORAL_TABLET | ORAL | 0 refills | Status: DC

## 2016-09-19 NOTE — Patient Instructions (Signed)
Standing Labs We placed an order today for your standing lab work.    Please come back and get your standing labs in March 2018 and every 3 months.    We have open lab Monday through Friday from 8:30-11:30 AM and 1:30-4 PM at the office of Dr. Tresa Moore, PA.   The office is located at [address] [place], [place], [place] [address] No appointment is necessary.   Labs are drawn by [facility].  You may receive a bill from [facility] for your lab work.

## 2016-09-19 NOTE — Progress Notes (Signed)
Rheumatology Medication Review by a Pharmacist Does the patient feel that his/her medications are working for him/her?  Yes Has the patient been experiencing any side effects to the medications prescribed?  No Does the patient have any problems obtaining medications?  No  Issues to address at subsequent visits: None   Pharmacist comments:  Mr. Obier is a pleasant 76 yo who presents for follow up of his rheumatoid arthritis.  He is currently taking methotrexate 6 tablets weekly and folic acid 2 mg daily.  His most recent standing labs were on 08/27/16.  His CMP was normal except for blood glucose of 130 and his CBC showed RBC of 4 and Hgb 12.6.  Patient will be due for standing labs again in March 2018.  Patient denies any questions regarding his medications at this time.    Elisabeth Most, Pharm.D., BCPS, CPP Clinical Pharmacist Pager: [phone] Phone: [phone] 09/19/2016 10:16 AM

## 2016-09-19 NOTE — Progress Notes (Signed)
Office Visit Note  Patient: Bryan Thiesen Meyer Sr.             Date of Birth: 07/22/1941           MRN: [id number]             PCP: Sallee Lange, MD Referring: Kathyrn Drown, MD Visit Date: 09/19/2016 Occupation: @GUAROCC @    Subjective:  Pain hands   History of Present Illness: Bryan HOLSTE Sr. is a 76 y.o. male with history of severe rheumatoid arthritis. He states she's been shoveling snow which causes some stiffness and pain. He has minimal morning stiffness he has some difficulty with the daily activities but it is not changed. He denies any increased joint swelling. He's been tolerating his medications well. Without any side effects.  Activities of Daily Living:  Patient reports morning stiffness for 10 minutes.   Patient Denies nocturnal pain.  Difficulty dressing/grooming: Denies Difficulty climbing stairs: Reports Difficulty getting out of chair: Reports Difficulty using hands for taps, buttons, cutlery, and/or writing: Reports   Review of Systems  Constitutional: Negative for fatigue, night sweats and weakness ( ).  HENT: Negative for mouth sores, mouth dryness and nose dryness.   Eyes: Negative for redness and dryness.  Respiratory: Negative for shortness of breath and difficulty breathing.   Cardiovascular: Negative for chest pain, palpitations, hypertension, irregular heartbeat and swelling in legs/feet.  Gastrointestinal: Negative for constipation and diarrhea.  Endocrine: Negative for increased urination.  Musculoskeletal: Positive for arthralgias, joint pain and morning stiffness. Negative for joint swelling, myalgias, muscle weakness, muscle tenderness and myalgias.  Skin: Positive for hair loss. Negative for color change, rash, nodules/bumps, skin tightness, ulcers and sensitivity to sunlight.  Allergic/Immunologic: Negative for susceptible to infections.  Neurological: Negative for dizziness, fainting, memory loss and night sweats.  Hematological: Negative  for swollen glands.  Psychiatric/Behavioral: Negative for depressed mood and sleep disturbance. The patient is not nervous/anxious.     PMFS History:  Patient Active Problem List   Diagnosis Date Noted  . High risk medication use 09/19/2016  . History of total knee replacement, bilateral 09/19/2016  . Gastroesophageal reflux disease without esophagitis 09/19/2016  . History of melanoma 09/19/2016  . History of basal cell carcinoma 09/19/2016  . History of anemia 09/19/2016  . Allergic rhinitis 02/18/2016  . Melanoma of skin ([place]) 06/06/2015  . Prediabetes 11/20/2014  . Hyperlipidemia 11/20/2014  . BPH (benign prostatic hyperplasia) 05/22/2014  . Rheumatoid arthritis ([place]) 01/05/2013  . Chronic pain syndrome 01/05/2013    Past Medical History:  Diagnosis Date  . Anemia   . Basal cell carcinoma   . BPH (benign prostatic hyperplasia)   . GERD (gastroesophageal reflux disease)   . Melanoma ([place])   . Osteoarthritis   . Rheumatoid aortitis     Family History  Problem Relation Age of Onset  . Rheum arthritis Mother   . Emphysema Father   . Cancer Brother   . Emphysema Sister    Past Surgical History:  Procedure Laterality Date  . COLONOSCOPY    . HERNIA REPAIR    . JOINT REPLACEMENT    . MELANOMA EXCISION     Social History   Social History Narrative  . No narrative on file     Objective: Vital Signs: Wt 189 lb (85.7 kg)   BMI 29.60 kg/m    Physical Exam  Constitutional: He is oriented to person, place, and time. He appears well-developed and well-nourished.  HENT:  Head: Normocephalic  and atraumatic.  Eyes: Conjunctivae and EOM are normal. Pupils are equal, round, and reactive to light.  Neck: Normal range of motion. Neck supple.  Cardiovascular: Normal rate, regular rhythm and normal heart sounds.   Pulmonary/Chest: Effort normal and breath sounds normal.  Abdominal: Soft. Bowel sounds are normal.  Neurological: He is alert and oriented to person, place,  and time.  Skin: Skin is warm and dry. Capillary refill takes less than 2 seconds.  Psychiatric: He has a normal mood and affect. His behavior is normal.  Nursing note and vitals reviewed.    Musculoskeletal Exam: Limited range of motion of his C-spine. Thoracic and lumbar spine good range of motion. He had a limited range of motion of her shoulder joints, contractures in his bilateral elbows. He has limited range of motion of his wrist joints he has incomplete fist formation with some swan-neck deformities in all of his digits. Hip joints are good range of motion his bilateral total knee replacement. Which is doing well without any warmth swelling or effusion. He has some hammertoes bilaterally with overcrowding of toes and subluxation at MTP joints. He has some callus formation between his toes.  CDAI Exam: CDAI Homunculus Exam:   Joint Counts:  CDAI Tender Joint count: 0 CDAI Swollen Joint count: 0  Global Assessments:  Patient Global Assessment: 5 Provider Global Assessment: 5  CDAI Calculated Score: 10    Investigation: Findings:  July 2014 TB gold negative, HIV negative, hepatitis negative, 05/23/2016 CBC hemoglobin 11.4, CMP normal, 08/27/2016 CBC normal hemoglobin 12.6, CMP normal glucose 140    Imaging: No results found.  Speciality Comments: No specialty comments available.    Procedures:  No procedures performed Allergies: Morphine and related   Assessment / Plan:     Visit Diagnoses: Rheumatoid arthritis  - Positive rheumatoid factor, positive CCP, severe erosive disease with nodulosis and contractures. Limited range of motion of bilateral shoulders, elbows, wrist. He has multiple contractures with synovial thickening but no synovitis on examination today. He is tolerating his medications well.   High risk medication use - Methotrexate next tablets by mouth every week, folic acid 1 mg by mouth daily. His labs are within normal limits we'll continue to check his  labs every 3 months. We'll refill his methotrexate today.  History of total knee replacement, bilateral - 2002: Doing well  Gastroesophageal reflux disease without esophagitis: Currently not symptomatic  History of melanoma - Recurrence 2017  History of basal cell carcinoma  Benign prostatic hyperplasia without lower urinary tract symptoms  History of anemia - Hematological workup negative    Orders: No orders of the defined types were placed in this encounter.  Meds ordered this encounter  Medications  . methotrexate (RHEUMATREX) 2.5 MG tablet    Sig: TAKE SIX TABLETS BY MOUTH ONCE A WEEK    Dispense:  72 tablet    Refill:  0    Please consider 90 day supplies to promote better adherence    Face-to-face time spent with patient was 30 minutes. 50% of time was spent in counseling and coordination of care.  Follow-Up Instructions: Return in about 5 months (around 02/17/2017) for Rheumatoid arthritis.   Bo Merino, MD  Note - This record has been created using Editor, commissioning.  Chart creation errors have been sought, but may not always  have been located. Such creation errors do not reflect on  the standard of medical care.

## 2016-09-22 ENCOUNTER — Telehealth: Payer: Self-pay | Admitting: Family Medicine

## 2016-09-22 DIAGNOSIS — H6120 Impacted cerumen, unspecified ear: Secondary | ICD-10-CM

## 2016-09-22 NOTE — Telephone Encounter (Signed)
Pt is needing a referral to ent for a clogged up ear. Please advise.

## 2016-09-22 NOTE — Telephone Encounter (Signed)
No ear pain. Pt just wants ear wax flushed out. Referral put in. Pt notified.

## 2016-09-22 NOTE — Telephone Encounter (Signed)
Left message to return call 

## 2016-09-23 ENCOUNTER — Encounter: Payer: Self-pay | Admitting: Family Medicine

## 2016-10-01 DIAGNOSIS — R972 Elevated prostate specific antigen [PSA]: Secondary | ICD-10-CM | POA: Diagnosis not present

## 2016-10-06 DIAGNOSIS — N400 Enlarged prostate without lower urinary tract symptoms: Secondary | ICD-10-CM | POA: Diagnosis not present

## 2016-10-06 DIAGNOSIS — R972 Elevated prostate specific antigen [PSA]: Secondary | ICD-10-CM | POA: Diagnosis not present

## 2016-10-13 ENCOUNTER — Ambulatory Visit (INDEPENDENT_AMBULATORY_CARE_PROVIDER_SITE_OTHER): Payer: PPO | Admitting: Otolaryngology

## 2016-10-13 DIAGNOSIS — H903 Sensorineural hearing loss, bilateral: Secondary | ICD-10-CM

## 2016-10-13 DIAGNOSIS — H6123 Impacted cerumen, bilateral: Secondary | ICD-10-CM | POA: Diagnosis not present

## 2016-11-05 DIAGNOSIS — Z85820 Personal history of malignant melanoma of skin: Secondary | ICD-10-CM | POA: Diagnosis not present

## 2016-11-05 DIAGNOSIS — X32XXXD Exposure to sunlight, subsequent encounter: Secondary | ICD-10-CM | POA: Diagnosis not present

## 2016-11-05 DIAGNOSIS — L570 Actinic keratosis: Secondary | ICD-10-CM | POA: Diagnosis not present

## 2016-11-05 DIAGNOSIS — Z08 Encounter for follow-up examination after completed treatment for malignant neoplasm: Secondary | ICD-10-CM | POA: Diagnosis not present

## 2016-11-05 DIAGNOSIS — Z1283 Encounter for screening for malignant neoplasm of skin: Secondary | ICD-10-CM | POA: Diagnosis not present

## 2016-11-24 ENCOUNTER — Other Ambulatory Visit: Payer: Self-pay

## 2016-11-24 DIAGNOSIS — Z79899 Other long term (current) drug therapy: Secondary | ICD-10-CM | POA: Diagnosis not present

## 2016-11-24 LAB — COMPLETE METABOLIC PANEL WITH GFR
ALT: 9 U/L (ref 9–46)
AST: 16 U/L (ref 10–35)
Albumin: 3.8 g/dL (ref 3.6–5.1)
Alkaline Phosphatase: 69 U/L (ref 40–115)
BUN: 13 mg/dL (ref 7–25)
CHLORIDE: 107 mmol/L (ref 98–110)
CO2: 27 mmol/L (ref 20–31)
CREATININE: 0.89 mg/dL (ref 0.70–1.18)
Calcium: 8.9 mg/dL (ref 8.6–10.3)
GFR, Est African American: >89 mL/min (ref >=60)
GFR, Est Non African American: 83 mL/min (ref >=60)
Glucose, Bld: 107 mg/dL — ABNORMAL HIGH (ref 65–99)
POTASSIUM: 4.2 mmol/L (ref 3.5–5.3)
SODIUM: 141 mmol/L (ref 135–146)
Total Bilirubin: 1.1 mg/dL (ref 0.2–1.2)
Total Protein: 6.5 g/dL (ref 6.1–8.1)

## 2016-11-24 LAB — CBC WITH DIFFERENTIAL/PLATELET
BASOS PCT: 0 %
Basophils Absolute: 0 cells/uL (ref 0–200)
EOS ABS: 124 {cells}/uL (ref 15–500)
Eosinophils Relative: 2 %
HCT: 36 % — ABNORMAL LOW (ref 38.5–50.0)
Hemoglobin: 11.9 g/dL — ABNORMAL LOW (ref 13.2–17.1)
LYMPHS PCT: 43 %
Lymphs Abs: 2666 cells/uL (ref 850–3900)
MCH: 32.5 pg (ref 27.0–33.0)
MCHC: 33.1 g/dL (ref 32.0–36.0)
MCV: 98.4 fL (ref 80.0–100.0)
MONO ABS: 620 {cells}/uL (ref 200–950)
MPV: 9.7 fL (ref 7.5–12.5)
Monocytes Relative: 10 %
NEUTROS PCT: 45 %
Neutro Abs: 2790 cells/uL (ref 1500–7800)
Platelets: 196 10*3/uL (ref 140–400)
RBC: 3.66 MIL/uL — AB (ref 4.20–5.80)
RDW: 14.7 % (ref 11.0–15.0)
WBC: 6.2 10*3/uL (ref 3.8–10.8)

## 2016-11-25 NOTE — Progress Notes (Signed)
Labs normal, mild anemia

## 2017-01-07 ENCOUNTER — Other Ambulatory Visit: Payer: Self-pay | Admitting: Rheumatology

## 2017-01-07 NOTE — Telephone Encounter (Signed)
ok 

## 2017-01-07 NOTE — Telephone Encounter (Signed)
Last Visit: 09/19/16 Next Visit: 03/10/17 Labs: 11/24/16 Labs normal, mild anemia  Okay to refill MTX?

## 2017-02-15 NOTE — Progress Notes (Signed)
Office Visit Note  Patient: Bryan Durrell Gebhardt Sr.             Date of Birth: 1941-08-18           MRN: [id number]             PCP: Kathyrn Drown, MD Referring: Kathyrn Drown, MD Visit Date: 02/18/2017 Occupation: @GUAROCC @    Subjective:  Medication Management (has complaints of eye problems today )   History of Present Illness: Bryan HOLZHAUER Sr. is a 76 y.o. male with history of sero positive rheumatoid arthritis. He denies having increased joint swelling. He continues to have some stiffness in his joints which is not increased from his baseline. He's been having a lot of problems with allergies. Patient states she's been having problems with his eyes due to allergies and he needs cataract surgery. He wants to establish with an ophthalmologist. His ophthalmologist has moved. His bilateral total knee replacement is doing well.  Activities of Daily Living:  Patient reports morning stiffness for0 minute.   Patient Denies nocturnal pain.  Difficulty dressing/grooming: Denies Difficulty climbing stairs: Denies Difficulty getting out of chair: Denies Difficulty using hands for taps, buttons, cutlery, and/or writing: Reports   Review of Systems  Constitutional: Negative for fatigue, night sweats and weakness ( ).  HENT: Positive for mouth dryness. Negative for mouth sores and nose dryness.   Eyes: Positive for dryness. Negative for redness.  Respiratory: Negative for shortness of breath and difficulty breathing.   Cardiovascular: Negative for chest pain, palpitations, hypertension, irregular heartbeat and swelling in legs/feet.  Gastrointestinal: Positive for constipation. Negative for diarrhea.  Endocrine: Negative for increased urination.  Musculoskeletal: Positive for arthralgias and joint pain. Negative for joint swelling, myalgias, muscle weakness, morning stiffness, muscle tenderness and myalgias.  Skin: Negative for color change, rash, hair loss, nodules/bumps, skin tightness,  ulcers and sensitivity to sunlight.  Allergic/Immunologic: Negative for susceptible to infections.  Neurological: Negative for dizziness, fainting, memory loss and night sweats.  Hematological: Negative for swollen glands.  Psychiatric/Behavioral: Negative for sleep disturbance. The patient is not nervous/anxious.     PMFS History:  Patient Active Problem List   Diagnosis Date Noted  . High risk medication use 09/19/2016  . History of total knee replacement, bilateral 09/19/2016  . Gastroesophageal reflux disease without esophagitis 09/19/2016  . History of melanoma 09/19/2016  . History of basal cell carcinoma 09/19/2016  . History of anemia 09/19/2016  . Allergic rhinitis 02/18/2016  . Melanoma of skin ([place]) 06/06/2015  . Prediabetes 11/20/2014  . Hyperlipidemia 11/20/2014  . BPH (benign prostatic hyperplasia) 05/22/2014  . Rheumatoid arthritis involving multiple sites with positive rheumatoid factor ([place]) 01/05/2013  . Chronic pain syndrome 01/05/2013    Past Medical History:  Diagnosis Date  . Anemia   . Basal cell carcinoma   . BPH (benign prostatic hyperplasia)   . GERD (gastroesophageal reflux disease)   . Melanoma ([place])   . Osteoarthritis   . Rheumatoid aortitis     Family History  Problem Relation Age of Onset  . Rheum arthritis Mother   . Emphysema Father   . Cancer Brother   . Emphysema Sister    Past Surgical History:  Procedure Laterality Date  . COLONOSCOPY    . HERNIA REPAIR    . JOINT REPLACEMENT    . MELANOMA EXCISION     Social History   Social History Narrative  . No narrative on file     Objective: Vital Signs:  BP 120/80   Pulse 82   Resp 14   Ht 5\' 8"  (1.727 m)   Wt 184 lb (83.5 kg)   BMI 27.98 kg/m    Physical Exam  Constitutional: He is oriented to person, place, and time. He appears well-developed and well-nourished.  HENT:  Head: Normocephalic and atraumatic.  Eyes: Conjunctivae and EOM are normal. Pupils are equal, round,  and reactive to light.  Neck: Normal range of motion. Neck supple.  Cardiovascular: Normal rate, regular rhythm and normal heart sounds.   Pulmonary/Chest: Effort normal and breath sounds normal.  Abdominal: Soft. Bowel sounds are normal.  Neurological: He is alert and oriented to person, place, and time.  Skin: Skin is warm and dry. Capillary refill takes less than 2 seconds.  Psychiatric: He has a normal mood and affect. His behavior is normal.  Nursing note and vitals reviewed.    Musculoskeletal Exam: C-spine and thoracic lumbar spine good range of motion. He shoulder joint abduction is limited to 90. He has some contracture in his bilateral elbows. He is very limited range of motion his bilateral wrist joints. He has subluxation of all of his MCP joints PIP/DIP joints. With overcrowding of his fingers. There was no synovitis noted. Hip joints knee joints ankles MTPs PIPs with good range of motion. His bilateral total knees are replaced. No synovitis in any of the joints was noted.  CDAI Exam: CDAI Homunculus Exam:   Joint Counts:  CDAI Tender Joint count: 0 CDAI Swollen Joint count: 0  Global Assessments:  Patient Global Assessment: 6 Provider Global Assessment: 6  CDAI Calculated Score: 12    Investigation: No additional findings.   Imaging: No results found.  Speciality Comments: No specialty comments available. CBC    Component Value Date/Time   WBC 6.2 11/24/2016 0958   RBC 3.66 (L) 11/24/2016 0958   HGB 11.9 (L) 11/24/2016 0958   HCT 36.0 (L) 11/24/2016 0958   PLT 196 11/24/2016 0958   MCV 98.4 11/24/2016 0958   MCH 32.5 11/24/2016 0958   MCHC 33.1 11/24/2016 0958   RDW 14.7 11/24/2016 0958   LYMPHSABS 2,666 11/24/2016 0958   MONOABS 620 11/24/2016 0958   EOSABS 124 11/24/2016 0958   BASOSABS 0 11/24/2016 0958   CMP     Component Value Date/Time   NA 141 11/24/2016 0958   NA 141 08/18/2016 0849   K 4.2 11/24/2016 0958   CL 107 11/24/2016 0958    CO2 27 11/24/2016 0958   GLUCOSE 107 (H) 11/24/2016 0958   BUN 13 11/24/2016 0958   BUN 17 08/18/2016 0849   CREATININE 0.89 11/24/2016 0958   CALCIUM 8.9 11/24/2016 0958   PROT 6.5 11/24/2016 0958   PROT 7.1 08/18/2016 0849   ALBUMIN 3.8 11/24/2016 0958   ALBUMIN 4.4 08/18/2016 0849   AST 16 11/24/2016 0958   ALT 9 11/24/2016 0958   ALKPHOS 69 11/24/2016 0958   BILITOT 1.1 11/24/2016 0958   BILITOT 0.6 08/18/2016 0849   GFRNONAA 83 11/24/2016 0958   GFRAA >89 11/24/2016 0958     Procedures:  No procedures performed Allergies: Morphine and related   Assessment / Plan:     Visit Diagnoses: Rheumatoid arthritis involving multiple sites with positive rheumatoid factor (HCC) - Positive rheumatoid factor, positive anti-CCP, severe erosive disease with nodulosis and contractures. He has multiple contractures as described above although he does not have any synovitis on examination. He is tolerating methotrexate well.  High risk medication use - Methotrexate 6  tablets by mouth every week, folic acid 1 mg by mouth daily. His labs are stable we'll check his labs today and then every 3 months to monitor for drug toxicity.  History of total knee replacement, bilateral: Doing well  Allergies: I've advised him to schedule an appointment with the eye doctor.  Melanoma of skin ([place])  Prediabetes  Other hyperlipidemia  Chronic pain syndrome    Orders: No orders of the defined types were placed in this encounter.  No orders of the defined types were placed in this encounter.   Face-to-face time spent with patient was 30 minutes. 50% of time was spent in counseling and coordination of care.  Follow-Up Instructions: Return in about 5 months (around 07/21/2017) for Rheumatoid arthritis.   Bo Merino, MD  Note - This record has been created using Editor, commissioning.  Chart creation errors have been sought, but may not always  have been located. Such creation errors do not  reflect on  the standard of medical care.

## 2017-02-18 ENCOUNTER — Other Ambulatory Visit: Payer: Self-pay | Admitting: Pharmacist

## 2017-02-18 ENCOUNTER — Ambulatory Visit (INDEPENDENT_AMBULATORY_CARE_PROVIDER_SITE_OTHER): Payer: PPO | Admitting: Rheumatology

## 2017-02-18 ENCOUNTER — Encounter: Payer: Self-pay | Admitting: Rheumatology

## 2017-02-18 VITALS — BP 120/80 | HR 82 | Resp 14 | Ht 68.0 in | Wt 184.0 lb

## 2017-02-18 DIAGNOSIS — Z79899 Other long term (current) drug therapy: Secondary | ICD-10-CM

## 2017-02-18 DIAGNOSIS — R7303 Prediabetes: Secondary | ICD-10-CM

## 2017-02-18 DIAGNOSIS — G894 Chronic pain syndrome: Secondary | ICD-10-CM | POA: Diagnosis not present

## 2017-02-18 DIAGNOSIS — E784 Other hyperlipidemia: Secondary | ICD-10-CM | POA: Diagnosis not present

## 2017-02-18 DIAGNOSIS — M0579 Rheumatoid arthritis with rheumatoid factor of multiple sites without organ or systems involvement: Secondary | ICD-10-CM | POA: Diagnosis not present

## 2017-02-18 DIAGNOSIS — C439 Malignant melanoma of skin, unspecified: Secondary | ICD-10-CM | POA: Diagnosis not present

## 2017-02-18 DIAGNOSIS — Z96653 Presence of artificial knee joint, bilateral: Secondary | ICD-10-CM | POA: Diagnosis not present

## 2017-02-18 DIAGNOSIS — E7849 Other hyperlipidemia: Secondary | ICD-10-CM

## 2017-02-18 LAB — CBC WITH DIFFERENTIAL/PLATELET
BASOS ABS: 57 {cells}/uL (ref 0–200)
Basophils Relative: 1 %
EOS PCT: 3 %
Eosinophils Absolute: 171 cells/uL (ref 15–500)
HCT: 38.3 % — ABNORMAL LOW (ref 38.5–50.0)
Hemoglobin: 12.5 g/dL — ABNORMAL LOW (ref 13.2–17.1)
Lymphocytes Relative: 51 %
Lymphs Abs: 2907 cells/uL (ref 850–3900)
MCH: 32.1 pg (ref 27.0–33.0)
MCHC: 32.6 g/dL (ref 32.0–36.0)
MCV: 98.2 fL (ref 80.0–100.0)
MONOS PCT: 6 %
MPV: 9.4 fL (ref 7.5–12.5)
Monocytes Absolute: 342 cells/uL (ref 200–950)
Neutro Abs: 2223 cells/uL (ref 1500–7800)
Neutrophils Relative %: 39 %
PLATELETS: 198 10*3/uL (ref 140–400)
RBC: 3.9 MIL/uL — AB (ref 4.20–5.80)
RDW: 14.6 % (ref 11.0–15.0)
WBC: 5.7 10*3/uL (ref 3.8–10.8)

## 2017-02-18 LAB — COMPLETE METABOLIC PANEL WITH GFR
ALT: 14 U/L (ref 9–46)
AST: 20 U/L (ref 10–35)
Albumin: 4.3 g/dL (ref 3.6–5.1)
Alkaline Phosphatase: 76 U/L (ref 40–115)
BUN: 18 mg/dL (ref 7–25)
CO2: 28 mmol/L (ref 20–31)
Calcium: 9.3 mg/dL (ref 8.6–10.3)
Chloride: 102 mmol/L (ref 98–110)
Creat: 0.82 mg/dL (ref 0.70–1.18)
GFR, EST NON AFRICAN AMERICAN: 86 mL/min (ref >=60)
GFR, Est African American: >89 mL/min (ref >=60)
Glucose, Bld: 100 mg/dL — ABNORMAL HIGH (ref 65–99)
POTASSIUM: 4.9 mmol/L (ref 3.5–5.3)
SODIUM: 139 mmol/L (ref 135–146)
Total Bilirubin: 1 mg/dL (ref 0.2–1.2)
Total Protein: 7.1 g/dL (ref 6.1–8.1)

## 2017-02-18 NOTE — Patient Instructions (Addendum)
[facility] [place], [place] [address] [phone]  Standing Labs We placed an order today for your standing lab work.    Please come back and get your standing labs in September and every 3 months  We have open lab Monday through Friday from 8:30-11:30 AM and 1:30-4 PM at the office of Dr. Tresa Moore, PA.   The office is located at [address] [place], [place], [place] [address] No appointment is necessary.   Labs are drawn by [facility].  You may receive a bill from [facility] for your lab work. If you have any questions regarding directions or hours of operation,  please call [phone].

## 2017-02-19 NOTE — Progress Notes (Signed)
Labs stable

## 2017-03-10 DIAGNOSIS — C434 Malignant melanoma of scalp and neck: Secondary | ICD-10-CM | POA: Diagnosis not present

## 2017-03-23 ENCOUNTER — Other Ambulatory Visit: Payer: Self-pay

## 2017-03-23 DIAGNOSIS — Z79899 Other long term (current) drug therapy: Secondary | ICD-10-CM

## 2017-03-23 LAB — CBC WITH DIFFERENTIAL/PLATELET
BASOS PCT: 0 %
Basophils Absolute: 0 cells/uL (ref 0–200)
EOS ABS: 118 {cells}/uL (ref 15–500)
Eosinophils Relative: 2 %
HCT: 34 % — ABNORMAL LOW (ref 38.5–50.0)
Hemoglobin: 11.4 g/dL — ABNORMAL LOW (ref 13.2–17.1)
Lymphocytes Relative: 36 %
Lymphs Abs: 2124 cells/uL (ref 850–3900)
MCH: 32.9 pg (ref 27.0–33.0)
MCHC: 33.5 g/dL (ref 32.0–36.0)
MCV: 98 fL (ref 80.0–100.0)
MONOS PCT: 6 %
MPV: 9.5 fL (ref 7.5–12.5)
Monocytes Absolute: 354 cells/uL (ref 200–950)
NEUTROS ABS: 3304 {cells}/uL (ref 1500–7800)
Neutrophils Relative %: 56 %
PLATELETS: 207 10*3/uL (ref 140–400)
RBC: 3.47 MIL/uL — ABNORMAL LOW (ref 4.20–5.80)
RDW: 15.2 % — ABNORMAL HIGH (ref 11.0–15.0)
WBC: 5.9 10*3/uL (ref 3.8–10.8)

## 2017-03-24 LAB — COMPLETE METABOLIC PANEL WITH GFR
ALT: 12 U/L (ref 9–46)
AST: 20 U/L (ref 10–35)
Albumin: 4.2 g/dL (ref 3.6–5.1)
Alkaline Phosphatase: 68 U/L (ref 40–115)
BILIRUBIN TOTAL: 0.8 mg/dL (ref 0.2–1.2)
BUN: 16 mg/dL (ref 7–25)
CO2: 26 mmol/L (ref 20–31)
Calcium: 8.8 mg/dL (ref 8.6–10.3)
Chloride: 104 mmol/L (ref 98–110)
Creat: 0.82 mg/dL (ref 0.70–1.18)
GFR, Est African American: >89 mL/min (ref >=60)
GFR, Est Non African American: 86 mL/min (ref >=60)
GLUCOSE: 119 mg/dL — AB (ref 65–99)
Potassium: 4 mmol/L (ref 3.5–5.3)
SODIUM: 139 mmol/L (ref 135–146)
TOTAL PROTEIN: 6.7 g/dL (ref 6.1–8.1)

## 2017-03-24 NOTE — Progress Notes (Signed)
Labs are stable. He is still anemic.

## 2017-04-13 ENCOUNTER — Other Ambulatory Visit: Payer: Self-pay | Admitting: Rheumatology

## 2017-04-13 NOTE — Telephone Encounter (Signed)
Last Visit: 02/18/17 Next Visit: 07/01/17 Labs: 03/23/17 Stable   Okay to refill per Dr. Estanislado Pandy

## 2017-04-20 ENCOUNTER — Ambulatory Visit (INDEPENDENT_AMBULATORY_CARE_PROVIDER_SITE_OTHER): Payer: PPO | Admitting: Otolaryngology

## 2017-04-20 DIAGNOSIS — H6123 Impacted cerumen, bilateral: Secondary | ICD-10-CM

## 2017-05-04 ENCOUNTER — Other Ambulatory Visit: Payer: Self-pay | Admitting: Rheumatology

## 2017-05-05 NOTE — Telephone Encounter (Signed)
Last Visit: 02/18/17 Next Visit: 07/01/17  Okay to refill per Dr. Estanislado Pandy

## 2017-05-06 DIAGNOSIS — Z08 Encounter for follow-up examination after completed treatment for malignant neoplasm: Secondary | ICD-10-CM | POA: Diagnosis not present

## 2017-05-06 DIAGNOSIS — X32XXXD Exposure to sunlight, subsequent encounter: Secondary | ICD-10-CM | POA: Diagnosis not present

## 2017-05-06 DIAGNOSIS — D485 Neoplasm of uncertain behavior of skin: Secondary | ICD-10-CM | POA: Diagnosis not present

## 2017-05-06 DIAGNOSIS — Z1283 Encounter for screening for malignant neoplasm of skin: Secondary | ICD-10-CM | POA: Diagnosis not present

## 2017-05-06 DIAGNOSIS — C4441 Basal cell carcinoma of skin of scalp and neck: Secondary | ICD-10-CM | POA: Diagnosis not present

## 2017-05-06 DIAGNOSIS — L905 Scar conditions and fibrosis of skin: Secondary | ICD-10-CM | POA: Diagnosis not present

## 2017-05-06 DIAGNOSIS — Z85820 Personal history of malignant melanoma of skin: Secondary | ICD-10-CM | POA: Diagnosis not present

## 2017-05-06 DIAGNOSIS — D225 Melanocytic nevi of trunk: Secondary | ICD-10-CM | POA: Diagnosis not present

## 2017-05-06 DIAGNOSIS — L570 Actinic keratosis: Secondary | ICD-10-CM | POA: Diagnosis not present

## 2017-05-13 ENCOUNTER — Other Ambulatory Visit: Payer: Self-pay | Admitting: Family Medicine

## 2017-06-10 ENCOUNTER — Ambulatory Visit (INDEPENDENT_AMBULATORY_CARE_PROVIDER_SITE_OTHER): Payer: PPO

## 2017-06-10 DIAGNOSIS — Z23 Encounter for immunization: Secondary | ICD-10-CM
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
## 2017-06-23 ENCOUNTER — Other Ambulatory Visit: Payer: Self-pay

## 2017-06-23 DIAGNOSIS — Z79899 Other long term (current) drug therapy: Secondary | ICD-10-CM

## 2017-06-23 LAB — CBC WITH DIFFERENTIAL/PLATELET
BASOS ABS: 29 {cells}/uL (ref 0–200)
Basophils Relative: 0.5 %
Eosinophils Absolute: 58 cells/uL (ref 15–500)
Eosinophils Relative: 1 %
HEMATOCRIT: 35.3 % — AB (ref 38.5–50.0)
Hemoglobin: 12.2 g/dL — ABNORMAL LOW (ref 13.2–17.1)
LYMPHS ABS: 2366 {cells}/uL (ref 850–3900)
MCH: 33.2 pg — ABNORMAL HIGH (ref 27.0–33.0)
MCHC: 34.6 g/dL (ref 32.0–36.0)
MCV: 96.2 fL (ref 80.0–100.0)
MPV: 10 fL (ref 7.5–12.5)
Monocytes Relative: 6.9 %
Neutro Abs: 2946 cells/uL (ref 1500–7800)
Neutrophils Relative %: 50.8 %
Platelets: 201 10*3/uL (ref 140–400)
RBC: 3.67 10*6/uL — ABNORMAL LOW (ref 4.20–5.80)
RDW: 13.8 % (ref 11.0–15.0)
Total Lymphocyte: 40.8 %
WBC: 5.8 10*3/uL (ref 3.8–10.8)
WBCMIX: 400 {cells}/uL (ref 200–950)

## 2017-07-01 ENCOUNTER — Encounter: Payer: Self-pay | Admitting: Rheumatology

## 2017-07-01 ENCOUNTER — Ambulatory Visit (INDEPENDENT_AMBULATORY_CARE_PROVIDER_SITE_OTHER): Payer: PPO | Admitting: Rheumatology

## 2017-07-01 VITALS — BP 153/58 | HR 54 | Resp 18 | Ht 68.0 in | Wt 188.0 lb

## 2017-07-01 DIAGNOSIS — R7303 Prediabetes: Secondary | ICD-10-CM | POA: Diagnosis not present

## 2017-07-01 DIAGNOSIS — E7849 Other hyperlipidemia: Secondary | ICD-10-CM

## 2017-07-01 DIAGNOSIS — Z85828 Personal history of other malignant neoplasm of skin: Secondary | ICD-10-CM

## 2017-07-01 DIAGNOSIS — Z85820 Personal history of malignant melanoma of skin: Secondary | ICD-10-CM

## 2017-07-01 DIAGNOSIS — Z79899 Other long term (current) drug therapy: Secondary | ICD-10-CM

## 2017-07-01 DIAGNOSIS — Z96653 Presence of artificial knee joint, bilateral: Secondary | ICD-10-CM | POA: Diagnosis not present

## 2017-07-01 DIAGNOSIS — K219 Gastro-esophageal reflux disease without esophagitis: Secondary | ICD-10-CM

## 2017-07-01 DIAGNOSIS — Z862 Personal history of diseases of the blood and blood-forming organs and certain disorders involving the immune mechanism: Secondary | ICD-10-CM

## 2017-07-01 DIAGNOSIS — M0579 Rheumatoid arthritis with rheumatoid factor of multiple sites without organ or systems involvement: Secondary | ICD-10-CM | POA: Diagnosis not present

## 2017-07-01 DIAGNOSIS — G894 Chronic pain syndrome: Secondary | ICD-10-CM | POA: Diagnosis not present

## 2017-07-01 LAB — COMPLETE METABOLIC PANEL WITH GFR
AG Ratio: 1.7 (calc) (ref 1.0–2.5)
ALBUMIN MSPROF: 4.3 g/dL (ref 3.6–5.1)
ALT: 15 U/L (ref 9–46)
AST: 26 U/L (ref 10–35)
Alkaline phosphatase (APISO): 66 U/L (ref 40–115)
BUN: 18 mg/dL (ref 7–25)
CALCIUM: 9.2 mg/dL (ref 8.6–10.3)
CO2: 23 mmol/L (ref 20–32)
CREATININE: 0.74 mg/dL (ref 0.70–1.18)
Chloride: 106 mmol/L (ref 98–110)
GFR, EST NON AFRICAN AMERICAN: 90 mL/min/{1.73_m2} (ref >=60)
GFR, Est African American: 104 mL/min/{1.73_m2} (ref >=60)
GLOBULIN: 2.5 g/dL (ref 1.9–3.7)
Glucose, Bld: 101 mg/dL — ABNORMAL HIGH (ref 65–99)
Potassium: 4.6 mmol/L (ref 3.5–5.3)
SODIUM: 139 mmol/L (ref 135–146)
TOTAL PROTEIN: 6.8 g/dL (ref 6.1–8.1)
Total Bilirubin: 0.8 mg/dL (ref 0.2–1.2)

## 2017-07-01 NOTE — Patient Instructions (Signed)
Standing Labs We placed an order today for your standing lab work.    Please come back and get your standing labs(CBC, CMP with GFR) in January and every 3 months  We have open lab Monday through Friday from 8:30-11:30 AM and 1:30-4 PM at the office of Dr. Clydene Burack.   The office is located at [address], [address], [place], [place] [address] No appointment is necessary.   Labs are drawn by [facility].  You may receive a bill from [facility] for your lab work. If you have any questions regarding directions or hours of operation,  please call [phone].    

## 2017-07-08 ENCOUNTER — Other Ambulatory Visit: Payer: Self-pay | Admitting: Family Medicine

## 2017-07-08 ENCOUNTER — Other Ambulatory Visit: Payer: Self-pay | Admitting: Rheumatology

## 2017-07-08 NOTE — Telephone Encounter (Signed)
Last Visit: 07/01/17 Next Visit: 12/02/17 Labs: 07/01/17 WNL  Okay to refill per Dr. Estanislado Pandy

## 2017-07-11 NOTE — Telephone Encounter (Signed)
May have #15 needs ov because of controlled med

## 2017-07-14 DIAGNOSIS — H53002 Unspecified amblyopia, left eye: Secondary | ICD-10-CM | POA: Diagnosis not present

## 2017-07-14 DIAGNOSIS — H5203 Hypermetropia, bilateral: Secondary | ICD-10-CM | POA: Diagnosis not present

## 2017-07-14 DIAGNOSIS — H2513 Age-related nuclear cataract, bilateral: Secondary | ICD-10-CM | POA: Diagnosis not present

## 2017-07-14 DIAGNOSIS — H524 Presbyopia: Secondary | ICD-10-CM | POA: Diagnosis not present

## 2017-07-22 ENCOUNTER — Ambulatory Visit: Payer: PPO | Admitting: Rheumatology

## 2017-08-27 ENCOUNTER — Ambulatory Visit (HOSPITAL_COMMUNITY)
Admission: RE | Admit: 2017-08-27 | Discharge: 2017-08-27 | Disposition: A | Discharge status: Home / Self Care | Payer: PPO | Source: Ambulatory Visit | Attending: Family Medicine | Admitting: Family Medicine

## 2017-08-27 ENCOUNTER — Other Ambulatory Visit (HOSPITAL_COMMUNITY)
Admission: RE | Admit: 2017-08-27 | Discharge: 2017-08-27 | Disposition: A | Discharge status: Home / Self Care | Payer: PPO | Source: Ambulatory Visit | Attending: Family Medicine | Admitting: Family Medicine

## 2017-08-27 ENCOUNTER — Encounter: Payer: Self-pay | Admitting: Family Medicine

## 2017-08-27 ENCOUNTER — Ambulatory Visit: Payer: PPO | Admitting: Family Medicine

## 2017-08-27 VITALS — BP 130/66 | Ht 67.0 in | Wt 188.1 lb

## 2017-08-27 DIAGNOSIS — M7731 Calcaneal spur, right foot: Secondary | ICD-10-CM | POA: Insufficient documentation

## 2017-08-27 DIAGNOSIS — M25471 Effusion, right ankle: Secondary | ICD-10-CM | POA: Insufficient documentation

## 2017-08-27 DIAGNOSIS — M069 Rheumatoid arthritis, unspecified: Secondary | ICD-10-CM | POA: Insufficient documentation

## 2017-08-27 DIAGNOSIS — S99911A Unspecified injury of right ankle, initial encounter: Secondary | ICD-10-CM | POA: Diagnosis not present

## 2017-08-27 DIAGNOSIS — R600 Localized edema: Secondary | ICD-10-CM

## 2017-08-27 DIAGNOSIS — R936 Abnormal findings on diagnostic imaging of limbs: Secondary | ICD-10-CM | POA: Insufficient documentation

## 2017-08-27 DIAGNOSIS — M19071 Primary osteoarthritis, right ankle and foot: Secondary | ICD-10-CM | POA: Insufficient documentation

## 2017-08-27 DIAGNOSIS — S99921A Unspecified injury of right foot, initial encounter: Secondary | ICD-10-CM | POA: Diagnosis not present

## 2017-08-27 DIAGNOSIS — M7989 Other specified soft tissue disorders: Secondary | ICD-10-CM | POA: Diagnosis not present

## 2017-08-27 LAB — D-DIMER, QUANTITATIVE: D-Dimer, Quant: 2.13 ug/mL-FEU — ABNORMAL HIGH (ref 0.00–0.50)

## 2017-08-27 MED ORDER — CEPHALEXIN 500 MG PO CAPS: ORAL_CAPSULE | ORAL | 0 refills | Status: DC

## 2017-08-27 NOTE — Progress Notes (Signed)
   Subjective:    Patient ID: Bryan Homans Sr., male    DOB: 15-Jun-1941, 76 y.o.   MRN: [id number]  HPI Patient here today after a fall,He states he fell a week ago,he is experiencing someright ankle pain and edema.  Testing patient has loose upper tooth.  He has rheumatoid arthritis.  More anxiety now.  Has had a couple falls  Twisted turn left ankle.  States overall feeling better now.  Twisted and turned right foot and ankle.  Developed substantial swelling ankle and right leg.  Also has developed redness and tenderness of the right foot.  No obvious fever or chills.  No history of DVTs.  No shortness of breath no chest pain  Review of Systems     Objective:   Physical Exam  Alert and oriented, vitals reviewed and stable, NAD ENT-TM's and ext canals WNL bilat via otoscopic exam Soft palate, tonsils and post pharynx WNL via oropharyngeal exam Neck-symmetric, no masses; thyroid nonpalpable and nontender Pulmonary-no tachypnea or accessory muscle use; Clear without wheezes via auscultation Card--no abnrml murmurs, rhythm reg and rate WNL Carotid pulses symmetric, without bruits   Present right leg edema positive right foot dorsal erythema and tenderness to palpation positive hematoma positive malleoli tenderness      Assessment & Plan:  Impression right ankle foot injury with concern for multiple difficulties.  Diffuse erythema and significant tenderness raises concern for cellulitis.  This will be covered with antibiotics.  Substantial foot and ankle pain x-rays.  X-rays were ordered. X-rays negative also right leg ultrasound ordered.  D-dimer elevated.  Ultrasound negative.  Will repeat d-dimer next week.  Expect slow resolution  Greater than 50% of this 25 minute face to face visit was spent in counseling and discussion and coordination of care regarding the above diagnosis/diagnosies

## 2017-08-28 ENCOUNTER — Other Ambulatory Visit: Payer: Self-pay

## 2017-08-28 DIAGNOSIS — R600 Localized edema: Secondary | ICD-10-CM

## 2017-08-28 DIAGNOSIS — R7989 Other specified abnormal findings of blood chemistry: Secondary | ICD-10-CM

## 2017-09-02 ENCOUNTER — Telehealth: Payer: Self-pay

## 2017-09-02 ENCOUNTER — Ambulatory Visit (HOSPITAL_COMMUNITY)
Admission: RE | Admit: 2017-09-02 | Discharge: 2017-09-02 | Disposition: A | Discharge status: Home / Self Care | Payer: PPO | Source: Ambulatory Visit | Attending: Family Medicine | Admitting: Family Medicine

## 2017-09-02 DIAGNOSIS — R600 Localized edema: Secondary | ICD-10-CM | POA: Insufficient documentation

## 2017-09-02 DIAGNOSIS — R7989 Other specified abnormal findings of blood chemistry: Secondary | ICD-10-CM | POA: Insufficient documentation

## 2017-09-02 DIAGNOSIS — M7121 Synovial cyst of popliteal space [Baker], right knee: Secondary | ICD-10-CM | POA: Insufficient documentation

## 2017-09-04 ENCOUNTER — Encounter: Payer: Self-pay | Admitting: Family Medicine

## 2017-09-04 ENCOUNTER — Ambulatory Visit (INDEPENDENT_AMBULATORY_CARE_PROVIDER_SITE_OTHER): Payer: PPO | Admitting: Family Medicine

## 2017-09-04 VITALS — BP 108/70 | Ht 67.0 in | Wt 185.0 lb

## 2017-09-04 DIAGNOSIS — Z13220 Encounter for screening for lipoid disorders: Secondary | ICD-10-CM

## 2017-09-04 DIAGNOSIS — Z79899 Other long term (current) drug therapy: Secondary | ICD-10-CM | POA: Diagnosis not present

## 2017-09-04 DIAGNOSIS — R600 Localized edema: Secondary | ICD-10-CM | POA: Diagnosis not present

## 2017-09-04 DIAGNOSIS — Z0000 Encounter for general adult medical examination without abnormal findings: Secondary | ICD-10-CM | POA: Diagnosis not present

## 2017-09-04 DIAGNOSIS — M2550 Pain in unspecified joint: Secondary | ICD-10-CM | POA: Diagnosis not present

## 2017-09-04 MED ORDER — FUROSEMIDE 20 MG PO TABS: ORAL_TABLET | ORAL | 5 refills | Status: DC

## 2017-09-04 MED ORDER — POTASSIUM CHLORIDE CRYS ER 20 MEQ PO TBCR: 20.0000 meq | EXTENDED_RELEASE_TABLET | Freq: Every day | ORAL | 5 refills | Status: DC

## 2017-09-04 MED ORDER — ACETAMINOPHEN-CODEINE #3 300-30 MG PO TABS: ORAL_TABLET | ORAL | 0 refills | Status: DC

## 2017-09-04 NOTE — Progress Notes (Signed)
Subjective:    Patient ID: Bryan Homans Sr., male    DOB: 10/29/1940, 77 y.o.   MRN: [id number]  HPI AWV- Annual Wellness Visit  The patient was seen for their annual wellness visit. The patient's past medical history, surgical history, and family history were reviewed. Pertinent vaccines were reviewed ( tetanus, pneumonia, shingles, flu) The patient's medication list was reviewed and updated.  The height and weight were entered. The patient's current BMI is: 28.98  Cognitive screening was completed. Outcome of Mini - Cog:   Falls within the past 6 months: fell 2 times. One time fell out of the bed and the other while pushing a cart at the [employer]. Was seen after the fall by dr Richardson Landry.   Current tobacco usage: none (All patients who use tobacco were given written and verbal information on quitting)  Recent listing of emergency department/hospitalizations over the past year were reviewed.  current specialist the patient sees on a regular basis: Dr. Junious Silk - [facility], ENT - Otho Najjar in [place], [place] Dr. Gershon Crane, [facility] ortho - Deveshwar   Medicare annual wellness visit patient questionnaire was reviewed.  A written screening schedule for the patient for the next 5-10 years was given. Appropriate discussion of followup regarding next visit was discussed.       Review of Systems  Constitutional: Negative for activity change, appetite change and fever.  HENT: Negative for congestion and rhinorrhea.   Eyes: Negative for discharge.  Respiratory: Negative for cough and wheezing.   Cardiovascular: Negative for chest pain.  Gastrointestinal: Negative for abdominal pain, blood in stool and vomiting.  Genitourinary: Negative for difficulty urinating and frequency.  Musculoskeletal: Negative for neck pain.  Skin: Negative for rash.  Allergic/Immunologic: Negative for environmental allergies and food allergies.  Neurological: Negative for weakness and headaches.    Psychiatric/Behavioral: Negative for agitation.       Objective:   Physical Exam  Constitutional: He appears well-developed and well-nourished.  HENT:  Head: Normocephalic and atraumatic.  Right Ear: External ear normal.  Left Ear: External ear normal.  Nose: Nose normal.  Mouth/Throat: Oropharynx is clear and moist.  Eyes: EOM are normal. Pupils are equal, round, and reactive to light.  Neck: Normal range of motion. Neck supple. No thyromegaly present.  Cardiovascular: Normal rate, regular rhythm and normal heart sounds.  No murmur heard. Pulmonary/Chest: Effort normal and breath sounds normal. No respiratory distress. He has no wheezes.  Abdominal: Soft. Bowel sounds are normal. He exhibits no distension and no mass. There is no tenderness.  Genitourinary: Penis normal.  Musculoskeletal: Normal range of motion. He exhibits no edema.  Lymphadenopathy:    He has no cervical adenopathy.  Neurological: He is alert. He exhibits normal muscle tone.  Skin: Skin is warm and dry. No erythema.  Psychiatric: He has a normal mood and affect. His behavior is normal. Judgment normal.   The patient is noted significant pedal edema into the lower legs has been present over the past several months denies any PND denies orthopnea he is limited in his range of motion and activity because of his severe arthritis  Patient does suffer with severe arthritis and intermittently does have to take Tylenol with codeine to help with the pain it does help him significantly he denies use of it he is unable to take other medications because of his age kidney function       Assessment & Plan:  Adult wellness-complete.wellness physical was conducted today. Importance of diet and  exercise were discussed in detail. In addition to this a discussion regarding safety was also covered. We also reviewed over immunizations and gave recommendations regarding current immunization needed for age. In addition to this  additional areas were also touched on including: Preventative health exams needed: Colonoscopy patient had a colonoscopy approximately 2-3 years ago  Patient was advised yearly wellness exam  Pedal edema- Lasix 20 mg 1 every morning as needed, potassium 20 mEq 1 every morning when taking the Lasix, follow-up in 6 months  Significant arthralgias with severe end-stage arthritis medications do not do enough to alleviate his pain therefore he uses Tylenol 3 sparingly to help him with severe pain he does not abuse it the drug registry was checked 3 prescriptions were written for the patient

## 2017-09-07 DIAGNOSIS — Z79899 Other long term (current) drug therapy: Secondary | ICD-10-CM | POA: Diagnosis not present

## 2017-09-07 DIAGNOSIS — R600 Localized edema: Secondary | ICD-10-CM | POA: Diagnosis not present

## 2017-09-07 DIAGNOSIS — Z13220 Encounter for screening for lipoid disorders: Secondary | ICD-10-CM | POA: Diagnosis not present

## 2017-09-08 ENCOUNTER — Encounter: Payer: Self-pay | Admitting: Family Medicine

## 2017-09-08 LAB — BASIC METABOLIC PANEL
BUN / CREAT RATIO: 16 (ref 10–24)
BUN: 13 mg/dL (ref 8–27)
CALCIUM: 9.3 mg/dL (ref 8.6–10.2)
CO2: 26 mmol/L (ref 20–29)
Chloride: 101 mmol/L (ref 96–106)
Creatinine, Ser: 0.79 mg/dL (ref 0.76–1.27)
GFR calc Af Amer: 100 mL/min/{1.73_m2} (ref >59)
GFR calc non Af Amer: 87 mL/min/{1.73_m2} (ref >59)
Glucose: 117 mg/dL — ABNORMAL HIGH (ref 65–99)
Potassium: 4.9 mmol/L (ref 3.5–5.2)
Sodium: 142 mmol/L (ref 134–144)

## 2017-09-08 LAB — LIPID PANEL
CHOLESTEROL TOTAL: 174 mg/dL (ref 100–199)
Chol/HDL Ratio: 3.5 ratio (ref 0.0–5.0)
HDL: 50 mg/dL (ref >39)
LDL Calculated: 101 mg/dL — ABNORMAL HIGH (ref 0–99)
Triglycerides: 117 mg/dL (ref 0–149)
VLDL CHOLESTEROL CAL: 23 mg/dL (ref 5–40)

## 2017-09-15 ENCOUNTER — Encounter (HOSPITAL_COMMUNITY): Payer: Self-pay

## 2017-09-15 ENCOUNTER — Emergency Department (HOSPITAL_COMMUNITY)
Admission: EM | Admit: 2017-09-15 | Discharge: 2017-09-15 | Disposition: A | Discharge status: Home / Self Care | Payer: PPO | Attending: Emergency Medicine | Admitting: Emergency Medicine

## 2017-09-15 ENCOUNTER — Other Ambulatory Visit: Payer: Self-pay

## 2017-09-15 DIAGNOSIS — Z85828 Personal history of other malignant neoplasm of skin: Secondary | ICD-10-CM | POA: Insufficient documentation

## 2017-09-15 DIAGNOSIS — Z85820 Personal history of malignant melanoma of skin: Secondary | ICD-10-CM | POA: Diagnosis not present

## 2017-09-15 DIAGNOSIS — Z79899 Other long term (current) drug therapy: Secondary | ICD-10-CM | POA: Insufficient documentation

## 2017-09-15 DIAGNOSIS — R040 Epistaxis: Secondary | ICD-10-CM | POA: Diagnosis not present

## 2017-09-15 DIAGNOSIS — Z7982 Long term (current) use of aspirin: Secondary | ICD-10-CM | POA: Insufficient documentation

## 2017-09-15 DIAGNOSIS — D649 Anemia, unspecified: Secondary | ICD-10-CM | POA: Diagnosis not present

## 2017-09-15 DIAGNOSIS — Z96653 Presence of artificial knee joint, bilateral: Secondary | ICD-10-CM | POA: Insufficient documentation

## 2017-09-15 LAB — CBC
HEMATOCRIT: 33.5 % — AB (ref 39.0–52.0)
HEMOGLOBIN: 10.8 g/dL — AB (ref 13.0–17.0)
MCH: 32 pg (ref 26.0–34.0)
MCHC: 32.2 g/dL (ref 30.0–36.0)
MCV: 99.4 fL (ref 78.0–100.0)
Platelets: 198 10*3/uL (ref 150–400)
RBC: 3.37 MIL/uL — ABNORMAL LOW (ref 4.22–5.81)
RDW: 13.9 % (ref 11.5–15.5)
WBC: 5.2 10*3/uL (ref 4.0–10.5)

## 2017-09-15 MED ORDER — SILVER NITRATE-POT NITRATE 75-25 % EX MISC
1.0000 | Freq: Once | CUTANEOUS | Status: AC
Start: 2017-09-15 — End: 2017-09-15
  Administered 2017-09-15: 1 via TOPICAL
  Filled 2017-09-15: qty 1

## 2017-09-15 MED ORDER — LIDOCAINE HCL (PF) 2 % IJ SOLN
INTRAMUSCULAR | Status: AC
  Administered 2017-09-15: 5 mL
  Filled 2017-09-15: qty 10

## 2017-09-15 MED ORDER — LIDOCAINE HCL (PF) 2 % IJ SOLN
10.0000 mL | Freq: Once | INTRAMUSCULAR | Status: AC
  Administered 2017-09-15: 5 mL

## 2017-09-15 NOTE — Discharge Instructions (Signed)
Your hemoglobin is 10.8 today.  Follow-up with Dr. Wolfgang Phoenix for further management of your hemoglobin.  Return for uncontrolled bleeding from your nose.  Follow-up with your ear nose and throat doctor for removal of the balloon in 2-3 days.

## 2017-09-15 NOTE — ED Triage Notes (Signed)
Pt presented with nosebleed (right nare) that started prior to coming to ED. Pt does have active bleeding. Rolled up gauze placed in right nare with instructions to apply pressure to bridge of nose.

## 2017-09-15 NOTE — ED Provider Notes (Signed)
[facility] EMERGENCY DEPARTMENT Provider Note   CSN: [id number] Arrival date & time: 09/15/17  1940     History   Chief Complaint Chief Complaint  Patient presents with  . Epistaxis    HPI Bryan NOYOLA Sr. is a 77 y.o. male.  HPI Patient presents with a nosebleed.  Comes out of his right side began earlier today.  No other bleeding.  Has been holding the his nose.  Has been coming out both the front and the back.  He is not on anticoagulation.  States she has felt a little lightheaded.  States he has a history of anemia.  History of rheumatoid arthritis.  No trauma.  States he has been sneezing today prior to the bleeding starting. Past Medical History:  Diagnosis Date  . Anemia   . Basal cell carcinoma   . BPH (benign prostatic hyperplasia)   . GERD (gastroesophageal reflux disease)   . Melanoma ([place])   . Osteoarthritis   . Rheumatoid aortitis     Patient Active Problem List   Diagnosis Date Noted  . High risk medication use 09/19/2016  . History of total knee replacement, bilateral 09/19/2016  . Gastroesophageal reflux disease without esophagitis 09/19/2016  . History of melanoma 09/19/2016  . History of basal cell carcinoma 09/19/2016  . History of anemia 09/19/2016  . Allergic rhinitis 02/18/2016  . Melanoma of skin ([place]) 06/06/2015  . Prediabetes 11/20/2014  . Hyperlipidemia 11/20/2014  . BPH (benign prostatic hyperplasia) 05/22/2014  . Rheumatoid arthritis involving multiple sites with positive rheumatoid factor ([place]) 01/05/2013  . Chronic pain syndrome 01/05/2013    Past Surgical History:  Procedure Laterality Date  . COLONOSCOPY    . HERNIA REPAIR    . JOINT REPLACEMENT    . MELANOMA EXCISION         Home Medications    Prior to Admission medications   Medication Sig Start Date End Date Taking? Authorizing Provider  Acetaminophen (TYLENOL ARTHRITIS EXT RELIEF PO) Take 1-2 tablets by mouth daily as needed (for pain).   Yes [provider]  acetaminophen-codeine (TYLENOL #3) 300-30 MG tablet TAKE ONE TABLET BY MOUTH DAILY AS NEEDED FOR ARTHTRITIS PAIN ** MAY CAUSE DROWSINESS ** 09/04/17  Yes Kathyrn Drown, MD  aspirin 81 MG chewable tablet Chew 81 mg by mouth daily.   Yes [provider]  b complex vitamins tablet Take 1 tablet by mouth daily.   Yes [provider]  beta carotene w/minerals (OCUVITE) tablet Take 1 tablet by mouth daily.   Yes [provider]  Calcium-Magnesium-Zinc [phone] MG TABS Take 3 tablets by mouth daily.   Yes [provider]  Ferrous Sulfate (IRON) 325 (65 FE) MG TABS Take 1 tablet by mouth daily.    Yes [provider]  folic acid (FOLVITE) 1 MG tablet TAKE TWO TABLETS BY MOUTH ONCE DAILY (  NEEDS  APPOINTMENT) Patient taking differently: TAKE TWO TABLETS BY MOUTH ONCE DAILY 05/05/17  Yes Deveshwar, Abel Presto, MD  furosemide (LASIX) 20 MG tablet One po Q Am prn edema Patient taking differently: Take 20 mg by mouth every morning.  09/04/17  Yes Kathyrn Drown, MD  methotrexate 2.5 MG tablet TAKE 6 TABLETS BY MOUTH ONCE A WEEK 07/08/17  Yes Deveshwar, Abel Presto, MD  Multiple Vitamins-Minerals (CENTRUM SILVER PO) Take by mouth.   Yes [provider]  Omega-3 Fatty Acids (SALMON OIL PO) Take 2,000 mg by mouth 2 (two) times daily.   Yes [provider]  omeprazole (PRILOSEC) 20 MG capsule TAKE ONE CAPSULE BY MOUTH ONCE DAILY 05/13/17  Yes Luking, Scott A, MD  potassium chloride SA (K-DUR,KLOR-CON) 20 MEQ tablet Take 1 tablet (20 mEq total) by mouth daily. 09/04/17  Yes Kathyrn Drown, MD  tamsulosin (FLOMAX) 0.4 MG CAPS capsule Take 1 capsule (0.4 mg total) by mouth at bedtime. 08/18/16  Yes Kathyrn Drown, MD  vitamin C (ASCORBIC ACID) 500 MG tablet Take 500 mg by mouth daily.   Yes [provider]  Zinc 50 MG TABS Take 1 tablet by mouth daily.    Yes [provider]    Family History Family History  Problem Relation Age of Onset  .  Rheum arthritis Mother   . Emphysema Father   . Cancer Brother   . Emphysema Sister     Social History Social History   Tobacco Use  . Smoking status: Never Smoker  . Smokeless tobacco: Never Used  Substance Use Topics  . Alcohol use: No  . Drug use: No     Allergies   Morphine and related and Acyclovir and related   Review of Systems Review of Systems  Constitutional: Negative for appetite change.  HENT: Positive for nosebleeds.   Respiratory: Negative for shortness of breath.   Cardiovascular: Negative for chest pain.  Gastrointestinal: Negative for abdominal pain.  Genitourinary: Negative for flank pain.  Musculoskeletal: Negative for back pain.  Neurological: Positive for light-headedness.  Hematological: Does not bruise/bleed easily.  Psychiatric/Behavioral: Negative for confusion.     Physical Exam Updated Vital Signs BP 140/81   Pulse 71   Temp 98.5 F (36.9 C) (Oral)   Resp 16   SpO2 98%   Physical Exam  Constitutional: He appears well-developed.  HENT:  Head: Atraumatic.  Circular lesion on scalp from previous skin cancer.  Evidence of bleeding in posterior pharynx.  Evidence of some dried blood in right nare.  Cardiovascular: Normal rate.  Pulmonary/Chest: Effort normal.  Abdominal: He exhibits no distension.  Musculoskeletal: He exhibits no edema.  Deformed hands from his rheumatoid arthritis  Neurological: He is alert.  Skin: Skin is warm.     ED Treatments / Results  Labs (all labs ordered are listed, but only abnormal results are displayed) Labs Reviewed  CBC - Abnormal; Notable for the following components:      Result Value   RBC 3.37 (*)    Hemoglobin 10.8 (*)    HCT 33.5 (*)    All other components within normal limits    EKG  EKG Interpretation None       Radiology No results found.  Procedures .Epistaxis Management Date/Time: 09/15/2017 11:33 PM Performed by: Davonna Belling, MD Authorized by: Davonna Belling, MD   Consent:    Consent obtained:  Verbal   Consent given by:  Patient   Risks discussed:  Bleeding, nasal injury and pain   Alternatives discussed:  No treatment and alternative treatment Anesthesia (see MAR for exact dosages):    Anesthesia method:  Topical application   Topical anesthesia: lidocaine. Procedure details:    Treatment site:  R anterior   Treatment method:  Silver nitrate and nasal balloon   Treatment complexity:  Limited   Treatment episode: recurring   Post-procedure details:    Assessment:  Bleeding stopped   Patient tolerance of procedure:  Tolerated well, no immediate complications Comments:     Initial silver nitrate the nasal balloon after bleeding.   (including critical care time)  Medications Ordered in ED Medications  silver nitrate applicators applicator 1 Stick (1 Stick Topical Given 09/15/17 2230)  lidocaine (XYLOCAINE) 2 % injection 10 mL (5 mLs Other Given 09/15/17 2230)     Initial Impression / Assessment and Plan / ED Course  I have reviewed the triage vital signs and the nursing notes.  Pertinent labs & imaging results that were available during my care of the patient were reviewed by me and considered in my medical decision making (see chart for details).     Patient with nosebleed.  Initially cauterized from presumed bleeding area and right nostril but resumed bleeding.  4.5 cm nasal balloon then placed with cessation of the bleeding.  Hemoglobin is slightly decreased from his baseline and will need to be followed.  Will discharge home.  Patient will follow up with his ear nose and throat doctor in 2-3 days. Final Clinical Impressions(s) / ED Diagnoses   Final diagnoses:  Anterior epistaxis  Anemia, unspecified type    ED Discharge Orders    None       Davonna Belling, MD 09/15/17 2336

## 2017-09-17 ENCOUNTER — Ambulatory Visit (INDEPENDENT_AMBULATORY_CARE_PROVIDER_SITE_OTHER): Payer: PPO | Admitting: Otolaryngology

## 2017-09-17 DIAGNOSIS — H6123 Impacted cerumen, bilateral: Secondary | ICD-10-CM | POA: Diagnosis not present

## 2017-09-17 DIAGNOSIS — R040 Epistaxis: Secondary | ICD-10-CM

## 2017-09-18 ENCOUNTER — Other Ambulatory Visit: Payer: Self-pay

## 2017-09-18 DIAGNOSIS — Z79899 Other long term (current) drug therapy: Secondary | ICD-10-CM | POA: Diagnosis not present

## 2017-09-18 LAB — COMPLETE METABOLIC PANEL WITH GFR
AG Ratio: 1.8 (calc) (ref 1.0–2.5)
ALBUMIN MSPROF: 4 g/dL (ref 3.6–5.1)
ALKALINE PHOSPHATASE (APISO): 82 U/L (ref 40–115)
ALT: 8 U/L — AB (ref 9–46)
AST: 15 U/L (ref 10–35)
BILIRUBIN TOTAL: 0.7 mg/dL (ref 0.2–1.2)
BUN / CREAT RATIO: 30 (calc) — AB (ref 6–22)
BUN: 26 mg/dL — ABNORMAL HIGH (ref 7–25)
CHLORIDE: 105 mmol/L (ref 98–110)
CO2: 26 mmol/L (ref 20–32)
CREATININE: 0.86 mg/dL (ref 0.70–1.18)
Calcium: 8.8 mg/dL (ref 8.6–10.3)
GFR, Est African American: 97 mL/min/{1.73_m2} (ref >=60)
GFR, Est Non African American: 84 mL/min/{1.73_m2} (ref >=60)
GLUCOSE: 127 mg/dL — AB (ref 65–99)
Globulin: 2.2 g/dL (calc) (ref 1.9–3.7)
Potassium: 4.4 mmol/L (ref 3.5–5.3)
Sodium: 140 mmol/L (ref 135–146)
Total Protein: 6.2 g/dL (ref 6.1–8.1)

## 2017-09-18 LAB — CBC WITH DIFFERENTIAL/PLATELET
BASOS PCT: 0.5 %
Basophils Absolute: 43 cells/uL (ref 0–200)
EOS PCT: 2.1 %
Eosinophils Absolute: 179 cells/uL (ref 15–500)
HEMATOCRIT: 29.4 % — AB (ref 38.5–50.0)
HEMOGLOBIN: 9.8 g/dL — AB (ref 13.2–17.1)
LYMPHS ABS: 2618 {cells}/uL (ref 850–3900)
MCH: 31.7 pg (ref 27.0–33.0)
MCHC: 33.3 g/dL (ref 32.0–36.0)
MCV: 95.1 fL (ref 80.0–100.0)
MPV: 10 fL (ref 7.5–12.5)
Monocytes Relative: 6.6 %
NEUTROS PCT: 60 %
Neutro Abs: 5100 cells/uL (ref 1500–7800)
Platelets: 204 10*3/uL (ref 140–400)
RBC: 3.09 10*6/uL — AB (ref 4.20–5.80)
RDW: 13.4 % (ref 11.0–15.0)
Total Lymphocyte: 30.8 %
WBC: 8.5 10*3/uL (ref 3.8–10.8)
WBCMIX: 561 {cells}/uL (ref 200–950)

## 2017-09-21 ENCOUNTER — Ambulatory Visit (INDEPENDENT_AMBULATORY_CARE_PROVIDER_SITE_OTHER): Payer: PPO | Admitting: Otolaryngology

## 2017-09-21 DIAGNOSIS — R040 Epistaxis: Secondary | ICD-10-CM

## 2017-09-21 NOTE — Progress Notes (Signed)
His hemoglobin has dropped. Please notify patient and fax the copy of the results to his PCP.

## 2017-09-23 DIAGNOSIS — Z1283 Encounter for screening for malignant neoplasm of skin: Secondary | ICD-10-CM | POA: Diagnosis not present

## 2017-09-23 DIAGNOSIS — L570 Actinic keratosis: Secondary | ICD-10-CM | POA: Diagnosis not present

## 2017-09-23 DIAGNOSIS — Z85820 Personal history of malignant melanoma of skin: Secondary | ICD-10-CM | POA: Diagnosis not present

## 2017-09-23 DIAGNOSIS — Z08 Encounter for follow-up examination after completed treatment for malignant neoplasm: Secondary | ICD-10-CM | POA: Diagnosis not present

## 2017-09-23 DIAGNOSIS — X32XXXD Exposure to sunlight, subsequent encounter: Secondary | ICD-10-CM | POA: Diagnosis not present

## 2017-09-23 DIAGNOSIS — C44319 Basal cell carcinoma of skin of other parts of face: Secondary | ICD-10-CM | POA: Diagnosis not present

## 2017-09-23 DIAGNOSIS — D225 Melanocytic nevi of trunk: Secondary | ICD-10-CM | POA: Diagnosis not present

## 2017-09-28 ENCOUNTER — Other Ambulatory Visit: Payer: Self-pay | Admitting: Rheumatology

## 2017-09-28 NOTE — Telephone Encounter (Signed)
Last Visit: 07/01/17 Next Visit: 12/02/17 Labs: 09/18/17 His hemoglobin has dropped  Okay to refill per Dr. Estanislado Pandy

## 2017-09-30 DIAGNOSIS — R972 Elevated prostate specific antigen [PSA]: Secondary | ICD-10-CM | POA: Diagnosis not present

## 2017-10-09 DIAGNOSIS — N4232 Atypical small acinar proliferation of prostate: Secondary | ICD-10-CM | POA: Diagnosis not present

## 2017-10-09 DIAGNOSIS — R972 Elevated prostate specific antigen [PSA]: Secondary | ICD-10-CM | POA: Diagnosis not present

## 2017-10-09 DIAGNOSIS — N401 Enlarged prostate with lower urinary tract symptoms: Secondary | ICD-10-CM | POA: Diagnosis not present

## 2017-10-19 ENCOUNTER — Ambulatory Visit (INDEPENDENT_AMBULATORY_CARE_PROVIDER_SITE_OTHER): Payer: PPO | Admitting: Otolaryngology

## 2017-10-19 DIAGNOSIS — R040 Epistaxis: Secondary | ICD-10-CM

## 2017-11-06 DIAGNOSIS — R972 Elevated prostate specific antigen [PSA]: Secondary | ICD-10-CM | POA: Diagnosis not present

## 2017-11-11 DIAGNOSIS — Z08 Encounter for follow-up examination after completed treatment for malignant neoplasm: Secondary | ICD-10-CM | POA: Diagnosis not present

## 2017-11-11 DIAGNOSIS — L570 Actinic keratosis: Secondary | ICD-10-CM | POA: Diagnosis not present

## 2017-11-11 DIAGNOSIS — X32XXXD Exposure to sunlight, subsequent encounter: Secondary | ICD-10-CM | POA: Diagnosis not present

## 2017-11-11 DIAGNOSIS — Z85828 Personal history of other malignant neoplasm of skin: Secondary | ICD-10-CM | POA: Diagnosis not present

## 2017-11-19 NOTE — Progress Notes (Signed)
Office Visit Note  Patient: Bryan Canela Tantillo Sr.             Date of Birth: [birth year]           MRN: [id number]             PCP: Kathyrn Drown, MD Referring: Kathyrn Drown, MD Visit Date: 12/02/2017 Occupation: @GUAROCC @    Subjective:  Medication Management   History of Present Illness: Bryan MIZZELL Sr. is a 77 y.o. male history of rheumatoid arthritis and osteoarthritis.  He states in January he had severe nosebleed for which he was seen in the emergency room.  He had follow-up with the ENT for cauterization.  He has had no recurrence of bleeding since then.  He states he has been taking iron pills for anemia.  His arthritis seems to be quite well controlled with methotrexate.  He states he has intermittent increased pain with the weather change in activity.  He denies any joint swelling.  Activities of Daily Living:  Patient reports morning stiffness for 0 minute.   Patient Denies nocturnal pain.  Difficulty dressing/grooming: Denies Difficulty climbing stairs: Denies Difficulty getting out of chair: Denies Difficulty using hands for taps, buttons, cutlery, and/or writing: Reports   Review of Systems  Constitutional: Positive for fatigue. Negative for night sweats.  HENT: Negative for mouth sores, mouth dryness and nose dryness.   Eyes: Positive for dryness. Negative for redness.  Respiratory: Negative for shortness of breath and difficulty breathing.   Cardiovascular: Negative for chest pain, palpitations, hypertension, irregular heartbeat and swelling in legs/feet.  Gastrointestinal: Negative for constipation and diarrhea.  Endocrine: Negative for increased urination.  Musculoskeletal: Positive for arthralgias and joint pain. Negative for joint swelling, myalgias, muscle weakness, morning stiffness, muscle tenderness and myalgias.  Skin: Negative for color change, rash, hair loss, nodules/bumps, skin tightness, ulcers and sensitivity to sunlight.  Allergic/Immunologic:  Negative for susceptible to infections.  Neurological: Negative for dizziness, fainting, memory loss, night sweats and weakness ( ).  Hematological: Negative for swollen glands.  Psychiatric/Behavioral: Negative for depressed mood and sleep disturbance. The patient is not nervous/anxious.     PMFS History:  Patient Active Problem List   Diagnosis Date Noted  . Bryan Maldonado 09/19/2016  . History of total knee replacement, bilateral 09/19/2016  . Gastroesophageal reflux disease without esophagitis 09/19/2016  . History of melanoma 09/19/2016  . History of basal cell carcinoma 09/19/2016  . History of anemia 09/19/2016  . Allergic rhinitis 02/18/2016  . Melanoma of skin ([place]) 06/06/2015  . Prediabetes 11/20/2014  . Hyperlipidemia 11/20/2014  . BPH (benign prostatic hyperplasia) 05/22/2014  . Rheumatoid arthritis involving multiple sites with positive rheumatoid factor (Bryan Maldonado) 01/05/2013  . Chronic pain syndrome 01/05/2013    Past Medical History:  Diagnosis Date  . Anemia   . Basal cell carcinoma   . BPH (benign prostatic hyperplasia)   . GERD (gastroesophageal reflux disease)   . Melanoma (Bryan Maldonado)   . Osteoarthritis   . Rheumatoid aortitis     Family History  Problem Relation Age of Onset  . Rheum arthritis Mother   . Emphysema Father   . Cancer Brother   . Emphysema Sister    Past Surgical History:  Procedure Laterality Date  . COLONOSCOPY    . HERNIA REPAIR    . JOINT REPLACEMENT Bilateral   . MELANOMA EXCISION     Social History   Social History Narrative  . Not on file  Objective: Vital Signs: BP (!) 144/79 (BP Location: Left Arm, Patient Position: Sitting, Cuff Size: Normal)   Pulse 75   Resp 16   Ht 5\' 7"  (1.702 m)   Wt 189 lb (85.7 kg)   BMI 29.60 kg/m    Physical Exam  Constitutional: He is oriented to person, place, and time. He appears well-developed and well-nourished.  HENT:  Head: Normocephalic and atraumatic.  Eyes: Pupils are  equal, round, and reactive to light. Conjunctivae and EOM are normal.  Neck: Normal range of motion. Neck supple.  Cardiovascular: Normal rate, regular rhythm and normal heart sounds.  Pulmonary/Chest: Effort normal and breath sounds normal.  Abdominal: Soft. Bowel sounds are normal.  Neurological: He is alert and oriented to person, place, and time.  Skin: Skin is warm and dry. Capillary refill takes less than 2 seconds.  Psychiatric: He has a normal mood and affect. His behavior is normal.  Nursing note and vitals reviewed.    Musculoskeletal Exam: C-spine limited range of motion.  Shoulder joint abduction is limited to 90 degrees bilaterally.  Right elbow joint contracture was noted.  He has limited range of motion of bilateral wrist joints with some synovitis.  He had subluxation of all MCP joints with synovial thickening and no synovitis.  He had hyperextension of all PIP joints and flexion of all DIP joints without synovitis.  Hip joints were in good range of motion.  His bilateral total knee replacements are doing well.  He has some warmth on palpation of his knee joints.  He had limited range of motion of his ankle joints.  He has MTP and PIP subluxation. No synovitis was noted over MTPs or PIPs.  CDAI Exam: CDAI Homunculus Exam:   Tenderness:  RUE: wrist LUE: wrist  Swelling:  RUE: wrist LUE: wrist  Joint Counts:  CDAI Tender Joint count: 2 CDAI Swollen Joint count: 2  Global Assessments:  Patient Global Assessment: 5 Provider Global Assessment: 5  CDAI Calculated Score: 14    Investigation: No additional findings. CBC Latest Ref Rng & Units 09/18/2017 09/15/2017 06/23/2017  WBC 3.8 - 10.8 Thousand/uL 8.5 5.2 5.8  Hemoglobin 13.2 - 17.1 g/dL 9.8(L) 10.8(L) 12.2(L)  Hematocrit 38.5 - 50.0 % 29.4(L) 33.5(L) 35.3(L)  Platelets 140 - 400 Thousand/uL 204 198 201   CMP Latest Ref Rng & Units 09/18/2017 09/07/2017 07/01/2017  Glucose 65 - 99 mg/dL 127(H) 117(H) 101(H)  BUN  7 - 25 mg/dL 26(H) 13 18  Creatinine 0.70 - 1.18 mg/dL 0.86 0.79 0.74  Sodium 135 - 146 mmol/L 140 142 139  Potassium 3.5 - 5.3 mmol/L 4.4 4.9 4.6  Chloride 98 - 110 mmol/L 105 101 106  CO2 20 - 32 mmol/L 26 26 23   Calcium 8.6 - 10.3 mg/dL 8.8 9.3 9.2  Total Protein 6.1 - 8.1 g/dL 6.2 - 6.8  Total Bilirubin 0.2 - 1.2 mg/dL 0.7 - 0.8  Alkaline Phos 40 - 115 U/L - - -  AST 10 - 35 U/L 15 - 26  ALT 9 - 46 U/L 8(L) - 15    Imaging: No results found.  Speciality Comments: No specialty comments available.    Procedures:  No procedures performed Allergies: Morphine and related and Acyclovir and related   Assessment / Plan:     Visit Diagnoses: Rheumatoid arthritis involving multiple sites with positive rheumatoid factor (HCC) - Positive rheumatoid factor, positive anti-CCP, severe erosive disease with nodulosis and contractures.  He is doing much better on methotrexate.  He  has some synovitis in his wrist joints.  We discussed increasing dose of methotrexate to 7 tablets/week.  He does not want to change dose at this point.  He states inflammation developed after doing some gardening.  I also offered x-rays of bilateral hands and feet.  He would like to wait at this time.  Today and then every 3 months to monitor for drug toxicity.  Bryan Maldonado - MTX 6 tabs po q wk, folic acid 1 mg po qd - Plan: CBC with Differential/Platelet, COMPLETE METABOLIC PANEL WITH GFR  Pain in both hands: He has some synovitis in bilateral wrist joints which is causing discomfort.  History of total knee replacement, bilateral: Doing well  Other medical problems are listed as follows:  History of basal cell carcinoma  History of melanoma  Prediabetes  Chronic pain syndrome  History of gastroesophageal reflux (GERD)  History of hyperlipidemia  History of anemia    Orders: Orders Placed This Encounter  Procedures  . CBC with Differential/Platelet  . COMPLETE METABOLIC PANEL WITH GFR     No orders of the defined types were placed in this encounter.   Face-to-face time spent with patient was 30 minutes.  Greater than 50% of time was spent in counseling and coordination of care.  Follow-Up Instructions: Return in about 5 months (around 05/04/2018) for Rheumatoid arthritis.   Bo Merino, MD  Note - This record has been created using Editor, commissioning.  Chart creation errors have been sought, but may not always  have been located. Such creation errors do not reflect on  the standard of medical care.

## 2017-11-24 ENCOUNTER — Other Ambulatory Visit: Payer: Self-pay | Admitting: Family Medicine

## 2017-12-02 ENCOUNTER — Ambulatory Visit: Payer: PPO | Admitting: Rheumatology

## 2017-12-02 ENCOUNTER — Encounter: Payer: Self-pay | Admitting: Rheumatology

## 2017-12-02 VITALS — BP 144/79 | HR 75 | Resp 16 | Ht 67.0 in | Wt 189.0 lb

## 2017-12-02 DIAGNOSIS — Z8719 Personal history of other diseases of the digestive system: Secondary | ICD-10-CM

## 2017-12-02 DIAGNOSIS — M79642 Pain in left hand: Secondary | ICD-10-CM

## 2017-12-02 DIAGNOSIS — Z96653 Presence of artificial knee joint, bilateral: Secondary | ICD-10-CM | POA: Diagnosis not present

## 2017-12-02 DIAGNOSIS — Z79899 Other long term (current) drug therapy: Secondary | ICD-10-CM | POA: Diagnosis not present

## 2017-12-02 DIAGNOSIS — M0579 Rheumatoid arthritis with rheumatoid factor of multiple sites without organ or systems involvement: Secondary | ICD-10-CM

## 2017-12-02 DIAGNOSIS — Z8639 Personal history of other endocrine, nutritional and metabolic disease: Secondary | ICD-10-CM

## 2017-12-02 DIAGNOSIS — M79641 Pain in right hand: Secondary | ICD-10-CM

## 2017-12-02 DIAGNOSIS — R7303 Prediabetes: Secondary | ICD-10-CM | POA: Diagnosis not present

## 2017-12-02 DIAGNOSIS — Z862 Personal history of diseases of the blood and blood-forming organs and certain disorders involving the immune mechanism: Secondary | ICD-10-CM

## 2017-12-02 DIAGNOSIS — Z85828 Personal history of other malignant neoplasm of skin: Secondary | ICD-10-CM | POA: Diagnosis not present

## 2017-12-02 DIAGNOSIS — Z85820 Personal history of malignant melanoma of skin: Secondary | ICD-10-CM

## 2017-12-02 DIAGNOSIS — G894 Chronic pain syndrome: Secondary | ICD-10-CM

## 2017-12-02 LAB — COMPLETE METABOLIC PANEL WITH GFR
AG Ratio: 1.6 (calc) (ref 1.0–2.5)
ALBUMIN MSPROF: 4.1 g/dL (ref 3.6–5.1)
ALT: 16 U/L (ref 9–46)
AST: 18 U/L (ref 10–35)
Alkaline phosphatase (APISO): 72 U/L (ref 40–115)
BILIRUBIN TOTAL: 0.6 mg/dL (ref 0.2–1.2)
BUN: 15 mg/dL (ref 7–25)
CALCIUM: 9.1 mg/dL (ref 8.6–10.3)
CO2: 29 mmol/L (ref 20–32)
CREATININE: 0.79 mg/dL (ref 0.70–1.18)
Chloride: 107 mmol/L (ref 98–110)
GFR, EST AFRICAN AMERICAN: 100 mL/min/{1.73_m2} (ref >=60)
GFR, EST NON AFRICAN AMERICAN: 87 mL/min/{1.73_m2} (ref >=60)
GLUCOSE: 104 mg/dL — AB (ref 65–99)
Globulin: 2.5 g/dL (calc) (ref 1.9–3.7)
Potassium: 4.7 mmol/L (ref 3.5–5.3)
Sodium: 142 mmol/L (ref 135–146)
TOTAL PROTEIN: 6.6 g/dL (ref 6.1–8.1)

## 2017-12-02 LAB — CBC WITH DIFFERENTIAL/PLATELET
BASOS PCT: 0.3 %
Basophils Absolute: 19 cells/uL (ref 0–200)
EOS ABS: 120 {cells}/uL (ref 15–500)
Eosinophils Relative: 1.9 %
HCT: 35.6 % — ABNORMAL LOW (ref 38.5–50.0)
HEMOGLOBIN: 12.1 g/dL — AB (ref 13.2–17.1)
Lymphs Abs: 2671 cells/uL (ref 850–3900)
MCH: 31.8 pg (ref 27.0–33.0)
MCHC: 34 g/dL (ref 32.0–36.0)
MCV: 93.4 fL (ref 80.0–100.0)
MONOS PCT: 6.5 %
MPV: 9.8 fL (ref 7.5–12.5)
NEUTROS ABS: 3081 {cells}/uL (ref 1500–7800)
Neutrophils Relative %: 48.9 %
PLATELETS: 215 10*3/uL (ref 140–400)
RBC: 3.81 10*6/uL — AB (ref 4.20–5.80)
RDW: 13.3 % (ref 11.0–15.0)
Total Lymphocyte: 42.4 %
WBC: 6.3 10*3/uL (ref 3.8–10.8)
WBCMIX: 410 {cells}/uL (ref 200–950)

## 2017-12-02 NOTE — Patient Instructions (Signed)
Standing Labs We placed an order today for your standing lab work.    Please come back and get your standing labs in 3 months and then every 3 months  We have open lab Monday through Friday from 8:30-11:30 AM and 1:30-4:00 PM  at the office of Dr. Niasha Devins.   You may experience shorter wait times on Monday and Friday afternoons. The office is located at [address], [address], [place], [place] [address] No appointment is necessary.   Labs are drawn by [facility].  You may receive a bill from [facility] for your lab work. If you have any questions regarding directions or hours of operation,  please call [phone].    

## 2017-12-11 DIAGNOSIS — R972 Elevated prostate specific antigen [PSA]: Secondary | ICD-10-CM | POA: Diagnosis not present

## 2017-12-28 ENCOUNTER — Other Ambulatory Visit: Payer: Self-pay | Admitting: Rheumatology

## 2017-12-29 NOTE — Telephone Encounter (Signed)
Last Visit: 12/02/17 Next visit: 05/07/18 Labs: 12/02/17 stable  Okay to refill per Dr. Estanislado Pandy

## 2018-02-17 ENCOUNTER — Other Ambulatory Visit: Payer: Self-pay | Admitting: *Deleted

## 2018-02-17 DIAGNOSIS — Z79899 Other long term (current) drug therapy: Secondary | ICD-10-CM | POA: Diagnosis not present

## 2018-02-18 NOTE — Progress Notes (Signed)
Stable, Glu elevated

## 2018-03-05 ENCOUNTER — Ambulatory Visit: Payer: PPO | Admitting: Family Medicine

## 2018-03-10 DIAGNOSIS — L570 Actinic keratosis: Secondary | ICD-10-CM | POA: Diagnosis not present

## 2018-03-10 DIAGNOSIS — Z85820 Personal history of malignant melanoma of skin: Secondary | ICD-10-CM | POA: Diagnosis not present

## 2018-03-10 DIAGNOSIS — Z08 Encounter for follow-up examination after completed treatment for malignant neoplasm: Secondary | ICD-10-CM | POA: Diagnosis not present

## 2018-03-10 DIAGNOSIS — C44519 Basal cell carcinoma of skin of other part of trunk: Secondary | ICD-10-CM | POA: Diagnosis not present

## 2018-03-10 DIAGNOSIS — C4441 Basal cell carcinoma of skin of scalp and neck: Secondary | ICD-10-CM | POA: Diagnosis not present

## 2018-03-10 DIAGNOSIS — L905 Scar conditions and fibrosis of skin: Secondary | ICD-10-CM | POA: Diagnosis not present

## 2018-03-10 DIAGNOSIS — D225 Melanocytic nevi of trunk: Secondary | ICD-10-CM | POA: Diagnosis not present

## 2018-03-10 DIAGNOSIS — X32XXXD Exposure to sunlight, subsequent encounter: Secondary | ICD-10-CM | POA: Diagnosis not present

## 2018-03-10 DIAGNOSIS — Z1283 Encounter for screening for malignant neoplasm of skin: Secondary | ICD-10-CM | POA: Diagnosis not present

## 2018-03-12 ENCOUNTER — Other Ambulatory Visit: Payer: Self-pay | Admitting: Rheumatology

## 2018-03-12 NOTE — Telephone Encounter (Signed)
Last Visit: 12/02/17 Next visit: 05/07/18 Labs: 02/17/18 stable elevated glucose  Okay to refill per Dr. Estanislado Pandy

## 2018-03-17 ENCOUNTER — Ambulatory Visit: Payer: PPO | Admitting: Family Medicine

## 2018-03-29 DIAGNOSIS — R972 Elevated prostate specific antigen [PSA]: Secondary | ICD-10-CM | POA: Diagnosis not present

## 2018-03-30 ENCOUNTER — Ambulatory Visit: Payer: PPO | Admitting: Family Medicine

## 2018-03-31 ENCOUNTER — Encounter: Payer: Self-pay | Admitting: Family Medicine

## 2018-03-31 ENCOUNTER — Ambulatory Visit (INDEPENDENT_AMBULATORY_CARE_PROVIDER_SITE_OTHER): Payer: PPO | Admitting: Family Medicine

## 2018-03-31 VITALS — BP 112/64 | Ht 67.0 in | Wt 183.0 lb

## 2018-03-31 DIAGNOSIS — K219 Gastro-esophageal reflux disease without esophagitis: Secondary | ICD-10-CM

## 2018-03-31 DIAGNOSIS — R600 Localized edema: Secondary | ICD-10-CM

## 2018-03-31 DIAGNOSIS — M0579 Rheumatoid arthritis with rheumatoid factor of multiple sites without organ or systems involvement: Secondary | ICD-10-CM | POA: Diagnosis not present

## 2018-03-31 MED ORDER — POTASSIUM CHLORIDE CRYS ER 20 MEQ PO TBCR
EXTENDED_RELEASE_TABLET | ORAL | 5 refills | Status: DC
Start: 2018-03-31 — End: 2018-09-06

## 2018-03-31 MED ORDER — OMEPRAZOLE 20 MG PO CPDR: 20.0000 mg | DELAYED_RELEASE_CAPSULE | Freq: Every day | ORAL | 1 refills | Status: DC

## 2018-03-31 MED ORDER — ACETAMINOPHEN-CODEINE #3 300-30 MG PO TABS: ORAL_TABLET | ORAL | 0 refills | Status: DC

## 2018-03-31 MED ORDER — FUROSEMIDE 20 MG PO TABS: ORAL_TABLET | ORAL | 1 refills | Status: DC

## 2018-03-31 NOTE — Progress Notes (Signed)
   Subjective:    Patient ID: Bryan Homans Sr., male    DOB: [birth year], 77 y.o.   MRN: [id number]  HPIfollow up on meds. Pt states no concerns today. Needs all meds updated.  Patient has arthritis followed by specialist on methotrexate takes Tylenol 3 intermittently to help with this has pedal edema watch his diet stays active denies any chest tightness shortness of breath nausea vomiting takes medication does seem to help it  15 minutes was spent with patient today discussing healthcare issues which they came.  More than 50% of this visit-total duration of visit-was spent in counseling and coordination of care.  Please see diagnosis regarding the focus of this coordination and care    Review of Systems  Constitutional: Negative for diaphoresis and fatigue.  HENT: Negative for congestion and rhinorrhea.   Respiratory: Negative for cough and shortness of breath.   Cardiovascular: Negative for chest pain and leg swelling.  Gastrointestinal: Negative for abdominal pain and diarrhea.  Skin: Negative for color change and rash.  Neurological: Negative for dizziness and headaches.  Psychiatric/Behavioral: Negative for behavioral problems and confusion.       Objective:   Physical Exam  Constitutional: He appears well-nourished. No distress.  HENT:  Head: Normocephalic and atraumatic.  Eyes: Right eye exhibits no discharge. Left eye exhibits no discharge.  Neck: No tracheal deviation present.  Cardiovascular: Normal rate, regular rhythm and normal heart sounds.  No murmur heard. Pulmonary/Chest: Effort normal and breath sounds normal. No respiratory distress.  Musculoskeletal: He exhibits no edema.  Lymphadenopathy:    He has no cervical adenopathy.  Neurological: He is alert. Coordination normal.  Skin: Skin is warm and dry.  Psychiatric: He has a normal mood and affect. His behavior is normal.  Vitals reviewed.         Assessment & Plan:  Pedal edema use diuretic and  potassium when necessary  Arthritis followed by specialist Arthritic pain Tylenol 3 when necessary prescription given Reflux under good control medication continue omeprazole

## 2018-04-19 DIAGNOSIS — Z85828 Personal history of other malignant neoplasm of skin: Secondary | ICD-10-CM | POA: Diagnosis not present

## 2018-04-19 DIAGNOSIS — Z08 Encounter for follow-up examination after completed treatment for malignant neoplasm: Secondary | ICD-10-CM | POA: Diagnosis not present

## 2018-04-23 NOTE — Progress Notes (Signed)
Office Visit Note  Patient: Bryan Quinter Hilyard Sr.             Date of Birth: 03/28/1941           MRN: [id number]             PCP: Kathyrn Drown, MD Referring: Kathyrn Drown, MD Visit Date: 05/07/2018 Occupation: @GUAROCC @  Subjective:  Medication management.   History of Present Illness: Bryan MOJICA Sr. is a 77 y.o. male with history of rheumatoid arthritis and osteoarthritis.  He states his arthritis is quite well controlled on methotrexate 6 tablets/week.  He denies any joint swelling.  He is very active.  He states after doing yard work he gets some stiffness the next day.  Activities of Daily Living:  Patient reports morning stiffness for 0 minute.   Patient Denies nocturnal pain.  Difficulty dressing/grooming: Denies Difficulty climbing stairs: Denies Difficulty getting out of chair: Reports Difficulty using hands for taps, buttons, cutlery, and/or writing: Reports  Review of Systems  Constitutional: Positive for fatigue. Negative for night sweats.  HENT: Positive for mouth dryness. Negative for mouth sores and nose dryness.   Eyes: Positive for dryness. Negative for redness.  Respiratory: Negative for shortness of breath and difficulty breathing.   Cardiovascular: Negative for chest pain, palpitations, hypertension, irregular heartbeat and swelling in legs/feet.  Gastrointestinal: Negative for constipation and diarrhea.  Endocrine: Negative for increased urination.  Musculoskeletal: Positive for arthralgias and joint pain. Negative for joint swelling, myalgias, muscle weakness, morning stiffness, muscle tenderness and myalgias.  Skin: Negative for color change, rash, hair loss, nodules/bumps, skin tightness, ulcers and sensitivity to sunlight.  Allergic/Immunologic: Negative for susceptible to infections.  Neurological: Negative for dizziness, fainting, memory loss, night sweats and weakness ( ).  Hematological: Negative for swollen glands.  Psychiatric/Behavioral:  Negative for depressed mood and sleep disturbance. The patient is not nervous/anxious.     PMFS History:  Patient Active Problem List   Diagnosis Date Noted  . High risk medication use 09/19/2016  . History of total knee replacement, bilateral 09/19/2016  . Gastroesophageal reflux disease without esophagitis 09/19/2016  . History of melanoma 09/19/2016  . History of basal cell carcinoma 09/19/2016  . History of anemia 09/19/2016  . Allergic rhinitis 02/18/2016  . Melanoma of skin ([place]) 06/06/2015  . Prediabetes 11/20/2014  . Hyperlipidemia 11/20/2014  . BPH (benign prostatic hyperplasia) 05/22/2014  . Rheumatoid arthritis involving multiple sites with positive rheumatoid factor ([place]) 01/05/2013  . Chronic pain syndrome 01/05/2013    Past Medical History:  Diagnosis Date  . Anemia   . Basal cell carcinoma   . BPH (benign prostatic hyperplasia)   . GERD (gastroesophageal reflux disease)   . Melanoma ([place])   . Osteoarthritis   . Rheumatoid aortitis     Family History  Problem Relation Age of Onset  . Rheum arthritis Mother   . Emphysema Father   . Cancer Brother   . Emphysema Sister    Past Surgical History:  Procedure Laterality Date  . COLONOSCOPY    . HERNIA REPAIR    . JOINT REPLACEMENT Bilateral   . MELANOMA EXCISION     Social History   Social History Narrative  . Not on file    Objective: Vital Signs: BP 133/75 (BP Location: Left Arm, Patient Position: Sitting, Cuff Size: Normal)   Pulse 64   Resp 14   Ht 5\' 7"  (1.702 m)   Wt 183 lb (83 kg)   BMI  28.66 kg/m    Physical Exam  Constitutional: He is oriented to person, place, and time. He appears well-developed and well-nourished.  HENT:  Head: Normocephalic and atraumatic.  Eyes: Pupils are equal, round, and reactive to light. Conjunctivae and EOM are normal.  Neck: Normal range of motion. Neck supple.  Cardiovascular: Normal rate, regular rhythm and normal heart sounds.  Pulmonary/Chest: Effort  normal and breath sounds normal.  Abdominal: Soft. Bowel sounds are normal.  Neurological: He is alert and oriented to person, place, and time.  Skin: Skin is warm and dry. Capillary refill takes less than 2 seconds.  Psychiatric: He has a normal mood and affect. His behavior is normal.  Nursing note and vitals reviewed.    Musculoskeletal Exam: C-spine limited range of motion.  Shoulder joint abduction was limited to 90 degrees.  He is contracture in bilateral elbows.  He has some synovial thickening and mild synovitis in his right wrist joint.  He has limited range of motion of bilateral wrist joints.  He has bilateral MCP thickening with a flexion contracture and PIP thickening with extension contracture.  His swan-neck deformities in bilateral hands.  Hip joints were in good range of motion.  Knee joints are replaced.  No synovitis was noted.  He has some MTPs and PIP changes consistent with rheumatoid arthritis but no synovitis was noted.  CDAI Exam: CDAI Score: 2  Patient Global Assessment: 4 (mm); Provider Global Assessment: 6 (mm) Swollen: 1 ; Tender: 0  Joint Exam      Right  Left  Wrist  Swollen         Investigation: No additional findings.  Imaging: No results found.  Recent Labs: Lab Results  Component Value Date   WBC 6.1 02/17/2018   HGB 11.2 (L) 02/17/2018   PLT 184 02/17/2018   NA 140 02/17/2018   K 4.5 02/17/2018   CL 104 02/17/2018   CO2 29 02/17/2018   GLUCOSE 143 (H) 02/17/2018   BUN 15 02/17/2018   CREATININE 0.76 02/17/2018   BILITOT 1.0 02/17/2018   ALKPHOS 68 03/23/2017   AST 14 02/17/2018   ALT 10 02/17/2018   PROT 6.5 02/17/2018   ALBUMIN 4.2 03/23/2017   CALCIUM 9.0 02/17/2018   GFRAA 102 02/17/2018    Speciality Comments: No specialty comments available.  Procedures:  No procedures performed Allergies: Morphine and related and Acyclovir and related   Assessment / Plan:     Visit Diagnoses: Rheumatoid arthritis involving multiple  sites with positive rheumatoid factor (HCC) - Positive rheumatoid factor, positive anti-CCP, severe erosive disease with nodulosis and contractures.  He has been very active and doing yard work.  He has some synovial thickening and mild synovitis in his right wrist which she believes is due to the work he did in the yard yesterday.  Otherwise he has been doing well without any swelling.  He does have some discomfort off and on in his hands and feet.  I will obtain x-rays of his bilateral hands and bilateral feet today.  High risk medication use - MTX 6 tabs po q wk, folic acid 2 mg po qd. his labs have been stable.  We will continue to monitor his labs every 3 months.  History of total knee replacement, bilateral-doing well.  Pain in both hands - Plan: XR Hand 2 View Right, XR Hand 2 View Left.  He has severe erosive rheumatoid arthritis with subluxation of multiple joints.  No radiographic progression was noted from the x-rays  of 2012.  Pain in both feet - Plan: XR Foot 2 Views Right, XR Foot 2 Views Left.  X-rays reveal severe erosive rheumatoid arthritis with subluxation of multiple joints.  But no radiographic progression was noted from the x-rays of 2012.  History of melanoma-he has been followed by dermatology.  History of basal cell carcinoma  Chronic pain syndrome  History of gastroesophageal reflux (GERD)  Prediabetes  History of hyperlipidemia  History of anemia    Orders: Orders Placed This Encounter  Procedures  . XR Hand 2 View Right  . XR Hand 2 View Left  . XR Foot 2 Views Right  . XR Foot 2 Views Left   No orders of the defined types were placed in this encounter.   Face-to-face time spent with patient was 30 minutes. Greater than 50% of time was spent in counseling and coordination of care.  Follow-Up Instructions: Return in about 5 months (around 10/07/2018) for Rheumatoid arthritis.   Bo Merino, MD  Note - This record has been created using Radio producer.  Chart creation errors have been sought, but may not always  have been located. Such creation errors do not reflect on  the standard of medical care.

## 2018-04-28 DIAGNOSIS — R351 Nocturia: Secondary | ICD-10-CM | POA: Diagnosis not present

## 2018-04-28 DIAGNOSIS — R972 Elevated prostate specific antigen [PSA]: Secondary | ICD-10-CM | POA: Diagnosis not present

## 2018-04-28 DIAGNOSIS — N401 Enlarged prostate with lower urinary tract symptoms: Secondary | ICD-10-CM | POA: Diagnosis not present

## 2018-05-07 ENCOUNTER — Other Ambulatory Visit: Payer: Self-pay

## 2018-05-07 ENCOUNTER — Ambulatory Visit (INDEPENDENT_AMBULATORY_CARE_PROVIDER_SITE_OTHER): Payer: Self-pay

## 2018-05-07 ENCOUNTER — Ambulatory Visit: Payer: PPO | Admitting: Rheumatology

## 2018-05-07 ENCOUNTER — Encounter: Payer: Self-pay | Admitting: Rheumatology

## 2018-05-07 ENCOUNTER — Ambulatory Visit (INDEPENDENT_AMBULATORY_CARE_PROVIDER_SITE_OTHER): Payer: PPO

## 2018-05-07 VITALS — BP 133/75 | HR 64 | Resp 14 | Ht 67.0 in | Wt 183.0 lb

## 2018-05-07 DIAGNOSIS — R7303 Prediabetes: Secondary | ICD-10-CM

## 2018-05-07 DIAGNOSIS — Z79899 Other long term (current) drug therapy: Secondary | ICD-10-CM

## 2018-05-07 DIAGNOSIS — G894 Chronic pain syndrome: Secondary | ICD-10-CM | POA: Diagnosis not present

## 2018-05-07 DIAGNOSIS — M79671 Pain in right foot: Secondary | ICD-10-CM | POA: Diagnosis not present

## 2018-05-07 DIAGNOSIS — M0579 Rheumatoid arthritis with rheumatoid factor of multiple sites without organ or systems involvement: Secondary | ICD-10-CM

## 2018-05-07 DIAGNOSIS — Z96653 Presence of artificial knee joint, bilateral: Secondary | ICD-10-CM | POA: Diagnosis not present

## 2018-05-07 DIAGNOSIS — Z8639 Personal history of other endocrine, nutritional and metabolic disease: Secondary | ICD-10-CM

## 2018-05-07 DIAGNOSIS — M79641 Pain in right hand: Secondary | ICD-10-CM

## 2018-05-07 DIAGNOSIS — M79672 Pain in left foot: Secondary | ICD-10-CM | POA: Diagnosis not present

## 2018-05-07 DIAGNOSIS — Z8719 Personal history of other diseases of the digestive system: Secondary | ICD-10-CM

## 2018-05-07 DIAGNOSIS — M79642 Pain in left hand: Secondary | ICD-10-CM

## 2018-05-07 DIAGNOSIS — Z85828 Personal history of other malignant neoplasm of skin: Secondary | ICD-10-CM | POA: Diagnosis not present

## 2018-05-07 DIAGNOSIS — Z85820 Personal history of malignant melanoma of skin: Secondary | ICD-10-CM | POA: Diagnosis not present

## 2018-05-07 DIAGNOSIS — Z862 Personal history of diseases of the blood and blood-forming organs and certain disorders involving the immune mechanism: Secondary | ICD-10-CM

## 2018-05-07 NOTE — Patient Instructions (Signed)
Standing Labs We placed an order today for your standing lab work.    Please come back and get your standing labs in December and every 3 months   We have open lab Monday through Friday from 8:30-11:30 AM and 1:30-4:00 PM  at the office of Dr. Ilsa Bonello.   You may experience shorter wait times on Monday and Friday afternoons. The office is located at [address], [address], [place], [place] [address] No appointment is necessary.   Labs are drawn by [facility].  You may receive a bill from [facility] for your lab work. If you have any questions regarding directions or hours of operation,  please call [phone].     

## 2018-05-08 LAB — COMPLETE METABOLIC PANEL WITH GFR
AG Ratio: 1.6 (calc) (ref 1.0–2.5)
ALKALINE PHOSPHATASE (APISO): 65 U/L (ref 40–115)
ALT: 9 U/L (ref 9–46)
AST: 15 U/L (ref 10–35)
Albumin: 4 g/dL (ref 3.6–5.1)
BUN: 17 mg/dL (ref 7–25)
CALCIUM: 9 mg/dL (ref 8.6–10.3)
CO2: 28 mmol/L (ref 20–32)
CREATININE: 0.79 mg/dL (ref 0.70–1.18)
Chloride: 105 mmol/L (ref 98–110)
GFR, EST NON AFRICAN AMERICAN: 87 mL/min/{1.73_m2} (ref >=60)
GFR, Est African American: 100 mL/min/{1.73_m2} (ref >=60)
Globulin: 2.5 g/dL (calc) (ref 1.9–3.7)
Glucose, Bld: 103 mg/dL — ABNORMAL HIGH (ref 65–99)
POTASSIUM: 4.3 mmol/L (ref 3.5–5.3)
Sodium: 140 mmol/L (ref 135–146)
Total Bilirubin: 0.9 mg/dL (ref 0.2–1.2)
Total Protein: 6.5 g/dL (ref 6.1–8.1)

## 2018-05-08 LAB — CBC WITH DIFFERENTIAL/PLATELET
BASOS ABS: 28 {cells}/uL (ref 0–200)
Basophils Relative: 0.4 %
Eosinophils Absolute: 186 cells/uL (ref 15–500)
Eosinophils Relative: 2.7 %
HEMATOCRIT: 34.9 % — AB (ref 38.5–50.0)
Hemoglobin: 11.9 g/dL — ABNORMAL LOW (ref 13.2–17.1)
LYMPHS ABS: 3084 {cells}/uL (ref 850–3900)
MCH: 33 pg (ref 27.0–33.0)
MCHC: 34.1 g/dL (ref 32.0–36.0)
MCV: 96.7 fL (ref 80.0–100.0)
MPV: 9.6 fL (ref 7.5–12.5)
Monocytes Relative: 8.1 %
NEUTROS PCT: 44.1 %
Neutro Abs: 3043 cells/uL (ref 1500–7800)
PLATELETS: 199 10*3/uL (ref 140–400)
RBC: 3.61 10*6/uL — ABNORMAL LOW (ref 4.20–5.80)
RDW: 13.6 % (ref 11.0–15.0)
TOTAL LYMPHOCYTE: 44.7 %
WBC: 6.9 10*3/uL (ref 3.8–10.8)
WBCMIX: 559 {cells}/uL (ref 200–950)

## 2018-05-12 ENCOUNTER — Ambulatory Visit: Payer: PPO

## 2018-05-14 ENCOUNTER — Ambulatory Visit (INDEPENDENT_AMBULATORY_CARE_PROVIDER_SITE_OTHER): Payer: PPO

## 2018-05-14 DIAGNOSIS — Z23 Encounter for immunization: Secondary | ICD-10-CM

## 2018-05-31 ENCOUNTER — Other Ambulatory Visit: Payer: Self-pay | Admitting: Rheumatology

## 2018-05-31 NOTE — Telephone Encounter (Signed)
Last Visit: 05/07/18 Next visit: 10/08/18  Okay to refill per Dr. Estanislado Pandy

## 2018-06-01 LAB — COMPLETE METABOLIC PANEL WITH GFR
AG Ratio: 1.8 (calc) (ref 1.0–2.5)
ALKALINE PHOSPHATASE (APISO): 72 U/L (ref 40–115)
ALT: 10 U/L (ref 9–46)
AST: 14 U/L (ref 10–35)
Albumin: 4.2 g/dL (ref 3.6–5.1)
BILIRUBIN TOTAL: 1 mg/dL (ref 0.2–1.2)
BUN: 15 mg/dL (ref 7–25)
CHLORIDE: 104 mmol/L (ref 98–110)
CO2: 29 mmol/L (ref 20–32)
CREATININE: 0.76 mg/dL (ref 0.70–1.18)
Calcium: 9 mg/dL (ref 8.6–10.3)
GFR, Est African American: 102 mL/min/{1.73_m2} (ref >=60)
GFR, Est Non African American: 88 mL/min/{1.73_m2} (ref >=60)
GLUCOSE: 143 mg/dL — AB (ref 65–99)
Globulin: 2.3 g/dL (calc) (ref 1.9–3.7)
Potassium: 4.5 mmol/L (ref 3.5–5.3)
Sodium: 140 mmol/L (ref 135–146)
Total Protein: 6.5 g/dL (ref 6.1–8.1)

## 2018-06-01 LAB — CBC WITH DIFFERENTIAL/PLATELET
BASOS ABS: 18 {cells}/uL (ref 0–200)
Basophils Relative: 0.3 %
EOS PCT: 0.7 %
Eosinophils Absolute: 43 cells/uL (ref 15–500)
HCT: 32.7 % — ABNORMAL LOW (ref 38.5–50.0)
HEMOGLOBIN: 11.2 g/dL — AB (ref 13.2–17.1)
Lymphs Abs: 2208 cells/uL (ref 850–3900)
MCH: 32.3 pg (ref 27.0–33.0)
MCHC: 34.3 g/dL (ref 32.0–36.0)
MCV: 94.2 fL (ref 80.0–100.0)
MONOS PCT: 5.2 %
MPV: 10 fL (ref 7.5–12.5)
NEUTROS ABS: 3514 {cells}/uL (ref 1500–7800)
Neutrophils Relative %: 57.6 %
PLATELETS: 184 10*3/uL (ref 140–400)
RBC: 3.47 10*6/uL — ABNORMAL LOW (ref 4.20–5.80)
RDW: 14.7 % (ref 11.0–15.0)
TOTAL LYMPHOCYTE: 36.2 %
WBC mixed population: 317 cells/uL (ref 200–950)
WBC: 6.1 10*3/uL (ref 3.8–10.8)

## 2018-06-08 ENCOUNTER — Other Ambulatory Visit: Payer: Self-pay | Admitting: Rheumatology

## 2018-06-09 NOTE — Telephone Encounter (Signed)
Last Visit: 05/07/18 Next visit: 10/08/18 Labs: 05/07/18 Glucose is 103. Anemia is stable. We will continue to monitor  Okay to refill per Dr. Estanislado Pandy

## 2018-07-14 DIAGNOSIS — Z85828 Personal history of other malignant neoplasm of skin: Secondary | ICD-10-CM | POA: Diagnosis not present

## 2018-07-14 DIAGNOSIS — Z08 Encounter for follow-up examination after completed treatment for malignant neoplasm: Secondary | ICD-10-CM | POA: Diagnosis not present

## 2018-07-30 ENCOUNTER — Other Ambulatory Visit: Payer: Self-pay | Admitting: Family Medicine

## 2018-07-30 NOTE — Telephone Encounter (Signed)
[place] to see mond

## 2018-08-04 ENCOUNTER — Other Ambulatory Visit: Payer: Self-pay | Admitting: Family Medicine

## 2018-08-04 ENCOUNTER — Telehealth: Payer: Self-pay | Admitting: *Deleted

## 2018-08-04 MED ORDER — ACETAMINOPHEN-CODEINE #3 300-30 MG PO TABS: ORAL_TABLET | ORAL | 0 refills | Status: DC

## 2018-08-04 NOTE — Telephone Encounter (Signed)
Refill request from [facility] in [place] for Tylenol #3 300-30mg  #30 take one tablet by mouth once daily as needed for arthritis pain-caution drowsiness   Please advise

## 2018-08-04 NOTE — Telephone Encounter (Signed)
rx was sent in

## 2018-08-07 NOTE — Telephone Encounter (Signed)
This was sent in  

## 2018-08-13 ENCOUNTER — Other Ambulatory Visit: Payer: Self-pay

## 2018-08-13 DIAGNOSIS — Z79899 Other long term (current) drug therapy: Secondary | ICD-10-CM | POA: Diagnosis not present

## 2018-08-13 LAB — COMPLETE METABOLIC PANEL WITH GFR
AG RATIO: 1.4 (calc) (ref 1.0–2.5)
ALKALINE PHOSPHATASE (APISO): 72 U/L (ref 40–115)
ALT: 10 U/L (ref 9–46)
AST: 17 U/L (ref 10–35)
Albumin: 4.2 g/dL (ref 3.6–5.1)
BUN: 21 mg/dL (ref 7–25)
CALCIUM: 9.7 mg/dL (ref 8.6–10.3)
CO2: 29 mmol/L (ref 20–32)
CREATININE: 0.83 mg/dL (ref 0.70–1.18)
Chloride: 103 mmol/L (ref 98–110)
GFR, EST NON AFRICAN AMERICAN: 84 mL/min/{1.73_m2} (ref >=60)
GFR, Est African American: 98 mL/min/{1.73_m2} (ref >=60)
Globulin: 2.9 g/dL (calc) (ref 1.9–3.7)
Glucose, Bld: 91 mg/dL (ref 65–99)
POTASSIUM: 5 mmol/L (ref 3.5–5.3)
SODIUM: 140 mmol/L (ref 135–146)
Total Bilirubin: 0.9 mg/dL (ref 0.2–1.2)
Total Protein: 7.1 g/dL (ref 6.1–8.1)

## 2018-08-13 LAB — CBC WITH DIFFERENTIAL/PLATELET
BASOS PCT: 0.5 %
Basophils Absolute: 38 cells/uL (ref 0–200)
EOS ABS: 160 {cells}/uL (ref 15–500)
Eosinophils Relative: 2.1 %
HCT: 37.7 % — ABNORMAL LOW (ref 38.5–50.0)
HEMOGLOBIN: 12.6 g/dL — AB (ref 13.2–17.1)
Lymphs Abs: 3618 cells/uL (ref 850–3900)
MCH: 32.3 pg (ref 27.0–33.0)
MCHC: 33.4 g/dL (ref 32.0–36.0)
MCV: 96.7 fL (ref 80.0–100.0)
MONOS PCT: 6.6 %
MPV: 9.8 fL (ref 7.5–12.5)
NEUTROS ABS: 3283 {cells}/uL (ref 1500–7800)
Neutrophils Relative %: 43.2 %
Platelets: 210 10*3/uL (ref 140–400)
RBC: 3.9 10*6/uL — AB (ref 4.20–5.80)
RDW: 13.1 % (ref 11.0–15.0)
TOTAL LYMPHOCYTE: 47.6 %
WBC mixed population: 502 cells/uL (ref 200–950)
WBC: 7.6 10*3/uL (ref 3.8–10.8)

## 2018-08-30 ENCOUNTER — Other Ambulatory Visit: Payer: Self-pay | Admitting: Rheumatology

## 2018-08-30 NOTE — Telephone Encounter (Signed)
Last Visit: 05/07/18 Next visit: 10/08/18 Labs: 08/13/18 CMP WNL. Anemia is improving.  Okay to refill per Dr. Estanislado Pandy

## 2018-09-06 ENCOUNTER — Encounter: Payer: Self-pay | Admitting: Family Medicine

## 2018-09-06 ENCOUNTER — Ambulatory Visit (INDEPENDENT_AMBULATORY_CARE_PROVIDER_SITE_OTHER): Payer: PPO | Admitting: Family Medicine

## 2018-09-06 VITALS — BP 138/82 | Ht 67.0 in | Wt 191.0 lb

## 2018-09-06 DIAGNOSIS — E7849 Other hyperlipidemia: Secondary | ICD-10-CM

## 2018-09-06 DIAGNOSIS — C439 Malignant melanoma of skin, unspecified: Secondary | ICD-10-CM

## 2018-09-06 DIAGNOSIS — G894 Chronic pain syndrome: Secondary | ICD-10-CM | POA: Diagnosis not present

## 2018-09-06 DIAGNOSIS — M0579 Rheumatoid arthritis with rheumatoid factor of multiple sites without organ or systems involvement: Secondary | ICD-10-CM | POA: Diagnosis not present

## 2018-09-06 DIAGNOSIS — N400 Enlarged prostate without lower urinary tract symptoms: Secondary | ICD-10-CM | POA: Diagnosis not present

## 2018-09-06 MED ORDER — ZOSTER VAC RECOMB ADJUVANTED 50 MCG/0.5ML IM SUSR: 0.5000 mL | Freq: Once | INTRAMUSCULAR | 1 refills | Status: AC

## 2018-09-06 MED ORDER — FUROSEMIDE 20 MG PO TABS: 20.0000 mg | ORAL_TABLET | ORAL | 1 refills | Status: DC | PRN

## 2018-09-06 MED ORDER — POTASSIUM CHLORIDE CRYS ER 20 MEQ PO TBCR: EXTENDED_RELEASE_TABLET | ORAL | 5 refills | Status: DC

## 2018-09-06 MED ORDER — OMEPRAZOLE 20 MG PO CPDR: 20.0000 mg | DELAYED_RELEASE_CAPSULE | Freq: Every day | ORAL | 1 refills | Status: DC

## 2018-09-06 NOTE — Progress Notes (Signed)
Subjective:    Patient ID: Bryan Maldonado., male    DOB: 03/01/1941, 78 y.o.   MRN: [id number]  HPI  Patient is here today to follow up on his chronic health issues.   He does have a history of hyperlipidemia and he takes fish oil 2,000 mg bid.  He also has a a history of gerd and he takes Prilosec 20 mg one per day.   He states someone stole his lasix and will need refill on it.   He also states he needs a referral to Dr. Gwyneth Revels [facility]umatology patient relates rheumatology sees him on a regular basis is treatment with medication he has a lot of pain discomfort in his joints he does use a Tylenol 3 periodically to help with this he does need a referral   [facility] Dr.Devashaur he has had elevated PSAs been going on for several years also with BPH but so far they have not found cancer they are more monitoring this he states urinary flow going okay sees urology about every 6 months  ,Dr. Winifred Olive [facility] appt 09/22/18 [phone] He was seen by her local dermatologist who did a biopsy and did a removal of a melanoma and unfortunately still has areas that need further surgery he will need Mohs surgery he will need a referral for this  Patient does have chronic pain discomfort associated with his rheumatoid arthritis he uses a Tylenol 3 sparingly does not abuse it keeps it in a safe spot  Review of Systems  Constitutional: Negative for diaphoresis and fatigue.  HENT: Negative for congestion and rhinorrhea.   Respiratory: Negative for cough and shortness of breath.   Cardiovascular: Negative for chest pain and leg swelling.  Gastrointestinal: Negative for abdominal pain and diarrhea.  Skin: Negative for color change and rash.  Neurological: Negative for dizziness and headaches.  Psychiatric/Behavioral: Negative for behavioral problems and confusion.       Objective:   Physical Exam Vitals signs reviewed.  Constitutional:      General: He is not  in acute distress. HENT:     Head: Normocephalic and atraumatic.  Eyes:     General:        Right eye: No discharge.        Left eye: No discharge.  Neck:     Trachea: No tracheal deviation.  Cardiovascular:     Rate and Rhythm: Normal rate and regular rhythm.     Heart sounds: Normal heart sounds. No murmur.  Pulmonary:     Effort: Pulmonary effort is normal. No respiratory distress.     Breath sounds: Normal breath sounds.  Lymphadenopathy:     Cervical: No cervical adenopathy.  Skin:    General: Skin is warm and dry.  Neurological:     Mental Status: He is alert.     Coordination: Coordination normal.  Psychiatric:        Behavior: Behavior normal.      25 minutes was spent with the patient.  This statement verifies that 25 minutes was indeed spent with the patient.  More than 50% of this visit-total duration of the visit-was spent in counseling and coordination of care. The issues that the patient came in for today as reflected in the diagnosis (s) please refer to documentation for further details.      Assessment & Plan:  Rheumatoid arthritis takes his medicine tolerating the medicine his rheumatologist feels things are going good recent lab work looked good  Melanoma of the scalp he will be seeing a dermatologist specialist we will refer him he has an appointment in January for surgery on this  BPH PSA has been elevated but so far no prostate cancer follows with urology every 6 months  Chronic pain uses Tylenol 3 sparingly patient does not need refills currently we will follow him up in 6 months  Hyperlipidemia continue current measures lab work reviewed

## 2018-09-06 NOTE — Patient Instructions (Signed)

## 2018-09-07 LAB — LIPID PANEL
Chol/HDL Ratio: 3.9 ratio (ref 0.0–5.0)
Cholesterol, Total: 228 mg/dL — ABNORMAL HIGH (ref 100–199)
HDL: 59 mg/dL (ref >39)
LDL Calculated: 144 mg/dL — ABNORMAL HIGH (ref 0–99)
Triglycerides: 123 mg/dL (ref 0–149)
VLDL Cholesterol Cal: 25 mg/dL (ref 5–40)

## 2018-09-20 ENCOUNTER — Encounter: Payer: Self-pay | Admitting: Family Medicine

## 2018-09-20 ENCOUNTER — Ambulatory Visit (INDEPENDENT_AMBULATORY_CARE_PROVIDER_SITE_OTHER): Payer: PPO | Admitting: Family Medicine

## 2018-09-20 VITALS — BP 134/86 | Wt 192.4 lb

## 2018-09-20 DIAGNOSIS — E785 Hyperlipidemia, unspecified: Secondary | ICD-10-CM

## 2018-09-20 DIAGNOSIS — Z79899 Other long term (current) drug therapy: Secondary | ICD-10-CM | POA: Diagnosis not present

## 2018-09-20 MED ORDER — ATORVASTATIN CALCIUM 10 MG PO TABS: 10.0000 mg | ORAL_TABLET | Freq: Every day | ORAL | 3 refills | Status: DC

## 2018-09-20 NOTE — Progress Notes (Signed)
   Subjective:    Patient ID: Bryan Homans Sr., male    DOB: 05/09/1941, 78 y.o.   MRN: [id number]  HPI Pt here today to discuss starting medication. Pt had recent labs and lipid panel came back abnormal.  Significant time spent today discussing his cholesterol We talked about how this puts him at increased risk given his risk factors as well as his rheumatoid factor/disease  Review of Systems  Constitutional: Negative for activity change, fatigue and fever.  HENT: Negative for congestion and rhinorrhea.   Respiratory: Negative for cough and shortness of breath.   Cardiovascular: Negative for chest pain and leg swelling.  Gastrointestinal: Negative for abdominal pain, diarrhea and nausea.  Genitourinary: Negative for dysuria and hematuria.  Neurological: Negative for weakness and headaches.  Psychiatric/Behavioral: Negative for agitation and behavioral problems.       Objective:   Physical Exam Vitals signs reviewed.  Cardiovascular:     Rate and Rhythm: Normal rate and regular rhythm.     Heart sounds: Normal heart sounds. No murmur.  Pulmonary:     Effort: Pulmonary effort is normal.     Breath sounds: Normal breath sounds.  Lymphadenopathy:     Cervical: No cervical adenopathy.  Neurological:     Mental Status: He is alert.  Psychiatric:        Behavior: Behavior normal.    Patient has 29% risk of having an MI in the next 10 years       Assessment & Plan:  Hyperlipidemia After discussion of medication patient consents Atorvastatin 10 mg daily Information sheet on it given Check lipid liver profile in approximately 8 to 10 weeks with a follow-up office visit in 12 weeks follow-up sooner if progressive troubles or if worse

## 2018-09-22 DIAGNOSIS — C4441 Basal cell carcinoma of skin of scalp and neck: Secondary | ICD-10-CM | POA: Diagnosis not present

## 2018-09-22 HISTORY — PX: MELANOMA EXCISION: SHX5266

## 2018-09-24 NOTE — Progress Notes (Signed)
Office Visit Note  Patient: Bryan Vera Kroner Sr.             Date of Birth: 1940-11-18           MRN: [id number]             PCP: Kathyrn Drown, MD Referring: Kathyrn Drown, MD Visit Date: 10/08/2018 Occupation: @GUAROCC @  Subjective:  Medication management.   History of Present Illness: Bryan GILMER Sr. is a 78 y.o. male with history of rheumatoid arthritis.  Bryan Maldonado states Bryan Maldonado is not having any increased joint swelling.  Bryan Maldonado has been taking methotrexate 6 tablets/week.  Bryan Maldonado has some morning stiffness in his right arm today.  Bryan Maldonado also had some burning sensation in his throat feet.  Bilateral knee replacements are doing well.  Recently had another resection for melanoma on his scalp.  Bryan Maldonado is recovering well from that.  Activities of Daily Living:  Patient reports morning stiffness for 5-10 minutes.   Patient Reports nocturnal pain.  Difficulty dressing/grooming: Reports Difficulty climbing stairs: Reports Difficulty getting out of chair: Reports Difficulty using hands for taps, buttons, cutlery, and/or writing: Reports  Review of Systems  Constitutional: Positive for fatigue. Negative for night sweats.  HENT: Positive for mouth dryness and nose dryness. Negative for mouth sores.   Eyes: Positive for dryness. Negative for redness and itching.  Respiratory: Negative for shortness of breath, wheezing and difficulty breathing.   Cardiovascular: Negative for chest pain, palpitations, hypertension, irregular heartbeat and swelling in legs/feet.  Gastrointestinal: Positive for constipation and diarrhea. Negative for abdominal pain.  Endocrine: Negative for increased urination.  Genitourinary: Negative for painful urination, pelvic pain and urgency.  Musculoskeletal: Positive for arthralgias, joint pain and morning stiffness. Negative for joint swelling, myalgias, muscle weakness, muscle tenderness and myalgias.  Skin: Positive for rash. Negative for color change, hair loss, nodules/bumps, skin  tightness, ulcers and sensitivity to sunlight.  Allergic/Immunologic: Negative for susceptible to infections.  Neurological: Negative for dizziness, fainting, light-headedness, headaches, memory loss, night sweats and weakness.  Hematological: Negative for bruising/bleeding tendency and swollen glands.  Psychiatric/Behavioral: Negative for depressed mood and sleep disturbance. The patient is not nervous/anxious.     PMFS History:  Patient Active Problem List   Diagnosis Date Noted  . High risk medication use 09/19/2016  . History of total knee replacement, bilateral 09/19/2016  . Gastroesophageal reflux disease without esophagitis 09/19/2016  . History of melanoma 09/19/2016  . History of basal cell carcinoma 09/19/2016  . History of anemia 09/19/2016  . Allergic rhinitis 02/18/2016  . Melanoma of skin ([place]) 06/06/2015  . Prediabetes 11/20/2014  . Hyperlipidemia 11/20/2014  . BPH (benign prostatic hyperplasia) 05/22/2014  . Rheumatoid arthritis involving multiple sites with positive rheumatoid factor ([place]) 01/05/2013  . Chronic pain syndrome 01/05/2013    Past Medical History:  Diagnosis Date  . Anemia   . Basal cell carcinoma   . BPH (benign prostatic hyperplasia)   . GERD (gastroesophageal reflux disease)   . Melanoma ([place])   . Osteoarthritis   . Rheumatoid aortitis     Family History  Problem Relation Age of Onset  . Rheum arthritis Mother   . Emphysema Father   . Cancer Brother   . Emphysema Sister    Past Surgical History:  Procedure Laterality Date  . COLONOSCOPY    . HERNIA REPAIR    . JOINT REPLACEMENT Bilateral   . MELANOMA EXCISION    . MELANOMA EXCISION  09/22/2018   on head  Social History   Social History Narrative  . Not on file   Immunization History  Administered Date(s) Administered  . Influenza Split 05/19/2013  . Influenza,inj,Quad PF,6+ Mos 05/22/2014, 05/22/2015, 06/06/2016, 06/10/2017, 05/14/2018  . Pneumococcal Conjugate-13 05/22/2014   . Pneumococcal Polysaccharide-23 05/19/2013  . Zoster Recombinat (Shingrix) 09/06/2018     Objective: Vital Signs: BP 135/76 (BP Location: Left Arm, Patient Position: Sitting, Cuff Size: Normal)   Pulse 73   Resp 15   Ht 5\' 7"  (1.702 m)   Wt 188 lb (85.3 kg)   BMI 29.44 kg/m    Physical Exam Vitals signs and nursing note reviewed.  Constitutional:      Appearance: Bryan Maldonado is well-developed.  HENT:     Head: Normocephalic and atraumatic.  Eyes:     Conjunctiva/sclera: Conjunctivae normal.     Pupils: Pupils are equal, round, and reactive to light.  Neck:     Musculoskeletal: Normal range of motion and neck supple.  Cardiovascular:     Rate and Rhythm: Normal rate and regular rhythm.     Heart sounds: Normal heart sounds.  Pulmonary:     Effort: Pulmonary effort is normal.     Breath sounds: Normal breath sounds.  Abdominal:     General: Bowel sounds are normal.     Palpations: Abdomen is soft.  Skin:    General: Skin is warm and dry.     Capillary Refill: Capillary refill takes less than 2 seconds.  Neurological:     Mental Status: Bryan Maldonado is alert and oriented to person, place, and time.  Psychiatric:        Behavior: Behavior normal.      Musculoskeletal Exam: Cervical spine limited range of motion.  Shoulder joint abduction was limited to 90 degrees.  Bryan Maldonado has contracture in his elbows.  Bryan Maldonado has almost no range of motion in his wrist joints.  Bryan Maldonado has bilateral MCP thickening and subluxation.  Bryan Maldonado is subluxation of all of his PIPs.  Severe contractures in PIPs and MCPs were noted.  Bryan Maldonado had good range of motion of his hip joints.  His bilateral knee joints are replaced.  Bryan Maldonado has subluxation of bilateral ankle joints.  Bryan Maldonado has subluxation of all of his MTPs and PIPs.  Overcrowding of toes noted.  No synovitis was noted.  CDAI Exam: CDAI Score: 1.3  Patient Global Assessment: 6 (mm); Provider Global Assessment: 7 (mm) Swollen: 0 ; Tender: 0  Joint Exam   Not documented   There is  currently no information documented on the homunculus. Go to the Rheumatology activity and complete the homunculus joint exam.  Investigation: No additional findings.  Imaging: No results found.  Recent Labs: Lab Results  Component Value Date   WBC 7.6 08/13/2018   HGB 12.6 (L) 08/13/2018   PLT 210 08/13/2018   NA 140 08/13/2018   K 5.0 08/13/2018   CL 103 08/13/2018   CO2 29 08/13/2018   GLUCOSE 91 08/13/2018   BUN 21 08/13/2018   CREATININE 0.83 08/13/2018   BILITOT 0.9 08/13/2018   ALKPHOS 68 03/23/2017   AST 17 08/13/2018   ALT 10 08/13/2018   PROT 7.1 08/13/2018   ALBUMIN 4.2 03/23/2017   CALCIUM 9.7 08/13/2018   GFRAA 98 08/13/2018    Speciality Comments: No specialty comments available.  Procedures:  No procedures performed Allergies: Morphine and related and Acyclovir and related   Assessment / Plan:     Visit Diagnoses: Rheumatoid arthritis involving multiple sites with positive rheumatoid  factor (HCC) - Positive rheumatoid factor, positive anti-CCP, severe erosive disease with nodulosis and contractures.  Bryan Maldonado has severe arthritis which causes problems with daily activities.  But no synovitis was noted.  High risk medication use - MTX 6 tabs po q wk, folic acid 2 mg po qd.  His labs are stable.  We will continue to monitor labs every 3 months.  History of total knee replacement, bilateral-doing well.  Other medical problems are listed as follows:  History of hyperlipidemia  Prediabetes  Chronic pain syndrome-Bryan Maldonado continues to have generalized pain due to multiple contractures.  History of melanoma - Bryan Maldonado has been followed by dermatology.  History of basal cell carcinoma  History of gastroesophageal reflux (GERD)  History of anemia   Orders: No orders of the defined types were placed in this encounter.  No orders of the defined types were placed in this encounter.     Follow-Up Instructions: Return in about 5 months (around 03/08/2019) for  Rheumatoid arthritis.   Bo Merino, MD  Note - This record has been created using Editor, commissioning.  Chart creation errors have been sought, but may not always  have been located. Such creation errors do not reflect on  the standard of medical care.

## 2018-09-29 DIAGNOSIS — L570 Actinic keratosis: Secondary | ICD-10-CM | POA: Diagnosis not present

## 2018-09-29 DIAGNOSIS — Z85820 Personal history of malignant melanoma of skin: Secondary | ICD-10-CM | POA: Diagnosis not present

## 2018-09-29 DIAGNOSIS — Z08 Encounter for follow-up examination after completed treatment for malignant neoplasm: Secondary | ICD-10-CM | POA: Diagnosis not present

## 2018-09-29 DIAGNOSIS — D225 Melanocytic nevi of trunk: Secondary | ICD-10-CM | POA: Diagnosis not present

## 2018-09-29 DIAGNOSIS — Z1283 Encounter for screening for malignant neoplasm of skin: Secondary | ICD-10-CM | POA: Diagnosis not present

## 2018-09-29 DIAGNOSIS — X32XXXD Exposure to sunlight, subsequent encounter: Secondary | ICD-10-CM | POA: Diagnosis not present

## 2018-09-29 DIAGNOSIS — L281 Prurigo nodularis: Secondary | ICD-10-CM | POA: Diagnosis not present

## 2018-10-08 ENCOUNTER — Encounter: Payer: Self-pay | Admitting: Rheumatology

## 2018-10-08 ENCOUNTER — Ambulatory Visit: Payer: PPO | Admitting: Rheumatology

## 2018-10-08 VITALS — BP 135/76 | HR 73 | Resp 15 | Ht 67.0 in | Wt 188.0 lb

## 2018-10-08 DIAGNOSIS — R7303 Prediabetes: Secondary | ICD-10-CM

## 2018-10-08 DIAGNOSIS — Z8639 Personal history of other endocrine, nutritional and metabolic disease: Secondary | ICD-10-CM | POA: Diagnosis not present

## 2018-10-08 DIAGNOSIS — Z862 Personal history of diseases of the blood and blood-forming organs and certain disorders involving the immune mechanism: Secondary | ICD-10-CM

## 2018-10-08 DIAGNOSIS — Z85820 Personal history of malignant melanoma of skin: Secondary | ICD-10-CM

## 2018-10-08 DIAGNOSIS — G894 Chronic pain syndrome: Secondary | ICD-10-CM | POA: Diagnosis not present

## 2018-10-08 DIAGNOSIS — Z79899 Other long term (current) drug therapy: Secondary | ICD-10-CM

## 2018-10-08 DIAGNOSIS — Z8719 Personal history of other diseases of the digestive system: Secondary | ICD-10-CM | POA: Diagnosis not present

## 2018-10-08 DIAGNOSIS — M0579 Rheumatoid arthritis with rheumatoid factor of multiple sites without organ or systems involvement: Secondary | ICD-10-CM | POA: Diagnosis not present

## 2018-10-08 DIAGNOSIS — Z96653 Presence of artificial knee joint, bilateral: Secondary | ICD-10-CM | POA: Diagnosis not present

## 2018-10-08 DIAGNOSIS — Z85828 Personal history of other malignant neoplasm of skin: Secondary | ICD-10-CM | POA: Diagnosis not present

## 2018-10-08 NOTE — Patient Instructions (Addendum)
Standing Labs We placed an order today for your standing lab work.    Please come back and get your standing labs in March and then every 3 months.  We have open lab Monday through Friday from 8:30-11:30 AM and 1:30-4:00 PM  at the office of Dr. Bo Merino.   You may experience shorter wait times on Monday and Friday afternoons. The office is located at [address], [place], [place], [place] [address] No appointment is necessary.   Labs are drawn by [facility].  You may receive a bill from [facility] for your lab work.  If you wish to have your labs drawn at another location, please call the office 24 hours in advance to send orders.  If you have any questions regarding directions or hours of operation,  please call [phone].   Just as a reminder please drink plenty of water prior to coming for your lab work. Thanks!

## 2018-11-01 DIAGNOSIS — R972 Elevated prostate specific antigen [PSA]: Secondary | ICD-10-CM | POA: Diagnosis not present

## 2018-11-01 DIAGNOSIS — N401 Enlarged prostate with lower urinary tract symptoms: Secondary | ICD-10-CM | POA: Diagnosis not present

## 2018-11-01 DIAGNOSIS — R351 Nocturia: Secondary | ICD-10-CM | POA: Diagnosis not present

## 2018-11-09 ENCOUNTER — Other Ambulatory Visit: Payer: Self-pay | Admitting: Urology

## 2018-11-09 DIAGNOSIS — R972 Elevated prostate specific antigen [PSA]: Secondary | ICD-10-CM

## 2018-11-12 ENCOUNTER — Other Ambulatory Visit: Payer: Self-pay

## 2018-11-12 DIAGNOSIS — Z79899 Other long term (current) drug therapy: Secondary | ICD-10-CM | POA: Diagnosis not present

## 2018-11-13 LAB — CBC WITH DIFFERENTIAL/PLATELET
Absolute Monocytes: 416 cells/uL (ref 200–950)
Basophils Absolute: 32 cells/uL (ref 0–200)
Basophils Relative: 0.5 %
EOS ABS: 128 {cells}/uL (ref 15–500)
Eosinophils Relative: 2 %
HCT: 34.9 % — ABNORMAL LOW (ref 38.5–50.0)
Hemoglobin: 11.7 g/dL — ABNORMAL LOW (ref 13.2–17.1)
Lymphs Abs: 2976 cells/uL (ref 850–3900)
MCH: 32.7 pg (ref 27.0–33.0)
MCHC: 33.5 g/dL (ref 32.0–36.0)
MCV: 97.5 fL (ref 80.0–100.0)
MONOS PCT: 6.5 %
MPV: 10.1 fL (ref 7.5–12.5)
Neutro Abs: 2848 cells/uL (ref 1500–7800)
Neutrophils Relative %: 44.5 %
Platelets: 192 10*3/uL (ref 140–400)
RBC: 3.58 10*6/uL — ABNORMAL LOW (ref 4.20–5.80)
RDW: 13.9 % (ref 11.0–15.0)
Total Lymphocyte: 46.5 %
WBC: 6.4 10*3/uL (ref 3.8–10.8)

## 2018-11-13 LAB — COMPLETE METABOLIC PANEL WITH GFR
AG Ratio: 1.8 (calc) (ref 1.0–2.5)
ALBUMIN MSPROF: 4.1 g/dL (ref 3.6–5.1)
ALT: 11 U/L (ref 9–46)
AST: 17 U/L (ref 10–35)
Alkaline phosphatase (APISO): 62 U/L (ref 35–144)
BUN: 14 mg/dL (ref 7–25)
CHLORIDE: 108 mmol/L (ref 98–110)
CO2: 29 mmol/L (ref 20–32)
Calcium: 9 mg/dL (ref 8.6–10.3)
Creat: 0.76 mg/dL (ref 0.70–1.18)
GFR, Est African American: 101 mL/min/{1.73_m2} (ref >=60)
GFR, Est Non African American: 87 mL/min/{1.73_m2} (ref >=60)
Globulin: 2.3 g/dL (calc) (ref 1.9–3.7)
Glucose, Bld: 96 mg/dL (ref 65–99)
Potassium: 4.6 mmol/L (ref 3.5–5.3)
Sodium: 143 mmol/L (ref 135–146)
Total Bilirubin: 0.8 mg/dL (ref 0.2–1.2)
Total Protein: 6.4 g/dL (ref 6.1–8.1)

## 2018-11-19 ENCOUNTER — Other Ambulatory Visit: Payer: Self-pay

## 2018-11-19 ENCOUNTER — Ambulatory Visit
Admission: RE | Admit: 2018-11-19 | Discharge: 2018-11-19 | Disposition: A | Discharge status: Home / Self Care | Payer: PPO | Source: Ambulatory Visit | Attending: Urology | Admitting: Urology

## 2018-11-19 DIAGNOSIS — R972 Elevated prostate specific antigen [PSA]: Secondary | ICD-10-CM | POA: Diagnosis not present

## 2018-11-19 MED ORDER — GADOBENATE DIMEGLUMINE 529 MG/ML IV SOLN
18.0000 mL | Freq: Once | INTRAVENOUS | Status: AC | PRN
  Administered 2018-11-19: 18 mL via INTRAVENOUS

## 2018-11-27 ENCOUNTER — Other Ambulatory Visit: Payer: Self-pay | Admitting: Rheumatology

## 2018-11-29 NOTE — Telephone Encounter (Signed)
Last Visit: 10/08/18 Next visit: 03/11/19 Labs: 11/12/18 CMP WNL. CBC stable.  Okay to refill per Dr. Estanislado Pandy

## 2018-12-02 ENCOUNTER — Telehealth: Payer: Self-pay | Admitting: Family Medicine

## 2018-12-03 NOTE — Telephone Encounter (Signed)
ERROR

## 2018-12-22 ENCOUNTER — Other Ambulatory Visit: Payer: Self-pay

## 2018-12-22 ENCOUNTER — Ambulatory Visit (INDEPENDENT_AMBULATORY_CARE_PROVIDER_SITE_OTHER): Payer: PPO | Admitting: Family Medicine

## 2018-12-22 DIAGNOSIS — M0579 Rheumatoid arthritis with rheumatoid factor of multiple sites without organ or systems involvement: Secondary | ICD-10-CM

## 2018-12-22 MED ORDER — ACETAMINOPHEN-CODEINE #3 300-30 MG PO TABS: ORAL_TABLET | ORAL | 0 refills | Status: DC

## 2018-12-22 NOTE — Progress Notes (Signed)
   Subjective:    Patient ID: Bryan Homans Sr., male    DOB: Apr 16, 1941, 78 y.o.   MRN: [id number] Telephone visit Video not available Patient at home I was at office coronavirus outbreak HPI This patient was seen today for chronic pain Patient relates he does use pain medicine intermittently to help him with pain and discomfort he denies abusing it.  Uses it sparingly does not cause drowsiness He is staying at home to help minimize risk of coronavirus questions were asked and answered. Bryan Maldonado this is Dr. Nicki Reaper looking The medication list was reviewed and updated.   -Compliance with medication: just as needed maybe 2 a week  - Number patient states they take daily: just as needed about 2 pills a week  -when was the last dose patient took? 2 days ago  The patient was advised the importance of maintaining medication and not using illegal substances with these.  Here for refills and follow up  The patient was educated that we can provide 3 monthly scripts for their medication, it is their responsibility to follow the instructions.  Side effects or complications from medications: none  Patient is aware that pain medications are meant to minimize the severity of the pain to allow their pain levels to improve to allow for better function. They are aware of that pain medications cannot totally remove their pain.  Due for UDT ( at least once per year) : has not done one  Virtual Visit via Telephone Note  I connected with Bryan Mans Zahner Sr. on 12/22/18 at  2:00 PM EDT by telephone and verified that I am speaking with the correct person using two identifiers.   I discussed the limitations, risks, security and privacy concerns of performing an evaluation and management service by telephone and the availability of in person appointments. I also discussed with the patient that there may be a patient responsible charge related to this service. The patient expressed understanding and agreed to  proceed.   History of Present Illness:    Observations/Objective:   Assessment and Plan:   Follow Up Instructions:    I discussed the assessment and treatment plan with the patient. The patient was provided an opportunity to ask questions and all were answered. The patient agreed with the plan and demonstrated an understanding of the instructions.   The patient was advised to call back or seek an in-person evaluation if the symptoms worsen or if the condition fails to improve as anticipated.  I provided 15 minutes of non-face-to-face time during this encounter.           Review of Systems     Objective:   Physical Exam        Assessment & Plan:  Rheumatoid arthritis continue with medication specialist has him on methotrexate and follow-up with specialist regular basis  Tylenol 3 when necessary for rheumatoid arthritis pain he does not use it frequently caution drowsiness  Hyperlipidemia does take cholesterol medicine regular basis.  Will follow-up in several months time.  Lab work in the summertime.  None currently.

## 2019-02-06 ENCOUNTER — Other Ambulatory Visit: Payer: Self-pay | Admitting: Family Medicine

## 2019-02-10 ENCOUNTER — Other Ambulatory Visit: Payer: Self-pay | Admitting: Rheumatology

## 2019-02-10 ENCOUNTER — Telehealth: Payer: Self-pay | Admitting: *Deleted

## 2019-02-10 DIAGNOSIS — E785 Hyperlipidemia, unspecified: Secondary | ICD-10-CM

## 2019-02-10 NOTE — Telephone Encounter (Signed)
Pt has appt July 6th and wants to know if he needs bloodwork. Last labs 09/20/18 liver, lipid,

## 2019-02-10 NOTE — Telephone Encounter (Signed)
Please advise 

## 2019-02-11 NOTE — Telephone Encounter (Signed)
Pt contacted and informed that he only needs lipid profile. Pt states that he would like to have labs mailed.

## 2019-02-11 NOTE — Telephone Encounter (Signed)
Last visit: 10/08/2018 Next visit: 03/11/2019 Labs: CMP WNL. CBC stable. We will continue to monitor. 11/12/2018   Last prescribed 11/29/2018 Please advise.

## 2019-02-11 NOTE — Telephone Encounter (Signed)
I would recommend lipid profile Please let patient know was able to see the lab work he had completed in March Only needs lipid profile currently

## 2019-02-11 NOTE — Telephone Encounter (Signed)
Please advise patient to update labs ASAP.   Ok to refill.

## 2019-02-11 NOTE — Telephone Encounter (Signed)
LMOM for patient to have labs updated ASAP, MTX refilled

## 2019-02-12 ENCOUNTER — Other Ambulatory Visit: Payer: Self-pay | Admitting: Family Medicine

## 2019-02-14 ENCOUNTER — Other Ambulatory Visit: Payer: Self-pay

## 2019-02-14 DIAGNOSIS — Z79899 Other long term (current) drug therapy: Secondary | ICD-10-CM

## 2019-02-15 LAB — COMPLETE METABOLIC PANEL WITH GFR
AG Ratio: 1.8 (calc) (ref 1.0–2.5)
ALT: 11 U/L (ref 9–46)
AST: 18 U/L (ref 10–35)
Albumin: 4.2 g/dL (ref 3.6–5.1)
Alkaline phosphatase (APISO): 68 U/L (ref 35–144)
BUN: 15 mg/dL (ref 7–25)
CO2: 28 mmol/L (ref 20–32)
Calcium: 9.3 mg/dL (ref 8.6–10.3)
Chloride: 104 mmol/L (ref 98–110)
Creat: 0.79 mg/dL (ref 0.70–1.18)
GFR, Est African American: 100 mL/min/{1.73_m2} (ref >=60)
GFR, Est Non African American: 86 mL/min/{1.73_m2} (ref >=60)
Globulin: 2.3 g/dL (calc) (ref 1.9–3.7)
Glucose, Bld: 110 mg/dL — ABNORMAL HIGH (ref 65–99)
Potassium: 4.6 mmol/L (ref 3.5–5.3)
Sodium: 140 mmol/L (ref 135–146)
Total Bilirubin: 0.7 mg/dL (ref 0.2–1.2)
Total Protein: 6.5 g/dL (ref 6.1–8.1)

## 2019-02-15 LAB — CBC WITH DIFFERENTIAL/PLATELET
Absolute Monocytes: 502 cells/uL (ref 200–950)
Basophils Absolute: 19 cells/uL (ref 0–200)
Basophils Relative: 0.3 %
Eosinophils Absolute: 93 cells/uL (ref 15–500)
Eosinophils Relative: 1.5 %
HCT: 36.3 % — ABNORMAL LOW (ref 38.5–50.0)
Hemoglobin: 11.9 g/dL — ABNORMAL LOW (ref 13.2–17.1)
Lymphs Abs: 2499 cells/uL (ref 850–3900)
MCH: 32.9 pg (ref 27.0–33.0)
MCHC: 32.8 g/dL (ref 32.0–36.0)
MCV: 100.3 fL — ABNORMAL HIGH (ref 80.0–100.0)
MPV: 9.9 fL (ref 7.5–12.5)
Monocytes Relative: 8.1 %
Neutro Abs: 3088 cells/uL (ref 1500–7800)
Neutrophils Relative %: 49.8 %
Platelets: 175 10*3/uL (ref 140–400)
RBC: 3.62 10*6/uL — ABNORMAL LOW (ref 4.20–5.80)
RDW: 13.5 % (ref 11.0–15.0)
Total Lymphocyte: 40.3 %
WBC: 6.2 10*3/uL (ref 3.8–10.8)

## 2019-02-15 NOTE — Progress Notes (Signed)
Labs are stable.

## 2019-02-22 DIAGNOSIS — E785 Hyperlipidemia, unspecified: Secondary | ICD-10-CM | POA: Diagnosis not present

## 2019-02-23 ENCOUNTER — Encounter: Payer: Self-pay | Admitting: Family Medicine

## 2019-02-23 LAB — LIPID PANEL
Chol/HDL Ratio: 2.4 ratio (ref 0.0–5.0)
Cholesterol, Total: 153 mg/dL (ref 100–199)
HDL: 63 mg/dL (ref >39)
LDL Calculated: 69 mg/dL (ref 0–99)
Triglycerides: 103 mg/dL (ref 0–149)
VLDL Cholesterol Cal: 21 mg/dL (ref 5–40)

## 2019-02-25 NOTE — Progress Notes (Signed)
Office Visit Note  Patient: Bryan Andreoni Winfield Sr.             Date of Birth: 08/12/1941           MRN: [id number]             PCP: Kathyrn Drown, MD Referring: Kathyrn Drown, MD Visit Date: 03/11/2019 Occupation: @GUAROCC @  Subjective:  Pain in both feet    History of Present Illness: Bryan NO Sr. is a 78 y.o. male with history of seropositive rheumatoid arthritis.  He is taking Methotrexate 6 tablets po once weekly and folic acid 2 mg po daily. He denies any recent flares.  He denies any increased joint swelling or joint pain. He reports chronic pain in both ankle joints and both feet.  He has difficulty findings comfortable shoes.  He has pedal edema bilaterally.  He reports both knee replacements are doing well.  Activities of Daily Living:  Patient reports morning stiffness for 0 minutes.   Patient Denies nocturnal pain.  Difficulty dressing/grooming: Reports Difficulty climbing stairs: Denies Difficulty getting out of chair: Denies Difficulty using hands for taps, buttons, cutlery, and/or writing: Reports  Review of Systems  Constitutional: Negative for fatigue.  HENT: Positive for mouth dryness and nose dryness. Negative for mouth sores.   Eyes: Positive for dryness. Negative for itching.  Respiratory: Negative for shortness of breath, wheezing and difficulty breathing.   Cardiovascular: Negative for chest pain, palpitations and swelling in legs/feet.  Gastrointestinal: Negative for abdominal pain, constipation and diarrhea.  Endocrine: Negative for increased urination.  Genitourinary: Negative for painful urination and pelvic pain.  Musculoskeletal: Positive for arthralgias, joint pain and joint swelling. Negative for morning stiffness.  Skin: Negative for color change and skin tightness.  Allergic/Immunologic: Negative for susceptible to infections.  Neurological: Negative for dizziness, light-headedness, headaches, memory loss and weakness.  Hematological:  Positive for bruising/bleeding tendency.  Psychiatric/Behavioral: Negative for confusion. The patient is not nervous/anxious.     PMFS History:  Patient Active Problem List   Diagnosis Date Noted  . High risk medication use 09/19/2016  . History of total knee replacement, bilateral 09/19/2016  . Gastroesophageal reflux disease without esophagitis 09/19/2016  . History of melanoma 09/19/2016  . History of basal cell carcinoma 09/19/2016  . History of anemia 09/19/2016  . Allergic rhinitis 02/18/2016  . Melanoma of skin ([place]) 06/06/2015  . Prediabetes 11/20/2014  . Hyperlipidemia 11/20/2014  . BPH (benign prostatic hyperplasia) 05/22/2014  . Rheumatoid arthritis involving multiple sites with positive rheumatoid factor ([facility]) 01/05/2013  . Chronic pain syndrome 01/05/2013    Past Medical History:  Diagnosis Date  . Anemia   . Basal cell carcinoma   . BPH (benign prostatic hyperplasia)   . GERD (gastroesophageal reflux disease)   . Melanoma ([place])   . Osteoarthritis   . Rheumatoid aortitis     Family History  Problem Relation Age of Onset  . Rheum arthritis Mother   . Emphysema Father   . Cancer Brother   . Emphysema Sister    Past Surgical History:  Procedure Laterality Date  . COLONOSCOPY    . HERNIA REPAIR    . JOINT REPLACEMENT Bilateral   . MELANOMA EXCISION    . MELANOMA EXCISION  09/22/2018   on head   Social History   Social History Narrative  . Not on file   Immunization History  Administered Date(s) Administered  . Influenza Split 05/19/2013  . Influenza,inj,Quad PF,6+ Mos 05/22/2014, 05/22/2015, 06/06/2016,  06/10/2017, 05/14/2018  . Pneumococcal Conjugate-13 05/22/2014  . Pneumococcal Polysaccharide-23 05/19/2013  . Zoster Recombinat (Shingrix) 09/06/2018     Objective: Vital Signs: BP (!) 156/91 (BP Location: Left Arm, Patient Position: Sitting, Cuff Size: Normal)   Pulse 67   Resp 14   Ht 5\' 8"  (1.727 m)   Wt 185 lb (83.9 kg)   BMI 28.13 kg/m     Physical Exam Vitals signs and nursing note reviewed.  Constitutional:      Appearance: He is well-developed.  HENT:     Head: Normocephalic and atraumatic.  Eyes:     Conjunctiva/sclera: Conjunctivae normal.     Pupils: Pupils are equal, round, and reactive to light.  Neck:     Musculoskeletal: Normal range of motion and neck supple.  Cardiovascular:     Rate and Rhythm: Normal rate and regular rhythm.     Heart sounds: Normal heart sounds.  Pulmonary:     Effort: Pulmonary effort is normal.     Breath sounds: Normal breath sounds.  Abdominal:     General: Bowel sounds are normal.     Palpations: Abdomen is soft.  Skin:    General: Skin is warm and dry.     Capillary Refill: Capillary refill takes less than 2 seconds.  Neurological:     Mental Status: He is alert and oriented to person, place, and time.  Psychiatric:        Behavior: Behavior normal.      Musculoskeletal Exam: C-spine limited range of motion.  Thoracic kyphosis noted.  Shoulder abduction to 90 degrees bilaterally.  He has contractures of bilateral elbow joints.  Extremely minimal range of motion of bilateral wrist joints.  He has synovial thickening of all MCP joints and subluxation.  Subluxation and contractures of PIP joints.  He has severe contractures of PIP and MCP joints.  Hip joints have good range of motion.  Bilateral knee replacements have good range of motion with no warmth or effusion.  Is subluxation of bilateral ankle joints.  He has subluxation of all MTP joints and PIPs.  Overcrowding of toes was noted.  He has pedal edema bilaterally.  CDAI Exam: CDAI Score: 0.8  Patient Global: 4 mm; Provider Global: 4 mm Swollen: 0 ; Tender: 0  Joint Exam   No joint exam has been documented for this visit   There is currently no information documented on the homunculus. Go to the Rheumatology activity and complete the homunculus joint exam.  Investigation: No additional findings.  Imaging: No  results found.  Recent Labs: Lab Results  Component Value Date   WBC 6.2 02/14/2019   HGB 11.9 (L) 02/14/2019   PLT 175 02/14/2019   NA 140 02/14/2019   K 4.6 02/14/2019   CL 104 02/14/2019   CO2 28 02/14/2019   GLUCOSE 110 (H) 02/14/2019   BUN 15 02/14/2019   CREATININE 0.79 02/14/2019   BILITOT 0.7 02/14/2019   ALKPHOS 68 03/23/2017   AST 18 02/14/2019   ALT 11 02/14/2019   PROT 6.5 02/14/2019   ALBUMIN 4.2 03/23/2017   CALCIUM 9.3 02/14/2019   GFRAA 100 02/14/2019    Speciality Comments: No specialty comments available.  Procedures:  No procedures performed Allergies: Morphine and related and Acyclovir and related   Assessment / Plan:     Visit Diagnoses: Rheumatoid arthritis involving multiple sites with positive rheumatoid factor (HCC) - Positive rheumatoid factor, positive anti-CCP, severe erosive disease with nodulosis and contractures: He has no active synovitis  at this time.  He has not had any recent rheumatoid arthritis flares.  He is clinically doing well on Methotrexate 6 tablets by mouth once weekly and folic acid 2 mg po daily.  He has not missed any doses recently.  He has not had any recent infections.  He has chronic pain in bilateral ankle joints and bilateral feet.  He has synovial thickening and contractures in multiple joints.  He will continue on the current treatment regimen.  He does not need any refills at this time.  He has been to notify us develops increased joint pain or joint swelling.  He will follow-up in the office in 5 months.  High risk medication use -Methotrexate 2.5 mg 6 tablets every 7 days and folic acid 1 mg 2 tablets daily.  Most recent CBC/CMP stable on 02/14/2019.  Due for CBC/CMP in September and will monitor every 3 months.  Standing orders placed.  He received the flu vaccine in September and is up-to-date with pneumonia and shingles vaccines.-- Plan: CBC with Differential/Platelet, COMPLETE METABOLIC PANEL WITH GFR  History of total  knee replacement, bilateral - Doing well.  He has good ROM with no warmth or effusion of knee joints.  He has no discomfort at this time.  Chronic pain syndrome -He takes Tylenol #3 for pain relief.   Other medical conditions are listed as follows:   Prediabetes   History of melanoma - He has been followed by dermatology  History of gastroesophageal reflux (GERD)   History of basal cell carcinoma   History of hyperlipidemia  History of anemia   Orders: Orders Placed This Encounter  Procedures  . CBC with Differential/Platelet  . COMPLETE METABOLIC PANEL WITH GFR   No orders of the defined types were placed in this encounter.     Follow-Up Instructions: Return in about 5 months (around 08/11/2019) for Rheumatoid arthritis.   Ofilia Neas, PA-C   I examined and evaluated the patient with Hazel Sams PA.  Patient has multiple contractures and joint deformities.  He had no active synovitis on examination.  We will continue current regimen.  The plan of care was discussed as noted above.  Bo Merino, MD  Note - This record has been created using Editor, commissioning.  Chart creation errors have been sought, but may not always  have been located. Such creation errors do not reflect on  the standard of medical care.

## 2019-03-07 ENCOUNTER — Ambulatory Visit: Payer: Self-pay | Admitting: Family Medicine

## 2019-03-11 ENCOUNTER — Encounter: Payer: Self-pay | Admitting: Rheumatology

## 2019-03-11 ENCOUNTER — Other Ambulatory Visit: Payer: Self-pay

## 2019-03-11 ENCOUNTER — Ambulatory Visit: Payer: PPO | Admitting: Rheumatology

## 2019-03-11 VITALS — BP 156/91 | HR 67 | Resp 14 | Ht 68.0 in | Wt 185.0 lb

## 2019-03-11 DIAGNOSIS — Z79899 Other long term (current) drug therapy: Secondary | ICD-10-CM | POA: Diagnosis not present

## 2019-03-11 DIAGNOSIS — Z862 Personal history of diseases of the blood and blood-forming organs and certain disorders involving the immune mechanism: Secondary | ICD-10-CM | POA: Diagnosis not present

## 2019-03-11 DIAGNOSIS — M0579 Rheumatoid arthritis with rheumatoid factor of multiple sites without organ or systems involvement: Secondary | ICD-10-CM

## 2019-03-11 DIAGNOSIS — Z8639 Personal history of other endocrine, nutritional and metabolic disease: Secondary | ICD-10-CM | POA: Diagnosis not present

## 2019-03-11 DIAGNOSIS — R7303 Prediabetes: Secondary | ICD-10-CM | POA: Diagnosis not present

## 2019-03-11 DIAGNOSIS — Z96653 Presence of artificial knee joint, bilateral: Secondary | ICD-10-CM

## 2019-03-11 DIAGNOSIS — Z85820 Personal history of malignant melanoma of skin: Secondary | ICD-10-CM | POA: Diagnosis not present

## 2019-03-11 DIAGNOSIS — G894 Chronic pain syndrome: Secondary | ICD-10-CM

## 2019-03-11 DIAGNOSIS — Z8719 Personal history of other diseases of the digestive system: Secondary | ICD-10-CM

## 2019-03-11 DIAGNOSIS — Z85828 Personal history of other malignant neoplasm of skin: Secondary | ICD-10-CM

## 2019-03-11 NOTE — Patient Instructions (Signed)
Standing Labs We placed an order today for your standing lab work.    Please come back and get your standing labs in Mid-September and every 3 months   We have open lab daily Monday through Thursday from 8:30-12:30 PM and 1:30-4:30 PM and Friday from 8:30-12:30 PM and 1:30 -4:00 PM at the office of Dr. Bo Merino.   You may experience shorter wait times on Monday and Friday afternoons. The office is located at [address] [place], [place], [place] [address] No appointment is necessary.   Labs are drawn by [facility].  You may receive a bill from [facility] for your lab work.  If you wish to have your labs drawn at another location, please call the office 24 hours in advance to send orders.  If you have any questions regarding directions or hours of operation,  please call [phone].   Just as a reminder please drink plenty of water prior to coming for your lab work. Thanks!

## 2019-03-16 NOTE — Telephone Encounter (Signed)
error 

## 2019-03-27 ENCOUNTER — Other Ambulatory Visit: Payer: Self-pay | Admitting: Family Medicine

## 2019-03-30 ENCOUNTER — Other Ambulatory Visit: Payer: Self-pay | Admitting: Family Medicine

## 2019-03-31 NOTE — Telephone Encounter (Signed)
Duplicate

## 2019-04-06 DIAGNOSIS — Z1283 Encounter for screening for malignant neoplasm of skin: Secondary | ICD-10-CM | POA: Diagnosis not present

## 2019-04-06 DIAGNOSIS — Z85820 Personal history of malignant melanoma of skin: Secondary | ICD-10-CM | POA: Diagnosis not present

## 2019-04-06 DIAGNOSIS — C44629 Squamous cell carcinoma of skin of left upper limb, including shoulder: Secondary | ICD-10-CM | POA: Diagnosis not present

## 2019-04-06 DIAGNOSIS — Z08 Encounter for follow-up examination after completed treatment for malignant neoplasm: Secondary | ICD-10-CM | POA: Diagnosis not present

## 2019-04-06 DIAGNOSIS — D225 Melanocytic nevi of trunk: Secondary | ICD-10-CM | POA: Diagnosis not present

## 2019-05-06 DIAGNOSIS — R351 Nocturia: Secondary | ICD-10-CM | POA: Diagnosis not present

## 2019-05-06 DIAGNOSIS — R972 Elevated prostate specific antigen [PSA]: Secondary | ICD-10-CM | POA: Diagnosis not present

## 2019-05-06 DIAGNOSIS — N401 Enlarged prostate with lower urinary tract symptoms: Secondary | ICD-10-CM | POA: Diagnosis not present

## 2019-05-10 ENCOUNTER — Other Ambulatory Visit: Payer: Self-pay | Admitting: *Deleted

## 2019-05-10 DIAGNOSIS — Z79899 Other long term (current) drug therapy: Secondary | ICD-10-CM

## 2019-05-11 LAB — COMPLETE METABOLIC PANEL WITH GFR
AG Ratio: 1.9 (calc) (ref 1.0–2.5)
ALT: 12 U/L (ref 9–46)
AST: 18 U/L (ref 10–35)
Albumin: 4.1 g/dL (ref 3.6–5.1)
Alkaline phosphatase (APISO): 63 U/L (ref 35–144)
BUN/Creatinine Ratio: 23 (calc) — ABNORMAL HIGH (ref 6–22)
BUN: 16 mg/dL (ref 7–25)
CO2: 28 mmol/L (ref 20–32)
Calcium: 9 mg/dL (ref 8.6–10.3)
Chloride: 107 mmol/L (ref 98–110)
Creat: 0.69 mg/dL — ABNORMAL LOW (ref 0.70–1.18)
GFR, Est African American: 105 mL/min/{1.73_m2} (ref >=60)
GFR, Est Non African American: 91 mL/min/{1.73_m2} (ref >=60)
Globulin: 2.2 g/dL (calc) (ref 1.9–3.7)
Glucose, Bld: 123 mg/dL — ABNORMAL HIGH (ref 65–99)
Potassium: 4.3 mmol/L (ref 3.5–5.3)
Sodium: 141 mmol/L (ref 135–146)
Total Bilirubin: 1.3 mg/dL — ABNORMAL HIGH (ref 0.2–1.2)
Total Protein: 6.3 g/dL (ref 6.1–8.1)

## 2019-05-11 LAB — CBC WITH DIFFERENTIAL/PLATELET
Absolute Monocytes: 416 cells/uL (ref 200–950)
Basophils Absolute: 8 cells/uL (ref 0–200)
Basophils Relative: 0.2 %
Eosinophils Absolute: 59 cells/uL (ref 15–500)
Eosinophils Relative: 1.4 %
HCT: 33.6 % — ABNORMAL LOW (ref 38.5–50.0)
Hemoglobin: 11.4 g/dL — ABNORMAL LOW (ref 13.2–17.1)
Lymphs Abs: 2100 cells/uL (ref 850–3900)
MCH: 34.2 pg — ABNORMAL HIGH (ref 27.0–33.0)
MCHC: 33.9 g/dL (ref 32.0–36.0)
MCV: 100.9 fL — ABNORMAL HIGH (ref 80.0–100.0)
MPV: 9.9 fL (ref 7.5–12.5)
Monocytes Relative: 9.9 %
Neutro Abs: 1617 cells/uL (ref 1500–7800)
Neutrophils Relative %: 38.5 %
Platelets: 154 10*3/uL (ref 140–400)
RBC: 3.33 10*6/uL — ABNORMAL LOW (ref 4.20–5.80)
RDW: 13.8 % (ref 11.0–15.0)
Total Lymphocyte: 50 %
WBC: 4.2 10*3/uL (ref 3.8–10.8)

## 2019-05-11 NOTE — Progress Notes (Signed)
Glucose is 123.  Total bilirubin borderline elevated.   Hgb and hct are low but stable.  MCV and MCH are mildly elevated.  Please advise patient to take folic acid 2 mg po daily.  Please forward labs to PCP.

## 2019-05-13 ENCOUNTER — Telehealth: Payer: Self-pay | Admitting: *Deleted

## 2019-05-13 MED ORDER — FOLIC ACID 1 MG PO TABS: 2.0000 mg | ORAL_TABLET | Freq: Every day | ORAL | 3 refills | Status: DC

## 2019-05-13 NOTE — Telephone Encounter (Signed)
-----   Message from Ofilia Neas, PA-C sent at 05/11/2019  8:07 AM EDT ----- Glucose is 123.  Total bilirubin borderline elevated.   Hgb and hct are low but stable.  MCV and MCH are mildly elevated.  Please advise patient to take folic acid 2 mg po daily.  Please forward labs to PCP.

## 2019-05-13 NOTE — Telephone Encounter (Signed)
Last Visit: 03/11/19 Next visit: 08/12/19  Okay to refill Folic Acid per Dr. Estanislado Pandy

## 2019-05-16 ENCOUNTER — Other Ambulatory Visit: Payer: Self-pay | Admitting: Rheumatology

## 2019-05-16 NOTE — Telephone Encounter (Signed)
Last Visit: 03/11/19 Next Visit: 08/12/19 Labs: 05/10/19 Glucose is 123. Total bilirubin borderline elevated.   Hgb and hct are low but stable. MCV and MCH are mildly elevated.  Okay to refill per Dr. Estanislado Pandy

## 2019-05-18 DIAGNOSIS — Z08 Encounter for follow-up examination after completed treatment for malignant neoplasm: Secondary | ICD-10-CM | POA: Diagnosis not present

## 2019-05-18 DIAGNOSIS — Z85828 Personal history of other malignant neoplasm of skin: Secondary | ICD-10-CM | POA: Diagnosis not present

## 2019-05-18 DIAGNOSIS — C4441 Basal cell carcinoma of skin of scalp and neck: Secondary | ICD-10-CM | POA: Diagnosis not present

## 2019-06-29 DIAGNOSIS — Z08 Encounter for follow-up examination after completed treatment for malignant neoplasm: Secondary | ICD-10-CM | POA: Diagnosis not present

## 2019-06-29 DIAGNOSIS — Z85828 Personal history of other malignant neoplasm of skin: Secondary | ICD-10-CM | POA: Diagnosis not present

## 2019-06-29 DIAGNOSIS — C44319 Basal cell carcinoma of skin of other parts of face: Secondary | ICD-10-CM | POA: Diagnosis not present

## 2019-06-29 DIAGNOSIS — Z1283 Encounter for screening for malignant neoplasm of skin: Secondary | ICD-10-CM | POA: Diagnosis not present

## 2019-07-22 ENCOUNTER — Telehealth: Payer: Self-pay | Admitting: Family Medicine

## 2019-07-26 NOTE — Telephone Encounter (Signed)
Please call and schedule appt. thanks

## 2019-07-26 NOTE — Telephone Encounter (Signed)
Recommend virtual visit within the next 6 weeks refill given

## 2019-07-26 NOTE — Telephone Encounter (Signed)
Pt is scheduled for virtual visit 1/5

## 2019-08-08 ENCOUNTER — Other Ambulatory Visit: Payer: Self-pay | Admitting: Rheumatology

## 2019-08-08 NOTE — Telephone Encounter (Signed)
Last Visit: 03/11/2019 Next Visit: 08/12/2019 Labs: 05/10/2019 Glucose is 123. Total bilirubin borderline elevated.   Hgb and hct are low but stable. MCV and MCH are mildly elevated.   Okay to refill per Dr. Estanislado Pandy.

## 2019-08-08 NOTE — Telephone Encounter (Signed)
Received fax from [facility] that states MFG that patient has been on previously is unavailable. Called pharmacy and okayed change in MFG that is available.

## 2019-08-09 DIAGNOSIS — Z08 Encounter for follow-up examination after completed treatment for malignant neoplasm: Secondary | ICD-10-CM | POA: Diagnosis not present

## 2019-08-09 DIAGNOSIS — L570 Actinic keratosis: Secondary | ICD-10-CM | POA: Diagnosis not present

## 2019-08-09 DIAGNOSIS — L820 Inflamed seborrheic keratosis: Secondary | ICD-10-CM | POA: Diagnosis not present

## 2019-08-09 DIAGNOSIS — Z85820 Personal history of malignant melanoma of skin: Secondary | ICD-10-CM | POA: Diagnosis not present

## 2019-08-09 DIAGNOSIS — X32XXXD Exposure to sunlight, subsequent encounter: Secondary | ICD-10-CM | POA: Diagnosis not present

## 2019-08-09 DIAGNOSIS — Z85828 Personal history of other malignant neoplasm of skin: Secondary | ICD-10-CM | POA: Diagnosis not present

## 2019-08-10 NOTE — Progress Notes (Signed)
Virtual Visit via Telephone Note  I connected with Bryan Kaczka Deininger Sr. on 08/12/19 at  9:40 AM EST by telephone and verified that I am speaking with the correct person using two identifiers.  Location: Patient: Home  Provider: Clinic  This service was conducted via virtual visit.  Both audio and visual tools were used.  The patient was located at home. I was located in my office.  Consent was obtained prior to the virtual visit and is aware of possible charges through their insurance for this visit.  The patient is an established patient.  Dr. Estanislado Pandy, MD conducted the virtual visit and Hazel Sams, PA-C acted as scribe during the service.  Office staff helped with scheduling follow up visits after the service was conducted.   I discussed the limitations, risks, security and privacy concerns of performing an evaluation and management service by telephone and the availability of in person appointments. I also discussed with the patient that there may be a patient responsible charge related to this service. The patient expressed understanding and agreed to proceed.  CC: Joint stiffness  History of Present Illness: Patient is 78 year old male with a history of seropositive rheumatoid arthritis.  He is taking methotrexate 6 tablets po once weekly and folic acid 2 mg po daily.  He has not had any recent flares.  She has been having increased joint stiffness since he has been more sedentary recently.  He has occasional arthralgias but no joint swelling.  He has been having increased mouth dryness and eye dryness but he is not using any otc products at this time.   Review of Systems  Constitutional: Positive for malaise/fatigue. Negative for fever.  HENT:       +Dry mouth  Eyes: Negative for photophobia, pain, discharge and redness.       +Dry eyes  Respiratory: Negative for cough, shortness of breath and wheezing.   Cardiovascular: Negative for chest pain and palpitations.  Gastrointestinal:  Negative for blood in stool, constipation and diarrhea.  Genitourinary: Negative for dysuria.  Musculoskeletal: Positive for joint pain. Negative for back pain, myalgias and neck pain.       +Joint stiffness   Skin: Negative for rash.  Neurological: Negative for dizziness and headaches.  Psychiatric/Behavioral: Positive for depression. The patient is not nervous/anxious and does not have insomnia.       Observations/Objective: Physical Exam  Constitutional: He is oriented to person, place, and time.  Neurological: He is alert and oriented to person, place, and time.  Psychiatric: Mood, memory, affect and judgment normal.   Patient reports morning stiffness for 10-15  minutes.   Patient reports nocturnal pain.  Difficulty dressing/grooming: Denies Difficulty climbing stairs: Denies Difficulty getting out of chair: Denies Difficulty using hands for taps, buttons, cutlery, and/or writing: Reports   Assessment and Plan: Visit Diagnoses: Rheumatoid arthritis involving multiple sites with positive rheumatoid factor (HCC) - Positive rheumatoid factor, positive anti-CCP, severe erosive disease with nodulosis and contractures: He has not had any recent rheumatoid arthritis flares. He has been having increased joint stiffness since he has been more sedentary recently.  He has occasional arthralgias but no joint swelling.  He is clinically doing well on Methotrexate 6 tablets by mouth once weekly and folic acid 2 mg po daily.  He will continue on this current treatment regimen.  He does not need any refills at this time.  He was advised to notify us if he develops increased joint pain or joint swelling.  He will follow up in 4-5 months.  High risk medication use -Methotrexate 2.5 mg 6 tablets every 7 days and folic acid 1 mg 2 tablets daily. CBC and CMP were drawn on 05/10/19.  He is due to update lab work.  Standing orders are in place.      History of total knee replacement, bilateral - Doing  well.  He has no discomfort at this time.    Chronic pain syndrome -He takes Tylenol #3 for pain relief.   Other medical conditions are listed as follows:   Prediabetes   History of melanoma - He has been followed by dermatology  History of gastroesophageal reflux (GERD)   History of basal cell carcinoma   History of hyperlipidemia  History of anemia    Follow Up Instructions: He will follow up in 4-5 months.    I discussed the assessment and treatment plan with the patient. The patient was provided an opportunity to ask questions and all were answered. The patient agreed with the plan and demonstrated an understanding of the instructions.   The patient was advised to call back or seek an in-person evaluation if the symptoms worsen or if the condition fails to improve as anticipated.  I provided 15 minutes of non-face-to-face time during this encounter.   Bo Merino, MD  Scribed by-  Hazel Sams, PA-C

## 2019-08-12 ENCOUNTER — Other Ambulatory Visit: Payer: Self-pay

## 2019-08-12 ENCOUNTER — Telehealth (INDEPENDENT_AMBULATORY_CARE_PROVIDER_SITE_OTHER): Payer: PPO | Admitting: Rheumatology

## 2019-08-12 ENCOUNTER — Encounter: Payer: Self-pay | Admitting: Rheumatology

## 2019-08-12 DIAGNOSIS — G894 Chronic pain syndrome: Secondary | ICD-10-CM | POA: Diagnosis not present

## 2019-08-12 DIAGNOSIS — Z85828 Personal history of other malignant neoplasm of skin: Secondary | ICD-10-CM

## 2019-08-12 DIAGNOSIS — Z8639 Personal history of other endocrine, nutritional and metabolic disease: Secondary | ICD-10-CM | POA: Diagnosis not present

## 2019-08-12 DIAGNOSIS — Z79899 Other long term (current) drug therapy: Secondary | ICD-10-CM

## 2019-08-12 DIAGNOSIS — R7303 Prediabetes: Secondary | ICD-10-CM | POA: Diagnosis not present

## 2019-08-12 DIAGNOSIS — Z96653 Presence of artificial knee joint, bilateral: Secondary | ICD-10-CM | POA: Diagnosis not present

## 2019-08-12 DIAGNOSIS — Z8719 Personal history of other diseases of the digestive system: Secondary | ICD-10-CM

## 2019-08-12 DIAGNOSIS — Z862 Personal history of diseases of the blood and blood-forming organs and certain disorders involving the immune mechanism: Secondary | ICD-10-CM

## 2019-08-12 DIAGNOSIS — M0579 Rheumatoid arthritis with rheumatoid factor of multiple sites without organ or systems involvement: Secondary | ICD-10-CM | POA: Diagnosis not present

## 2019-08-12 DIAGNOSIS — Z85820 Personal history of malignant melanoma of skin: Secondary | ICD-10-CM

## 2019-08-13 LAB — CMP14+EGFR
ALT: 14 IU/L (ref 0–44)
AST: 20 IU/L (ref 0–40)
Albumin/Globulin Ratio: 1.9 (ref 1.2–2.2)
Albumin: 4.3 g/dL (ref 3.7–4.7)
Alkaline Phosphatase: 75 IU/L (ref 39–117)
BUN/Creatinine Ratio: 26 — ABNORMAL HIGH (ref 10–24)
BUN: 23 mg/dL (ref 8–27)
Bilirubin Total: 1 mg/dL (ref 0.0–1.2)
CO2: 22 mmol/L (ref 20–29)
Calcium: 9.3 mg/dL (ref 8.6–10.2)
Chloride: 103 mmol/L (ref 96–106)
Creatinine, Ser: 0.87 mg/dL (ref 0.76–1.27)
GFR calc Af Amer: 95 mL/min/{1.73_m2} (ref >59)
GFR calc non Af Amer: 82 mL/min/{1.73_m2} (ref >59)
Globulin, Total: 2.3 g/dL (ref 1.5–4.5)
Glucose: 114 mg/dL — ABNORMAL HIGH (ref 65–99)
Potassium: 4.2 mmol/L (ref 3.5–5.2)
Sodium: 141 mmol/L (ref 134–144)
Total Protein: 6.6 g/dL (ref 6.0–8.5)

## 2019-08-13 LAB — CBC WITH DIFFERENTIAL/PLATELET
Basophils Absolute: 0 10*3/uL (ref 0.0–0.2)
Basos: 1 %
EOS (ABSOLUTE): 0.1 10*3/uL (ref 0.0–0.4)
Eos: 2 %
Hematocrit: 34.5 % — ABNORMAL LOW (ref 37.5–51.0)
Hemoglobin: 12 g/dL — ABNORMAL LOW (ref 13.0–17.7)
Immature Grans (Abs): 0 10*3/uL (ref 0.0–0.1)
Immature Granulocytes: 0 %
Lymphocytes Absolute: 3.9 10*3/uL — ABNORMAL HIGH (ref 0.7–3.1)
Lymphs: 52 %
MCH: 34.2 pg — ABNORMAL HIGH (ref 26.6–33.0)
MCHC: 34.8 g/dL (ref 31.5–35.7)
MCV: 98 fL — ABNORMAL HIGH (ref 79–97)
Monocytes Absolute: 0.5 10*3/uL (ref 0.1–0.9)
Monocytes: 7 %
Neutrophils Absolute: 2.8 10*3/uL (ref 1.4–7.0)
Neutrophils: 38 %
Platelets: 179 10*3/uL (ref 150–450)
RBC: 3.51 x10E6/uL — ABNORMAL LOW (ref 4.14–5.80)
RDW: 13.3 % (ref 11.6–15.4)
WBC: 7.3 10*3/uL (ref 3.4–10.8)

## 2019-08-15 NOTE — Progress Notes (Signed)
Labs are stable.

## 2019-08-19 IMAGING — DX DG FOOT COMPLETE 3+V*R*
3 series · 3 of 3 positions shown · non-contrast
Comparison: 08/21/2011

CLINICAL DATA: Fell 1 week ago.  Pain and swelling.

EXAM:
RIGHT FOOT COMPLETE - 3+ VIEW

[foot ap]
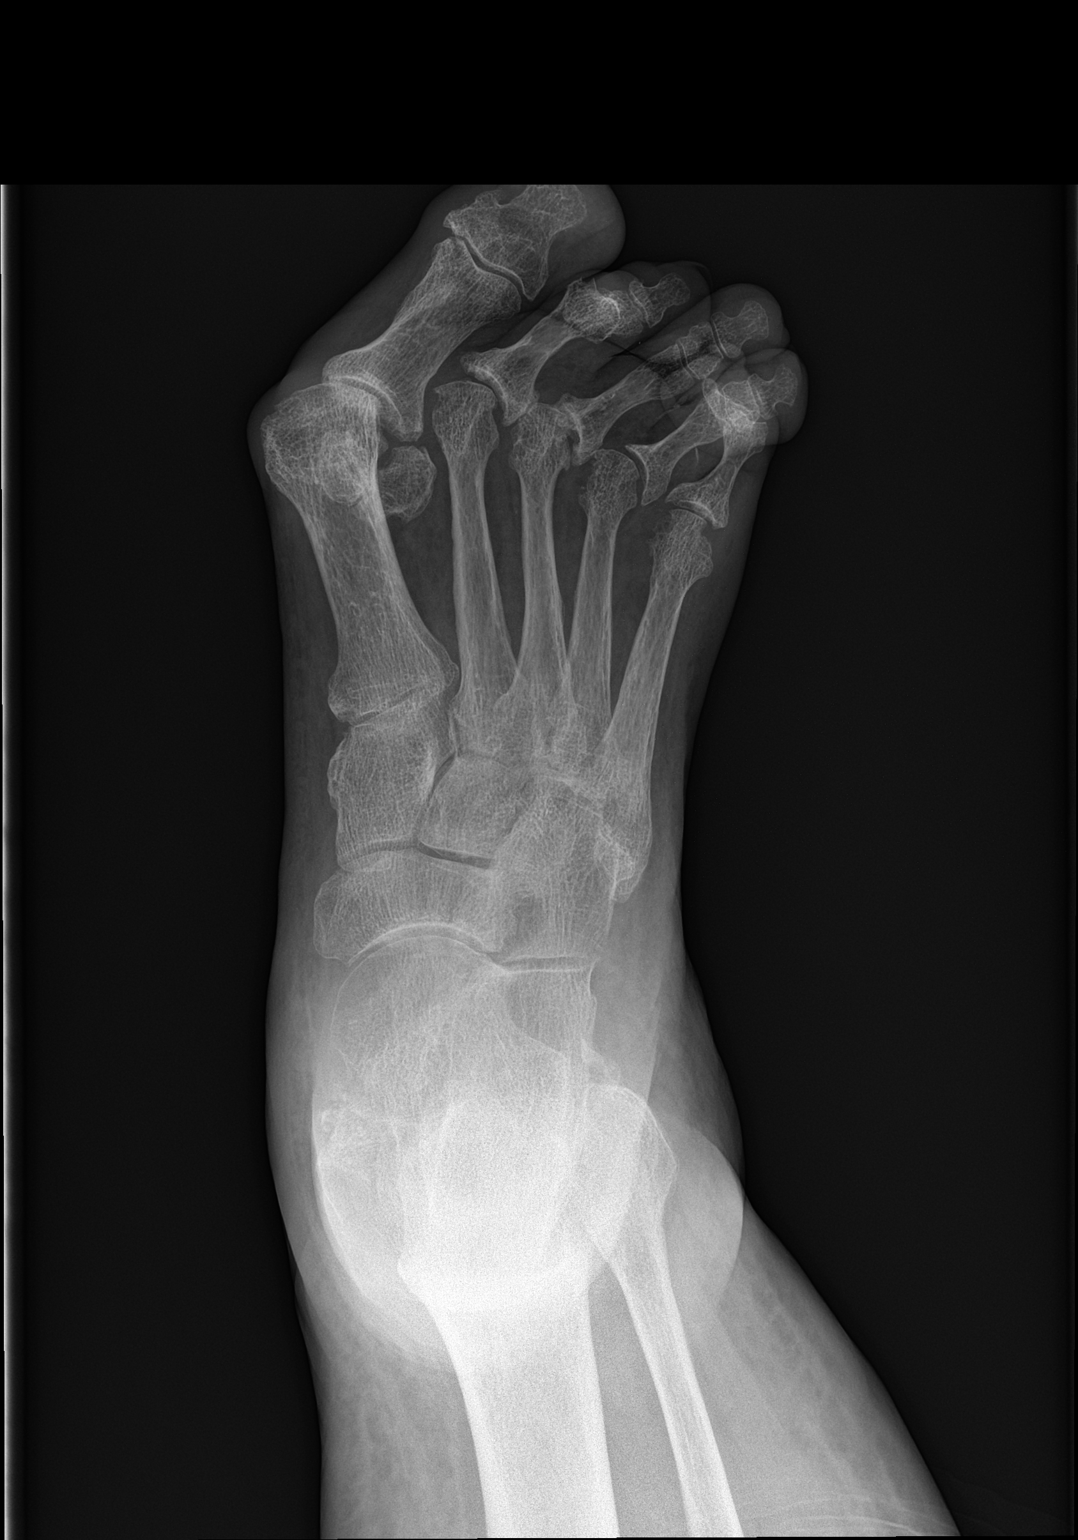

[foot obl]
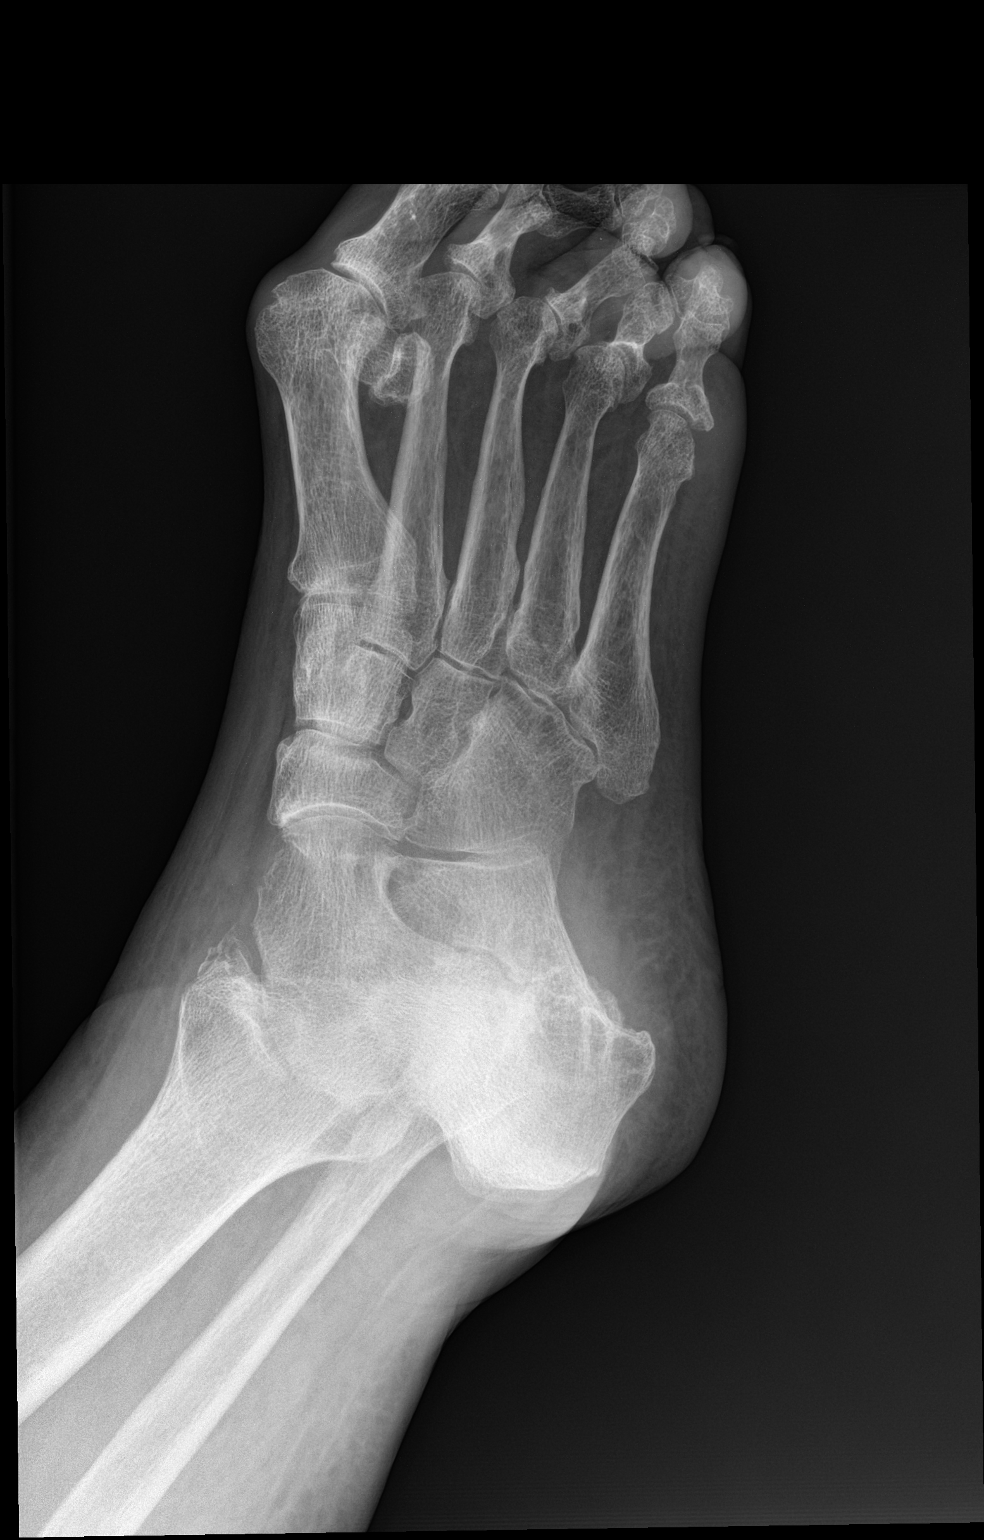

[foot lat]
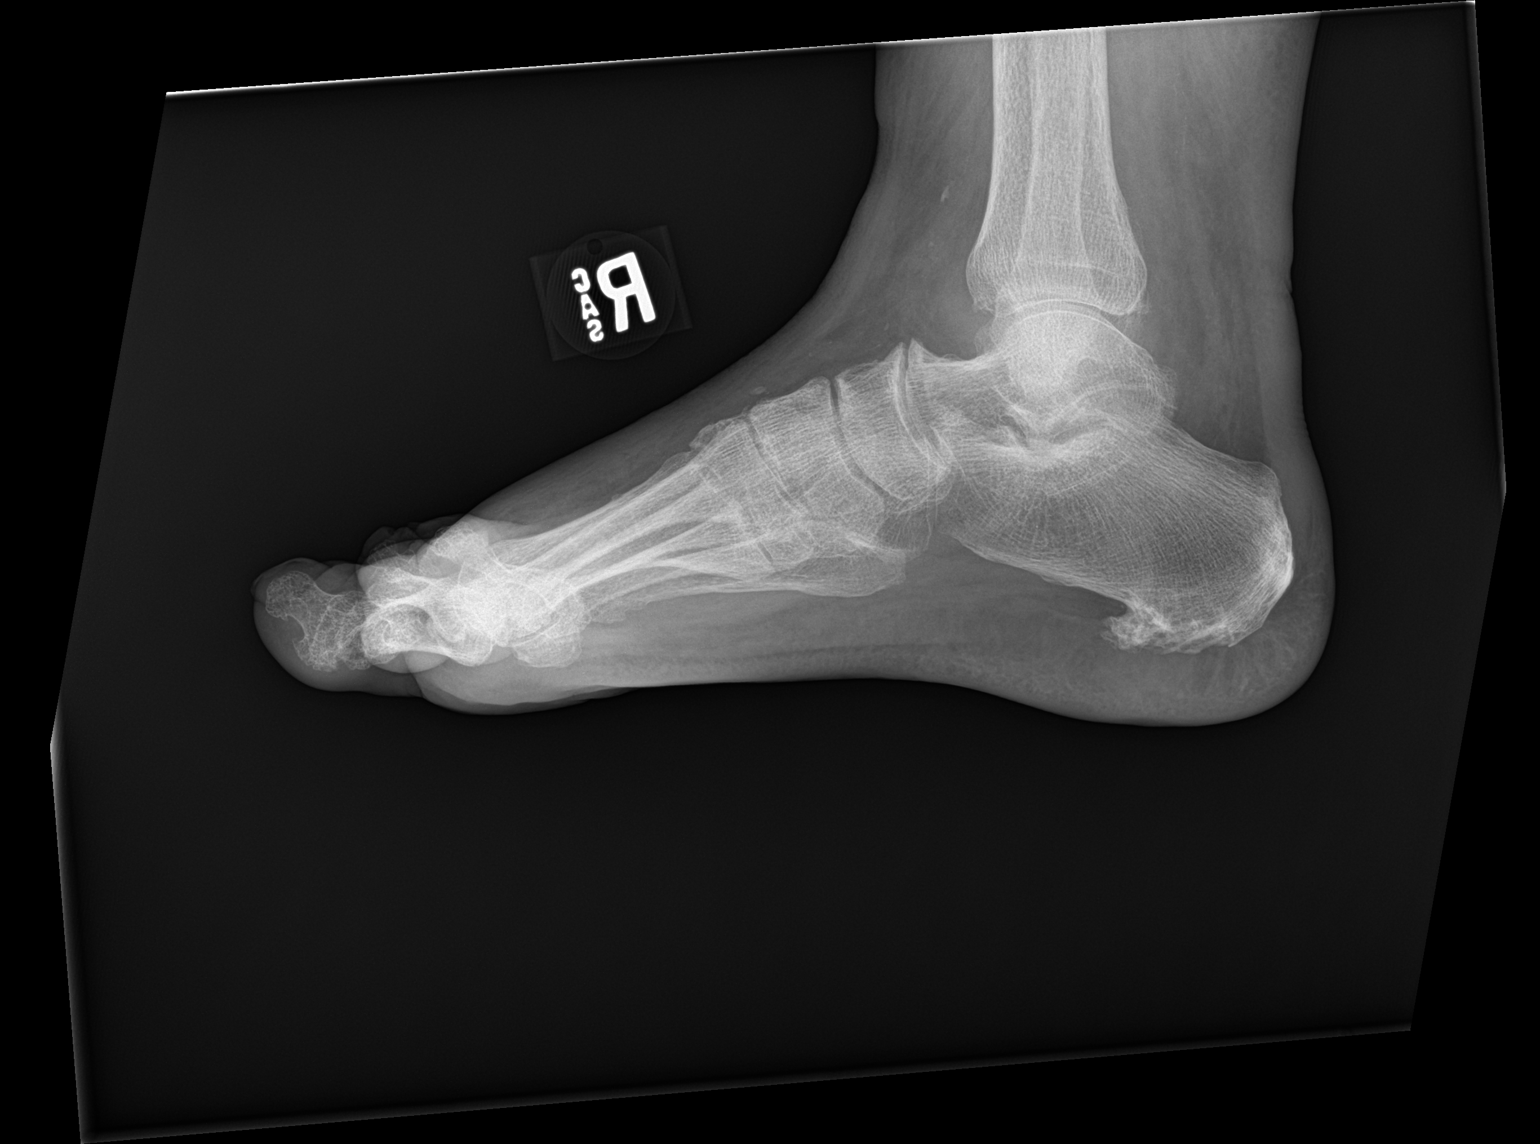

[3 of 3 positions shown; findings below may reference images not displayed]

FINDINGS: No sign of fracture or dislocation. Chronic changes of rheumatoid
arthritis with subluxation and erosions at the metatarsal phalangeal
joints. Mild osteoarthritis of the midfoot. Plantar calcaneal spur.
Small ankle joint effusion.
IMPRESSION: No traumatic finding in the foot. Changes of rheumatoid arthritis
with subluxations and erosive arthritis at the MTP joints.

## 2019-08-19 IMAGING — DX DG ANKLE COMPLETE 3+V*R*
3 series · 3 of 3 positions shown · non-contrast
Comparison: None.

CLINICAL DATA: Fell 1 week ago.  Pain and swelling.

EXAM:
RIGHT ANKLE - COMPLETE 3+ VIEW

[ankle ap]
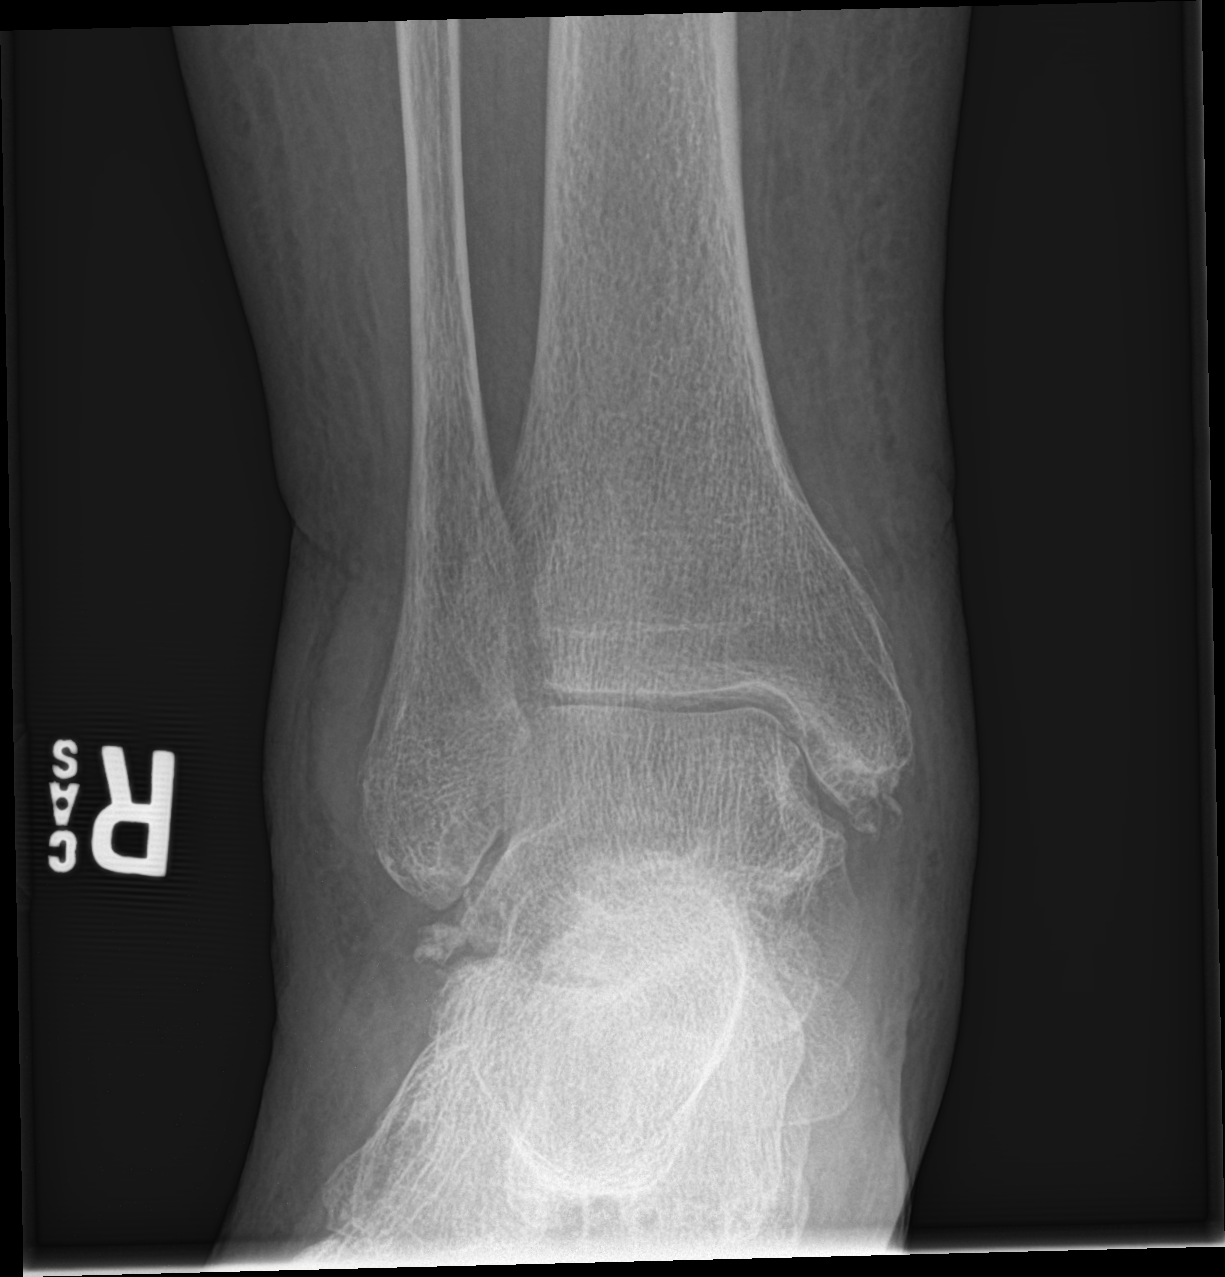

[ankle lat]
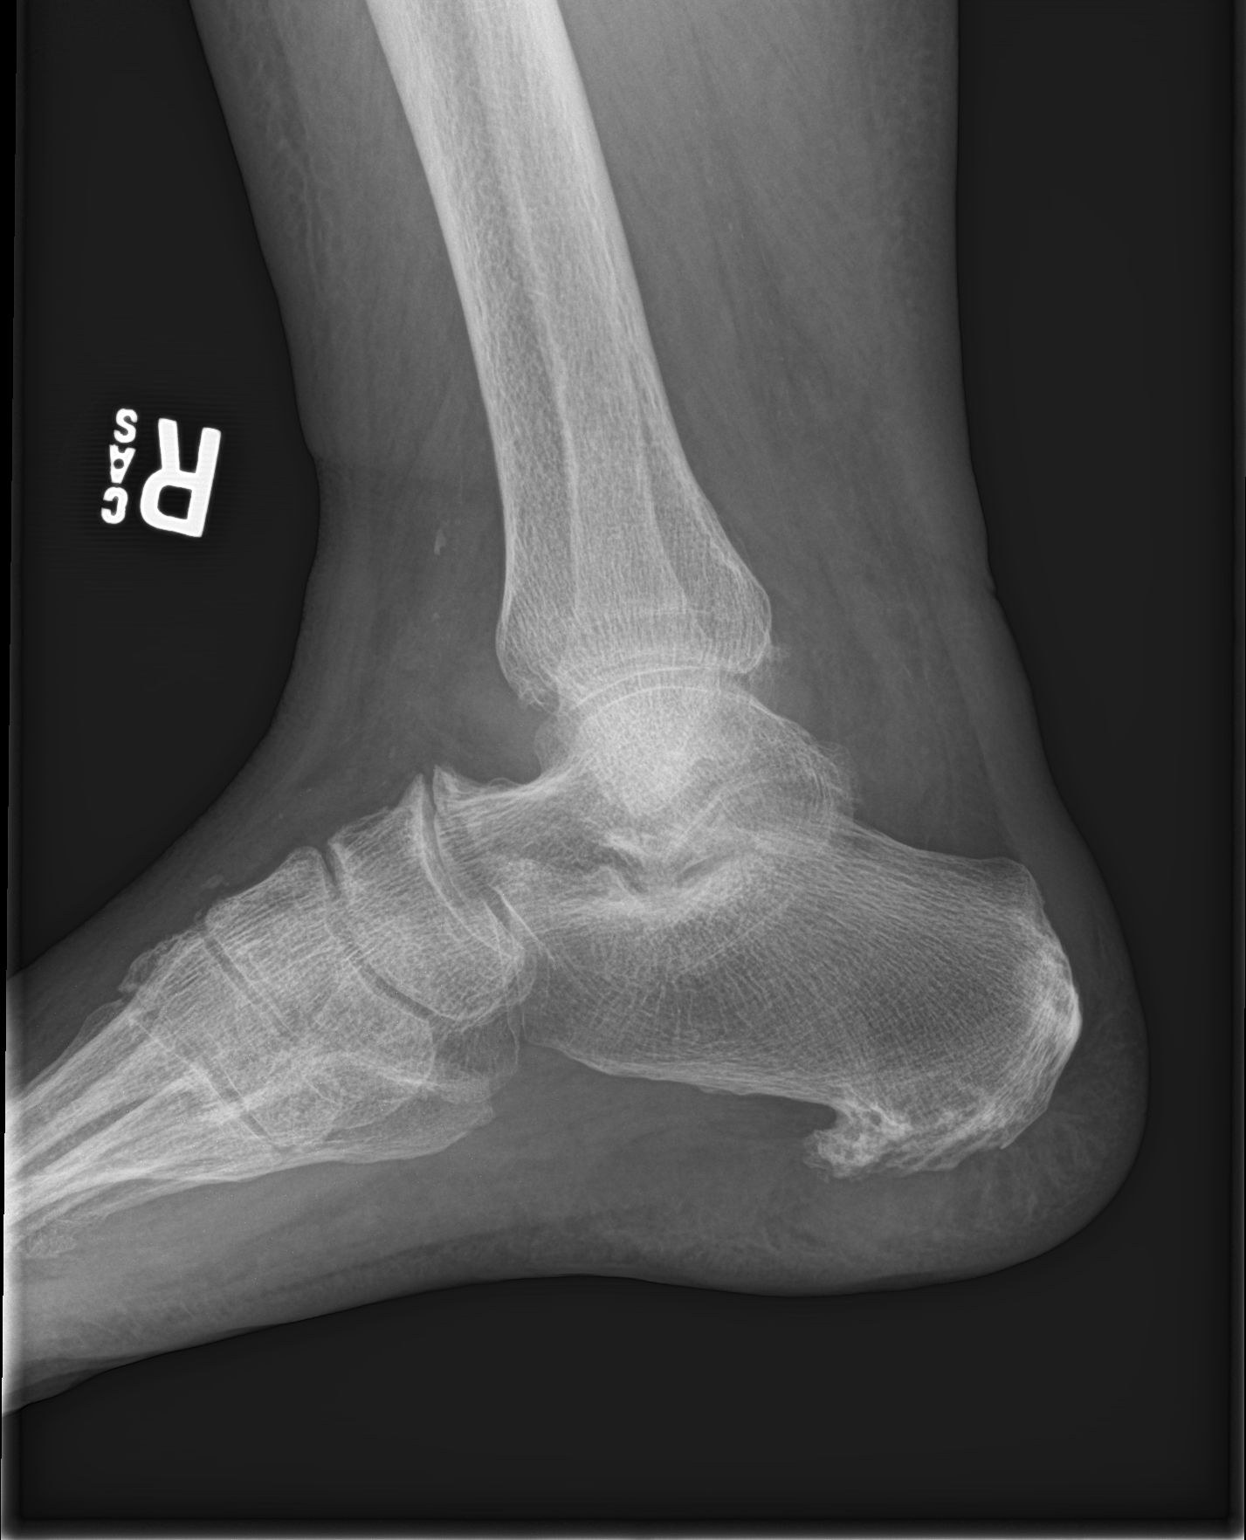

[ankle obl]
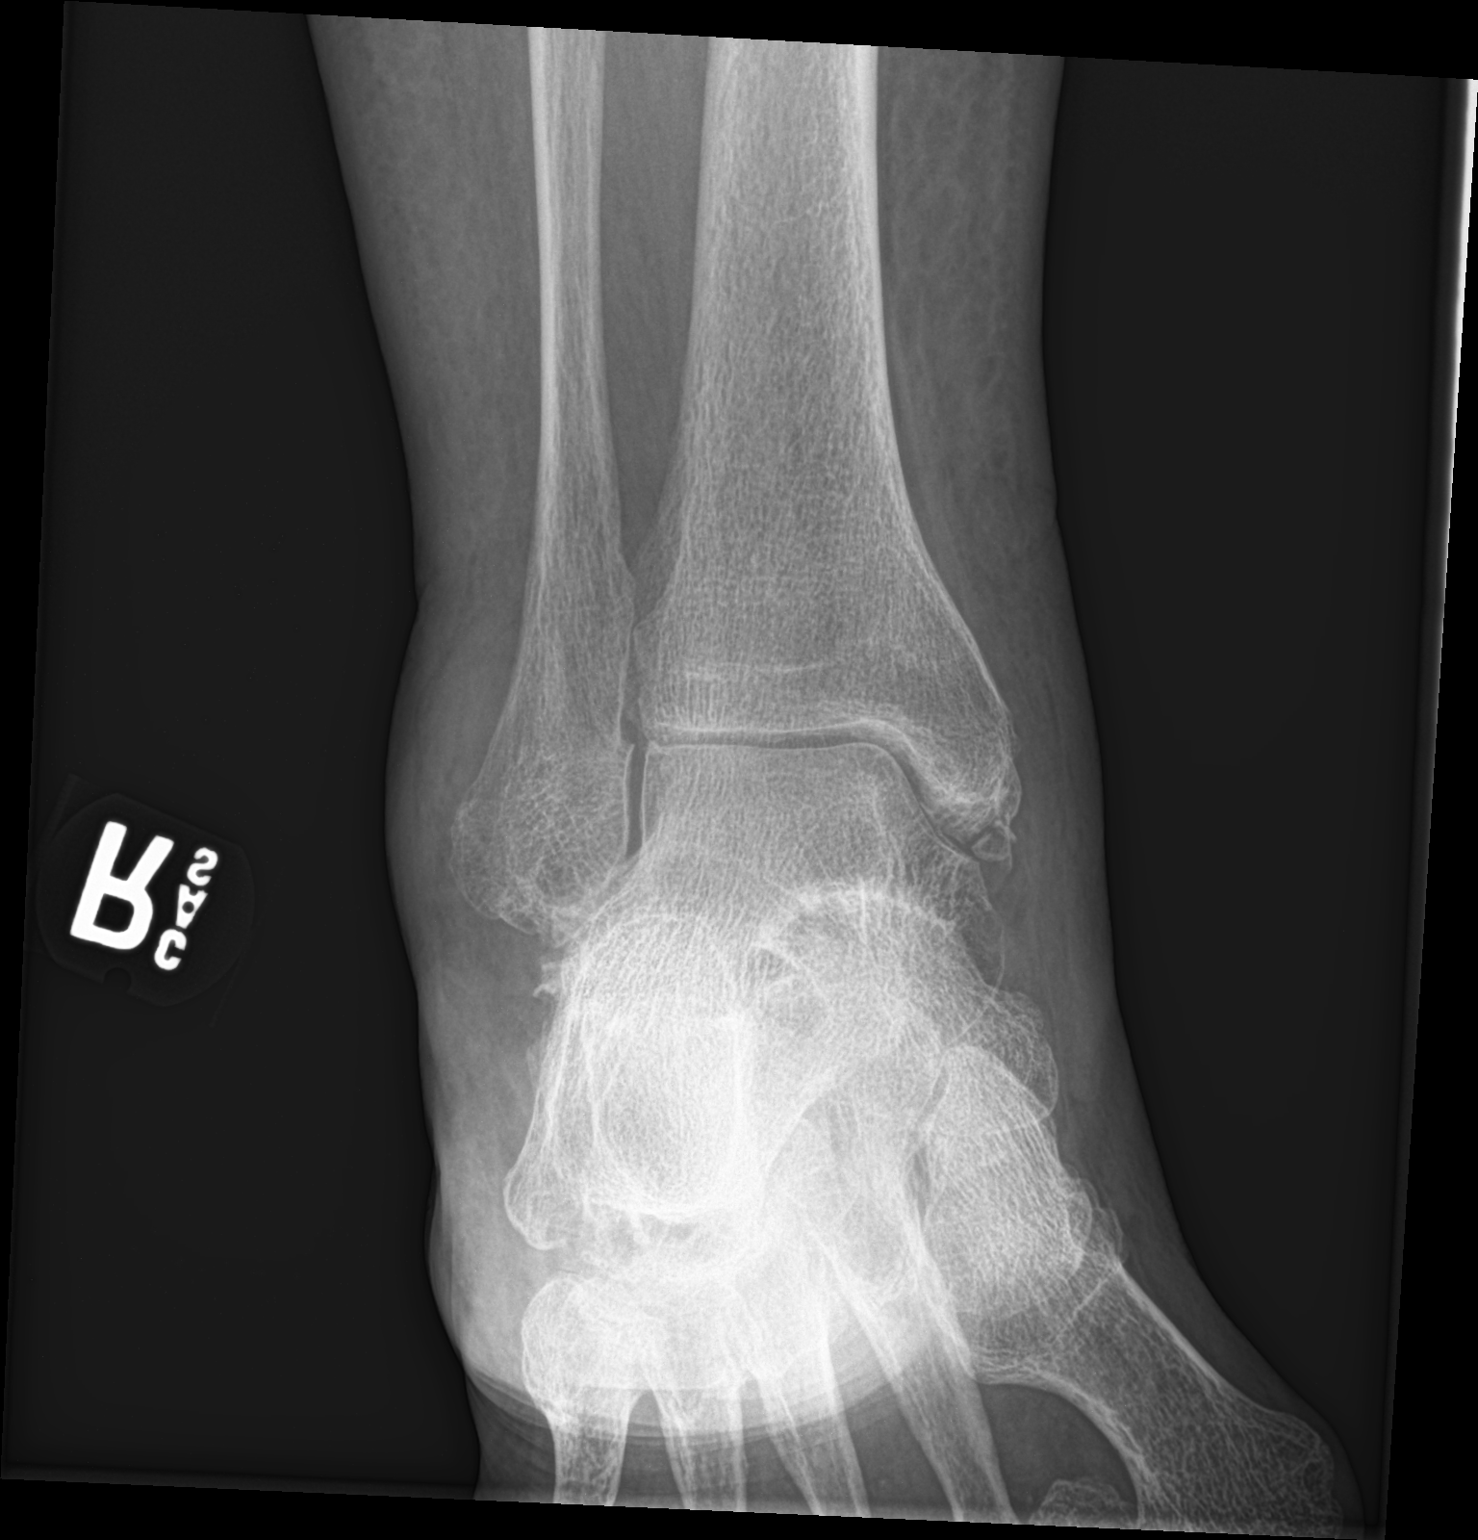

[3 of 3 positions shown; findings below may reference images not displayed]

FINDINGS: Regional soft tissue swelling. Joint effusion. Mild joint space
narrowing. No visible erosions. Evidence of old ligamentous injuries
medial and lateral.
IMPRESSION: Joint effusion. No acute traumatic finding. Evidence of old medial
and lateral ligamentous injuries. Mild joint space narrowing. No
visible erosions.

## 2019-09-01 ENCOUNTER — Other Ambulatory Visit: Payer: Self-pay | Admitting: Family Medicine

## 2019-09-06 ENCOUNTER — Ambulatory Visit (INDEPENDENT_AMBULATORY_CARE_PROVIDER_SITE_OTHER): Payer: PPO | Admitting: Family Medicine

## 2019-09-06 ENCOUNTER — Encounter: Payer: Self-pay | Admitting: Family Medicine

## 2019-09-06 ENCOUNTER — Other Ambulatory Visit: Payer: Self-pay

## 2019-09-06 DIAGNOSIS — G894 Chronic pain syndrome: Secondary | ICD-10-CM | POA: Diagnosis not present

## 2019-09-06 DIAGNOSIS — E785 Hyperlipidemia, unspecified: Secondary | ICD-10-CM

## 2019-09-06 DIAGNOSIS — M0579 Rheumatoid arthritis with rheumatoid factor of multiple sites without organ or systems involvement: Secondary | ICD-10-CM | POA: Diagnosis not present

## 2019-09-06 MED ORDER — ACETAMINOPHEN-CODEINE #3 300-30 MG PO TABS: ORAL_TABLET | ORAL | 0 refills | Status: DC

## 2019-09-06 MED ORDER — FLUOXETINE HCL 10 MG PO CAPS: 10.0000 mg | ORAL_CAPSULE | Freq: Every day | ORAL | 4 refills | Status: DC

## 2019-09-06 MED ORDER — ATORVASTATIN CALCIUM 10 MG PO TABS: 10.0000 mg | ORAL_TABLET | Freq: Every day | ORAL | 1 refills | Status: DC

## 2019-09-06 NOTE — Progress Notes (Signed)
Subjective:    Patient ID: Bryan Homans Sr., male    DOB: 10/02/1940, 79 y.o.   MRN: [id number]  Hyperlipidemia This is a chronic problem. The current episode started more than 1 year ago. Pertinent negatives include no chest pain or shortness of breath. Treatments tried: lipitor. There are no compliance problems.  Risk factors for coronary artery disease include dyslipidemia and hypertension.   Patient states he has had a lot going on this year with all the covid stuff- son in nursing home had covid- wife in hospital and has been a little down at times. All stuff on TV about dying.  Virtual Visit via Video Note  I connected with Bryan Mans Myron Sr. on 09/06/19 at 10:00 AM EST by a video enabled telemedicine application and verified that I am speaking with the correct person using two identifiers.  Location: Patient: home Provider: office   I discussed the limitations of evaluation and management by telemedicine and the availability of in person appointments. The patient expressed understanding and agreed to proceed.  History of Present Illness:    Observations/Objective:   Assessment and Plan:   Follow Up Instructions:    I discussed the assessment and treatment plan with the patient. The patient was provided an opportunity to ask questions and all were answered. The patient agreed with the plan and demonstrated an understanding of the instructions.   The patient was advised to call back or seek an in-person evaluation if the symptoms worsen or if the condition fails to improve as anticipated.  I provided 20 minutes of non-face-to-face time during this encounter.  Marcello Moores Fall Risk  09/06/2019 03/31/2018 02/18/2016 08/15/2015 03/20/2014  Falls in the past year? 0 No No No No  Follow up Falls evaluation completed - - - -       Review of Systems  Constitutional: Negative for activity change.  HENT: Negative for congestion and rhinorrhea.   Respiratory: Negative for cough  and shortness of breath.   Cardiovascular: Negative for chest pain.  Gastrointestinal: Negative for abdominal pain, diarrhea, nausea and vomiting.  Genitourinary: Negative for dysuria and hematuria.  Musculoskeletal: Positive for arthralgias and back pain.  Neurological: Negative for weakness and headaches.  Psychiatric/Behavioral: Negative for behavioral problems and confusion.   Patient denies being suicidal    Objective:   Physical Exam  Today's visit was via telephone Physical exam was not possible for this visit   25 minutes was spent with the patient.  This statement verifies that 25 minutes was indeed spent with the patient.  More than 50% of this visit-total duration of the visit-was spent in counseling and coordination of care. The issues that the patient came in for today as reflected in the diagnosis (s) please refer to documentation for further details.     Assessment & Plan:  1. Chronic pain syndrome Chronic pain related to his arthritis may use his pain medicine he uses it sparingly drug registry checked.  2 prescriptions were sent in.  2. Hyperlipidemia, unspecified hyperlipidemia type Hyperlipidemia continue current medication No need for lab work currently 3. Rheumatoid arthritis involving multiple sites with positive rheumatoid factor ([place]) Followed by specialist for this uses pain medicine sparingly Covid vaccine recommended when available Patient also relates feeling depressed between having some friends who got Covid plus also the stress of being sick and getting older he states he does not have much enjoyment in things but does not want to be on any type of high-dose medicine but  is willing to go on a low-dose medicine we did discuss Prozac if it does cause him feel worse he will let us know otherwise we will do a follow-up in approximately 4 weeks

## 2019-09-08 ENCOUNTER — Telehealth: Payer: Self-pay | Admitting: Family Medicine

## 2019-09-08 DIAGNOSIS — H579 Unspecified disorder of eye and adnexa: Secondary | ICD-10-CM

## 2019-09-08 NOTE — Telephone Encounter (Signed)
Referral put in.

## 2019-09-08 NOTE — Telephone Encounter (Signed)
Please advise. Thank you

## 2019-09-08 NOTE — Telephone Encounter (Signed)
Pt would like referral to Dr. Marisa Hua, [facility] in [place] across from [place].   [phone] ext [phone]  Fax number [phone] (on card pt sent in. Card shows [place] but doctor comes to [place].)

## 2019-09-08 NOTE — Telephone Encounter (Signed)
May have referral 

## 2019-09-08 NOTE — Telephone Encounter (Signed)
Pt wants to know if he should continue to take FLUoxetine (PROZAC) 10 MG capsule. He has taken two already but the side effects can cause him to go blind and have eye redness.

## 2019-09-09 NOTE — Telephone Encounter (Signed)
Pt walked into office to inquire about message. Pt does not feel like he is going blind, pt does need new eye glasses. Pt  states he has cataracts. Pt states he read the side effects and they stated that the Prozac could cause nose bleeds, jaundice, vision issues and much more.  Pt read this information on the information leaflet that come with med. Pt informed that provider states in his opinion it is safe to take. Pt verbalized understanding

## 2019-09-09 NOTE — Telephone Encounter (Signed)
Nurses Is the patient states that he took 2 capsules and he feels like he is going blind?  Or did he read that he could go blind?  And if so where did he read this?  (In my 30 years of practice I have never read this or seen an example of this medication causing blindness.  I even reviewed over the FDA information today and did not see that.  So in my opinion it is safe to take but I would be interested in what is causing him to say what he said in his message)

## 2019-09-20 DIAGNOSIS — L570 Actinic keratosis: Secondary | ICD-10-CM | POA: Diagnosis not present

## 2019-09-20 DIAGNOSIS — C44229 Squamous cell carcinoma of skin of left ear and external auricular canal: Secondary | ICD-10-CM | POA: Diagnosis not present

## 2019-09-20 DIAGNOSIS — X32XXXD Exposure to sunlight, subsequent encounter: Secondary | ICD-10-CM | POA: Diagnosis not present

## 2019-10-07 ENCOUNTER — Telehealth: Payer: Self-pay | Admitting: Family Medicine

## 2019-10-07 DIAGNOSIS — E785 Hyperlipidemia, unspecified: Secondary | ICD-10-CM

## 2019-10-07 DIAGNOSIS — D539 Nutritional anemia, unspecified: Secondary | ICD-10-CM

## 2019-10-07 DIAGNOSIS — E569 Vitamin deficiency, unspecified: Secondary | ICD-10-CM

## 2019-10-07 DIAGNOSIS — Z79899 Other long term (current) drug therapy: Secondary | ICD-10-CM

## 2019-10-07 NOTE — Telephone Encounter (Signed)
Lab orders placed and pt is aware 

## 2019-10-07 NOTE — Telephone Encounter (Signed)
Lipid, liver, metabolic 7, vitamin [id number], hemoglobin hematocrit Macrocytic anemia, hyperlipidemia, diuretic use

## 2019-10-07 NOTE — Telephone Encounter (Signed)
Last labs entered on 09/20/2018 are still active. Hepatic and lipid. Please advise. Thank you

## 2019-10-07 NOTE — Telephone Encounter (Signed)
Patient has physical on 2/15 and requesting labs

## 2019-10-10 DIAGNOSIS — E569 Vitamin deficiency, unspecified: Secondary | ICD-10-CM | POA: Diagnosis not present

## 2019-10-10 DIAGNOSIS — D539 Nutritional anemia, unspecified: Secondary | ICD-10-CM | POA: Diagnosis not present

## 2019-10-10 DIAGNOSIS — Z79899 Other long term (current) drug therapy: Secondary | ICD-10-CM | POA: Diagnosis not present

## 2019-10-10 DIAGNOSIS — E785 Hyperlipidemia, unspecified: Secondary | ICD-10-CM | POA: Diagnosis not present

## 2019-10-11 LAB — BASIC METABOLIC PANEL
BUN/Creatinine Ratio: 15 (ref 10–24)
BUN: 13 mg/dL (ref 8–27)
CO2: 24 mmol/L (ref 20–29)
Calcium: 9.4 mg/dL (ref 8.6–10.2)
Chloride: 104 mmol/L (ref 96–106)
Creatinine, Ser: 0.86 mg/dL (ref 0.76–1.27)
GFR calc Af Amer: 95 mL/min/{1.73_m2} (ref >59)
GFR calc non Af Amer: 82 mL/min/{1.73_m2} (ref >59)
Glucose: 116 mg/dL — ABNORMAL HIGH (ref 65–99)
Potassium: 4.6 mmol/L (ref 3.5–5.2)
Sodium: 142 mmol/L (ref 134–144)

## 2019-10-11 LAB — HEMOGLOBIN AND HEMATOCRIT, BLOOD
Hematocrit: 37.1 % — ABNORMAL LOW (ref 37.5–51.0)
Hemoglobin: 12.4 g/dL — ABNORMAL LOW (ref 13.0–17.7)

## 2019-10-11 LAB — HEPATIC FUNCTION PANEL
ALT: 13 IU/L (ref 0–44)
AST: 20 IU/L (ref 0–40)
Albumin: 4.5 g/dL (ref 3.7–4.7)
Alkaline Phosphatase: 76 IU/L (ref 39–117)
Bilirubin Total: 1.1 mg/dL (ref 0.0–1.2)
Bilirubin, Direct: 0.25 mg/dL (ref 0.00–0.40)
Total Protein: 6.7 g/dL (ref 6.0–8.5)

## 2019-10-11 LAB — LIPID PANEL
Chol/HDL Ratio: 2.4 ratio (ref 0.0–5.0)
Cholesterol, Total: 138 mg/dL (ref 100–199)
HDL: 58 mg/dL (ref >39)
LDL Chol Calc (NIH): 61 mg/dL (ref 0–99)
Triglycerides: 103 mg/dL (ref 0–149)
VLDL Cholesterol Cal: 19 mg/dL (ref 5–40)

## 2019-10-11 LAB — VITAMIN B12: Vitamin B-12: 610 pg/mL (ref 232–1245)

## 2019-10-17 ENCOUNTER — Ambulatory Visit (INDEPENDENT_AMBULATORY_CARE_PROVIDER_SITE_OTHER): Payer: PPO | Admitting: Family Medicine

## 2019-10-17 ENCOUNTER — Other Ambulatory Visit: Payer: Self-pay

## 2019-10-17 VITALS — BP 132/84 | Temp 97.9°F | Ht 67.0 in | Wt 181.6 lb

## 2019-10-17 DIAGNOSIS — R7303 Prediabetes: Secondary | ICD-10-CM

## 2019-10-17 DIAGNOSIS — Z0000 Encounter for general adult medical examination without abnormal findings: Secondary | ICD-10-CM

## 2019-10-17 DIAGNOSIS — G894 Chronic pain syndrome: Secondary | ICD-10-CM

## 2019-10-17 MED ORDER — ACETAMINOPHEN-CODEINE #3 300-30 MG PO TABS: 1.0000 | ORAL_TABLET | Freq: Two times a day (BID) | ORAL | 0 refills | Status: DC | PRN

## 2019-10-17 NOTE — Progress Notes (Signed)
Subjective:    Patient ID: Bryan Homans Sr., male    DOB: [birth year], 79 y.o.   MRN: [id number]  HPI  The patient comes in today for a wellness visit.  Results for orders placed or performed in visit on 10/07/19  Lipid Profile  Result Value Ref Range   Cholesterol, Total 138 100 - 199 mg/dL   Triglycerides 103 0 - 149 mg/dL   HDL 58 >39 mg/dL   VLDL Cholesterol Cal 19 5 - 40 mg/dL   LDL Chol Calc (NIH) 61 0 - 99 mg/dL   Chol/HDL Ratio 2.4 0.0 - 5.0 ratio  Hepatic function panel  Result Value Ref Range   Total Protein 6.7 6.0 - 8.5 g/dL   Albumin 4.5 3.7 - 4.7 g/dL   Bilirubin Total 1.1 0.0 - 1.2 mg/dL   Bilirubin, Direct 0.25 0.00 - 0.40 mg/dL   Alkaline Phosphatase 76 39 - 117 IU/L   AST 20 0 - 40 IU/L   ALT 13 0 - 44 IU/L  Basic Metabolic Panel (BMET)  Result Value Ref Range   Glucose 116 (H) 65 - 99 mg/dL   BUN 13 8 - 27 mg/dL   Creatinine, Ser 0.86 0.76 - 1.27 mg/dL   GFR calc non Af Amer 82 >59 mL/min/1.73   GFR calc Af Amer 95 >59 mL/min/1.73   BUN/Creatinine Ratio 15 10 - 24   Sodium 142 134 - 144 mmol/L   Potassium 4.6 3.5 - 5.2 mmol/L   Chloride 104 96 - 106 mmol/L   CO2 24 20 - 29 mmol/L   Calcium 9.4 8.6 - 10.2 mg/dL  B12  Result Value Ref Range   Vitamin B-12 610 232 - 1,245 pg/mL  Hemoglobin and Hematocrit, Blood  Result Value Ref Range   Hemoglobin 12.4 (L) 13.0 - 17.7 g/dL   Hematocrit 37.1 (L) 37.5 - 51.0 %     A review of their health history was completed.  A review of medications was also completed.  Any needed refills; no  Eating habits: 3 meal a day- eats good  Falls/  MVA accidents in past few months: none  Regular exercise: none since covid  Specialist pt sees on regular basis: urologist and rheumatologist  Preventative health issues were discussed.   Additional concerns: none This patient was seen today for chronic pain  The medication list was reviewed and updated.   -Compliance with medication: Overall decent  compliance with medicine  - Number patient states they take daily: Takes 1 or 2 daily  -when was the last dose patient took?  Earlier this week  The patient was advised the importance of maintaining medication and not using illegal substances with these.  Here for refills and follow up  The patient was educated that we can provide 3 monthly scripts for their medication, it is their responsibility to follow the instructions.  Side effects or complications from medications: Denies side effects from medicine  Patient is aware that pain medications are meant to minimize the severity of the pain to allow their pain levels to improve to allow for better function. They are aware of that pain medications cannot totally remove their pain.  Due for UDT ( at least once per year) : Later this year  Cognitive passes     Review of Systems  Constitutional: Negative for activity change, appetite change and fever.  HENT: Negative for congestion and rhinorrhea.   Eyes: Negative for discharge.  Respiratory: Negative for cough and wheezing.  Cardiovascular: Negative for chest pain.  Gastrointestinal: Negative for abdominal pain, blood in stool and vomiting.  Genitourinary: Negative for difficulty urinating and frequency.  Musculoskeletal: Positive for arthralgias and back pain. Negative for neck pain.  Skin: Negative for rash.  Allergic/Immunologic: Negative for environmental allergies and food allergies.  Neurological: Negative for weakness and headaches.  Psychiatric/Behavioral: Negative for agitation.   Fall Risk  10/17/2019 09/06/2019 03/31/2018 02/18/2016 08/15/2015  Falls in the past year? 0 0 No No No  Follow up Falls evaluation completed Falls evaluation completed - - -       Objective:   Physical Exam Vitals reviewed.  Constitutional:      General: He is not in acute distress. HENT:     Head: Normocephalic and atraumatic.  Eyes:     General:        Right eye: No discharge.         Left eye: No discharge.  Neck:     Trachea: No tracheal deviation.  Cardiovascular:     Rate and Rhythm: Normal rate and regular rhythm.     Heart sounds: Normal heart sounds. No murmur.  Pulmonary:     Effort: Pulmonary effort is normal. No respiratory distress.     Breath sounds: Normal breath sounds.  Lymphadenopathy:     Cervical: No cervical adenopathy.  Skin:    General: Skin is warm and dry.  Neurological:     Mental Status: He is alert.     Coordination: Coordination normal.  Psychiatric:        Behavior: Behavior normal.           Assessment & Plan:  Adult wellness-complete.wellness physical was conducted today. Importance of diet and exercise were discussed in detail.  In addition to this a discussion regarding safety was also covered. We also reviewed over immunizations and gave recommendations regarding current immunization needed for age.  In addition to this additional areas were also touched on including: Preventative health exams needed:  Colonoscopy not indicated  Patient was advised yearly wellness exam  The patient was seen in followup for chronic pain. A review over at their current pain status was discussed. Drug registry was checked. Prescriptions were given. Discussion was held regarding the importance of compliance with medication as well as pain medication contract.  Time for questions regarding pain management plan occurred. Importance of regular followup visits was discussed. Patient was informed that medication may cause drowsiness and should not be combined  with other medications/alcohol or street drugs. Patient was cautioned that medication could cause drowsiness. If the patient feels medication is causing altered alertness then do not drive or operate dangerous equipment.  We received did his pain medicine to allow him to do twice per day 45 tablets/month of Tylenol 3  We will send a copy of his lab work to his rheumatologist lab work was  reviewed with patient today 20 minutes additional was spent with the patient in regards to chronic pain a.m. prediabetes and reviewing his labs in detail regarding this Prediabetes important for the patient to minimize starches in the diet no need for any medications currently   Patient will need shingles vaccine later but just recently received Covid vaccine so we will give him some space

## 2019-10-17 NOTE — Patient Instructions (Addendum)
Results for orders placed or performed in visit on 10/07/19  Lipid Profile  Result Value Ref Range   Cholesterol, Total 138 100 - 199 mg/dL   Triglycerides 103 0 - 149 mg/dL   HDL 58 >39 mg/dL   VLDL Cholesterol Cal 19 5 - 40 mg/dL   LDL Chol Calc (NIH) 61 0 - 99 mg/dL   Chol/HDL Ratio 2.4 0.0 - 5.0 ratio  Hepatic function panel  Result Value Ref Range   Total Protein 6.7 6.0 - 8.5 g/dL   Albumin 4.5 3.7 - 4.7 g/dL   Bilirubin Total 1.1 0.0 - 1.2 mg/dL   Bilirubin, Direct 0.25 0.00 - 0.40 mg/dL   Alkaline Phosphatase 76 39 - 117 IU/L   AST 20 0 - 40 IU/L   ALT 13 0 - 44 IU/L  Basic Metabolic Panel (BMET)  Result Value Ref Range   Glucose 116 (H) 65 - 99 mg/dL   BUN 13 8 - 27 mg/dL   Creatinine, Ser 0.86 0.76 - 1.27 mg/dL   GFR calc non Af Amer 82 >59 mL/min/1.73   GFR calc Af Amer 95 >59 mL/min/1.73   BUN/Creatinine Ratio 15 10 - 24   Sodium 142 134 - 144 mmol/L   Potassium 4.6 3.5 - 5.2 mmol/L   Chloride 104 96 - 106 mmol/L   CO2 24 20 - 29 mmol/L   Calcium 9.4 8.6 - 10.2 mg/dL  B12  Result Value Ref Range   Vitamin B-12 610 232 - 1,245 pg/mL  Hemoglobin and Hematocrit, Blood  Result Value Ref Range   Hemoglobin 12.4 (L) 13.0 - 17.7 g/dL   Hematocrit 37.1 (L) 37.5 - 51.0 %   Thank you for coming for your annual wellness visit.  Please follow through on any advice that was given to you by today's visit. Remember to maintain compliance with your medications as discussed today.  Also remember it is important to eat a healthy diet and to stay physically active on a daily basis.  Please follow through with any testing or recommended followup office visits as was discussed today. You are due the following test coming up:  ALL-             Vaccines-shingles later this year         Finally remembered that the annual wellness visit does not take the place of regularly scheduled office visits  chronic health problems such as hypertension/diabetes/cholesterol visits.

## 2019-10-19 DIAGNOSIS — L570 Actinic keratosis: Secondary | ICD-10-CM | POA: Diagnosis not present

## 2019-10-19 DIAGNOSIS — Z08 Encounter for follow-up examination after completed treatment for malignant neoplasm: Secondary | ICD-10-CM | POA: Diagnosis not present

## 2019-10-19 DIAGNOSIS — Z85828 Personal history of other malignant neoplasm of skin: Secondary | ICD-10-CM | POA: Diagnosis not present

## 2019-10-19 DIAGNOSIS — Z1283 Encounter for screening for malignant neoplasm of skin: Secondary | ICD-10-CM | POA: Diagnosis not present

## 2019-10-19 DIAGNOSIS — D225 Melanocytic nevi of trunk: Secondary | ICD-10-CM | POA: Diagnosis not present

## 2019-10-19 DIAGNOSIS — B078 Other viral warts: Secondary | ICD-10-CM | POA: Diagnosis not present

## 2019-10-19 DIAGNOSIS — Z85820 Personal history of malignant melanoma of skin: Secondary | ICD-10-CM | POA: Diagnosis not present

## 2019-10-19 DIAGNOSIS — X32XXXD Exposure to sunlight, subsequent encounter: Secondary | ICD-10-CM | POA: Diagnosis not present

## 2019-10-25 DIAGNOSIS — R972 Elevated prostate specific antigen [PSA]: Secondary | ICD-10-CM | POA: Diagnosis not present

## 2019-11-02 DIAGNOSIS — N401 Enlarged prostate with lower urinary tract symptoms: Secondary | ICD-10-CM | POA: Diagnosis not present

## 2019-11-02 DIAGNOSIS — R972 Elevated prostate specific antigen [PSA]: Secondary | ICD-10-CM | POA: Diagnosis not present

## 2019-11-02 DIAGNOSIS — R351 Nocturia: Secondary | ICD-10-CM | POA: Diagnosis not present

## 2019-11-07 ENCOUNTER — Other Ambulatory Visit: Payer: Self-pay | Admitting: Rheumatology

## 2019-11-07 NOTE — Telephone Encounter (Signed)
Last Visit: 08/12/19 Next Visit: 01/10/20  Labs: 08/12/19 stable  Attempted to contact the patient to advise he is due for labs. Mailbox is full and unable to leave a message.   Okay to refill 30 day supply per Dr. Estanislado Pandy

## 2019-11-28 ENCOUNTER — Ambulatory Visit (INDEPENDENT_AMBULATORY_CARE_PROVIDER_SITE_OTHER): Payer: PPO | Admitting: Family Medicine

## 2019-11-28 VITALS — BP 142/74 | Temp 98.2°F | Wt 178.0 lb

## 2019-11-28 DIAGNOSIS — N3000 Acute cystitis without hematuria: Secondary | ICD-10-CM

## 2019-11-28 DIAGNOSIS — R6883 Chills (without fever): Secondary | ICD-10-CM | POA: Diagnosis not present

## 2019-11-28 DIAGNOSIS — R300 Dysuria: Secondary | ICD-10-CM

## 2019-11-28 LAB — POCT URINALYSIS DIPSTICK (MANUAL)
Nitrite, UA: POSITIVE — AB
Poct Blood: NEGATIVE
Spec Grav, UA: 1.025 (ref 1.010–1.025)
pH, UA: 5 (ref 5.0–8.0)

## 2019-11-28 MED ORDER — CEFPROZIL 500 MG PO TABS: 500.0000 mg | ORAL_TABLET | Freq: Two times a day (BID) | ORAL | 0 refills | Status: DC

## 2019-11-28 MED ORDER — CEFTRIAXONE SODIUM 500 MG IJ SOLR
500.0000 mg | Freq: Once | INTRAMUSCULAR | Status: AC
  Administered 2019-11-28: 500 mg via INTRAMUSCULAR

## 2019-11-28 MED ORDER — TAMSULOSIN HCL 0.4 MG PO CAPS: 0.4000 mg | ORAL_CAPSULE | Freq: Every day | ORAL | 0 refills | Status: DC

## 2019-11-28 NOTE — Progress Notes (Signed)
   Subjective:    Patient ID: Bryan Homans Sr., male    DOB: Aug 25, 1941, 79 y.o.   MRN: [id number]  HPI Patient states he started having chills and burning with urination Friday. Patient feels like he is unable to empty his bladder completely.  Patient denies flank pain relates a lot of urinary frequency slight dysuria low urine flow Denies nausea vomiting Energy level subpar Appetite fair Had multiple chills on Saturday evening Sunday and Monday morning. Relates urine flow is significantly depressed No other concerns today. Results for orders placed or performed in visit on 11/28/19  POCT Urinalysis Dip Manual  Result Value Ref Range   Spec Grav, UA 1.025 1.010 - 1.025   pH, UA 5.0 5.0 - 8.0   Leukocytes, UA Large (3+) (A) Negative   Nitrite, UA Positive (A) Negative   Poct Protein     Poct Glucose     Poct Ketones     Poct Urobilinogen     Poct Bilirubin     Poct Blood Negative Negative, trace    Review of Systems  Constitutional: Negative for diaphoresis and fatigue.  HENT: Negative for congestion and rhinorrhea.   Respiratory: Negative for cough and shortness of breath.   Cardiovascular: Negative for chest pain and leg swelling.  Gastrointestinal: Negative for abdominal pain and diarrhea.  Genitourinary: Positive for difficulty urinating, dysuria and frequency.  Skin: Negative for color change and rash.  Neurological: Negative for dizziness and headaches.  Psychiatric/Behavioral: Negative for behavioral problems and confusion.       Objective:   Physical Exam Lungs are clear respiratory rate normal heart regular no murmurs extremities no edema skin warm dry flanks nontender UA with WBCs  Abdomen soft no guarding or rebound    Assessment & Plan:  UTI possible early pyelonephritis having some systemic symptoms with chills but I doubt that he has sepsis based upon examination recommend Rocephin shot Flomax to help with urinary flow Recheck in 2 weeks Antibiotics  for 10 days Culture sent Warning signs when to go to ER were discussed

## 2019-11-28 NOTE — Patient Instructions (Signed)
Tylenol as needed for any fever Utilize Flomax-tamsulosin 1 each evening this will help with urinary flow Keep well-hydrated Take the antibiotic twice daily for the next 10 days If progressive symptoms or worsening issues please follow-up or call us If emergency situation or unable to urinate at all go to ER   Follow-up here in 2 weeks for recheck

## 2019-12-01 LAB — URINE CULTURE

## 2019-12-02 ENCOUNTER — Other Ambulatory Visit: Payer: Self-pay | Admitting: *Deleted

## 2019-12-02 MED ORDER — CIPROFLOXACIN HCL 500 MG PO TABS: 500.0000 mg | ORAL_TABLET | Freq: Two times a day (BID) | ORAL | 0 refills | Status: AC

## 2019-12-07 ENCOUNTER — Other Ambulatory Visit: Payer: Self-pay

## 2019-12-07 DIAGNOSIS — Z79899 Other long term (current) drug therapy: Secondary | ICD-10-CM

## 2019-12-08 ENCOUNTER — Other Ambulatory Visit: Payer: Self-pay | Admitting: Rheumatology

## 2019-12-08 ENCOUNTER — Telehealth: Payer: Self-pay | Admitting: Rheumatology

## 2019-12-08 LAB — COMPLETE METABOLIC PANEL WITH GFR
AG Ratio: 1.4 (calc) (ref 1.0–2.5)
ALT: 21 U/L (ref 9–46)
AST: 22 U/L (ref 10–35)
Albumin: 3.7 g/dL (ref 3.6–5.1)
Alkaline phosphatase (APISO): 58 U/L (ref 35–144)
BUN: 15 mg/dL (ref 7–25)
CO2: 29 mmol/L (ref 20–32)
Calcium: 8.9 mg/dL (ref 8.6–10.3)
Chloride: 104 mmol/L (ref 98–110)
Creat: 0.73 mg/dL (ref 0.70–1.18)
GFR, Est African American: 102 mL/min/{1.73_m2} (ref >=60)
GFR, Est Non African American: 88 mL/min/{1.73_m2} (ref >=60)
Globulin: 2.6 g/dL (calc) (ref 1.9–3.7)
Glucose, Bld: 115 mg/dL — ABNORMAL HIGH (ref 65–99)
Potassium: 4.4 mmol/L (ref 3.5–5.3)
Sodium: 141 mmol/L (ref 135–146)
Total Bilirubin: 0.7 mg/dL (ref 0.2–1.2)
Total Protein: 6.3 g/dL (ref 6.1–8.1)

## 2019-12-08 LAB — CBC WITH DIFFERENTIAL/PLATELET
Absolute Monocytes: 315 cells/uL (ref 200–950)
Basophils Absolute: 34 cells/uL (ref 0–200)
Basophils Relative: 0.5 %
Eosinophils Absolute: 114 cells/uL (ref 15–500)
Eosinophils Relative: 1.7 %
HCT: 34.1 % — ABNORMAL LOW (ref 38.5–50.0)
Hemoglobin: 11.1 g/dL — ABNORMAL LOW (ref 13.2–17.1)
Lymphs Abs: 2647 cells/uL (ref 850–3900)
MCH: 32.6 pg (ref 27.0–33.0)
MCHC: 32.6 g/dL (ref 32.0–36.0)
MCV: 100.3 fL — ABNORMAL HIGH (ref 80.0–100.0)
MPV: 9.3 fL (ref 7.5–12.5)
Monocytes Relative: 4.7 %
Neutro Abs: 3591 cells/uL (ref 1500–7800)
Neutrophils Relative %: 53.6 %
Platelets: 241 10*3/uL (ref 140–400)
RBC: 3.4 10*6/uL — ABNORMAL LOW (ref 4.20–5.80)
RDW: 13.6 % (ref 11.0–15.0)
Total Lymphocyte: 39.5 %
WBC: 6.7 10*3/uL (ref 3.8–10.8)

## 2019-12-08 NOTE — Telephone Encounter (Signed)
Ok to refill 90 day supply of MTX

## 2019-12-08 NOTE — Telephone Encounter (Signed)
Patient needs a refill on MTX for 90 days sent to [facility] in [place].

## 2019-12-08 NOTE — Telephone Encounter (Signed)
Prescription sent to the pharmacy.

## 2019-12-08 NOTE — Progress Notes (Signed)
Glucose is 115. Rest of CMP WNL.  RBC count, Hgb and hct are low and trending down. Please make sure the patient is taking folic acid 2 mg po daily.  Please notify patient and forward labs to PCP for further evaluation.

## 2019-12-08 NOTE — Telephone Encounter (Signed)
Last Visit: 08/12/19 Next Visit: 01/10/20  Labs: 12/08/19 RBC 3.40, Hgb 11.1, Hct 34.1, MCV 100.3 Glucose 115  Okay to refill MTX?

## 2019-12-11 ENCOUNTER — Other Ambulatory Visit: Payer: Self-pay | Admitting: Family Medicine

## 2019-12-12 ENCOUNTER — Encounter: Payer: Self-pay | Admitting: Family Medicine

## 2019-12-12 ENCOUNTER — Ambulatory Visit (INDEPENDENT_AMBULATORY_CARE_PROVIDER_SITE_OTHER): Payer: PPO | Admitting: Family Medicine

## 2019-12-12 ENCOUNTER — Other Ambulatory Visit: Payer: Self-pay

## 2019-12-12 VITALS — BP 112/72 | Temp 97.5°F | Wt 173.0 lb

## 2019-12-12 DIAGNOSIS — N390 Urinary tract infection, site not specified: Secondary | ICD-10-CM

## 2019-12-12 DIAGNOSIS — L84 Corns and callosities: Secondary | ICD-10-CM

## 2019-12-12 DIAGNOSIS — J301 Allergic rhinitis due to pollen: Secondary | ICD-10-CM | POA: Diagnosis not present

## 2019-12-12 LAB — POCT URINALYSIS DIPSTICK (MANUAL)
Nitrite, UA: NEGATIVE
Spec Grav, UA: <=1.005 — AB (ref 1.010–1.025)
pH, UA: 5 (ref 5.0–8.0)

## 2019-12-12 MED ORDER — POTASSIUM CHLORIDE CRYS ER 20 MEQ PO TBCR: EXTENDED_RELEASE_TABLET | ORAL | 5 refills | Status: DC

## 2019-12-12 MED ORDER — FLUOXETINE HCL 10 MG PO CAPS: 10.0000 mg | ORAL_CAPSULE | Freq: Every day | ORAL | 4 refills | Status: DC

## 2019-12-12 MED ORDER — ATORVASTATIN CALCIUM 10 MG PO TABS: 10.0000 mg | ORAL_TABLET | Freq: Every day | ORAL | 1 refills | Status: DC

## 2019-12-12 MED ORDER — FUROSEMIDE 20 MG PO TABS: 20.0000 mg | ORAL_TABLET | ORAL | 1 refills | Status: DC | PRN

## 2019-12-12 NOTE — Progress Notes (Signed)
   Subjective:    Patient ID: Bryan Homans Sr., male    DOB: 1941/08/02, 79 y.o.   MRN: [id number]  HPI Patient comes in today for a 2 week follow up on a UTI. He denies any dysuria urinary frequency denies abdominal pain flank pain denies hematuria states overall tolerated medicine well Patient states he is not having any concerning symptoms.  Patient has complaints of some blistering on his head which he believes is from the antibiotic he completed.  Received a sunburn due to being outside  Results for orders placed or performed in visit on 12/12/19  POCT Urinalysis Dip Manual  Result Value Ref Range   Spec Grav, UA <=1.005 (A) 1.010 - 1.025   pH, UA 5.0 5.0 - 8.0   Leukocytes, UA     Nitrite, UA Negative Negative   Poct Protein     Poct Glucose     Poct Ketones     Poct Urobilinogen     Poct Bilirubin     Poct Blood      Review of Systems  Constitutional: Negative for activity change, appetite change and fatigue.  HENT: Negative for congestion and rhinorrhea.   Respiratory: Negative for cough and shortness of breath.   Cardiovascular: Negative for chest pain and leg swelling.  Gastrointestinal: Negative for abdominal pain, nausea and vomiting.  Neurological: Negative for dizziness and headaches.  Psychiatric/Behavioral: Negative for agitation and behavioral problems.       Objective:   Physical Exam Vitals reviewed.  Cardiovascular:     Rate and Rhythm: Normal rate and regular rhythm.     Heart sounds: Normal heart sounds. No murmur.  Pulmonary:     Effort: Pulmonary effort is normal.     Breath sounds: Normal breath sounds.  Lymphadenopathy:     Cervical: No cervical adenopathy.  Neurological:     Mental Status: He is alert.  Psychiatric:        Behavior: Behavior normal.     The sunburn on the back of the head is not blistered and should heal up without difficulty      Assessment & Plan:  1. Urinary tract infection without hematuria, site  unspecified Urinary infection is cleared up urine looks good under the microscope patient without symptoms - POCT Urinalysis Dip Manual  2. Seasonal allergic rhinitis due to pollen I recommend OTC Claritin 1 daily should not affect his urinary flow  3. Pre-ulcerative corn or callous I recommend he see a podiatrist he is thinks he already has a foot doctor he will follow up with them  Refills of his usual medicine  Hemoglobin slightly low but points in the direction more related to chronic illness rather than due to blood loss  Follow-up 4 months

## 2019-12-13 ENCOUNTER — Encounter: Payer: Self-pay | Admitting: *Deleted

## 2020-01-03 NOTE — Progress Notes (Signed)
Office Visit Note  Patient: Bryan Brandle Ovens Sr.             Date of Birth: 09/22/1940           MRN: [id number]             PCP: Kathyrn Drown, MD Referring: Kathyrn Drown, MD Visit Date: 01/10/2020 Occupation: @GUAROCC @  Subjective:  Medication monitoring   History of Present Illness: Bryan TACK Sr. is a 79 y.o. male with history of seropositive rheumatoid arthritis.  He is taking methotrexate 6 tablets by mouth once weekly and folic acid 2 mg po daily.  He states he has intermittent pain and swelling in both knee replacements, both ankle joints, and both feet. He states he has noticed increased instability and has more frequent falls.  He uses voltaren gel topically as needed for pain relief.    Activities of Daily Living:  Patient reports morning stiffness for 15 minutes.   Patient Reports nocturnal pain.  Difficulty dressing/grooming: Reports Difficulty climbing stairs: Denies Difficulty getting out of chair: Denies Difficulty using hands for taps, buttons, cutlery, and/or writing: Reports  Review of Systems  Constitutional: Positive for fatigue. Negative for night sweats.  HENT: Positive for mouth dryness. Negative for mouth sores and nose dryness.   Eyes: Positive for dryness. Negative for redness.  Respiratory: Negative for shortness of breath and difficulty breathing.   Cardiovascular: Positive for swelling in legs/feet. Negative for chest pain, palpitations, hypertension and irregular heartbeat.  Gastrointestinal: Negative for constipation and diarrhea.  Endocrine: Negative for excessive thirst and increased urination.  Genitourinary: Negative for painful urination.  Musculoskeletal: Positive for arthralgias, gait problem, joint pain, joint swelling, muscle weakness and morning stiffness. Negative for myalgias, muscle tenderness and myalgias.  Skin: Negative for color change, rash, hair loss, nodules/bumps, skin tightness, ulcers and sensitivity to sunlight.    Allergic/Immunologic: Negative for susceptible to infections.  Neurological: Negative for dizziness, fainting, memory loss and night sweats.  Hematological: Positive for bruising/bleeding tendency. Negative for swollen glands.  Psychiatric/Behavioral: Negative for depressed mood and sleep disturbance. The patient is not nervous/anxious.     PMFS History:  Patient Active Problem List   Diagnosis Date Noted  . High risk medication use 09/19/2016  . History of total knee replacement, bilateral 09/19/2016  . Gastroesophageal reflux disease without esophagitis 09/19/2016  . History of melanoma 09/19/2016  . History of basal cell carcinoma 09/19/2016  . History of anemia 09/19/2016  . Allergic rhinitis 02/18/2016  . Melanoma of skin ([place]) 06/06/2015  . Prediabetes 11/20/2014  . Hyperlipidemia 11/20/2014  . BPH (benign prostatic hyperplasia) 05/22/2014  . Rheumatoid arthritis involving multiple sites with positive rheumatoid factor ([place]) 01/05/2013  . Chronic pain syndrome 01/05/2013    Past Medical History:  Diagnosis Date  . Anemia   . Basal cell carcinoma   . BPH (benign prostatic hyperplasia)   . GERD (gastroesophageal reflux disease)   . Melanoma ([place])   . Osteoarthritis   . Rheumatoid aortitis   . Rheumatoid arthritis ([place])     Family History  Problem Relation Age of Onset  . Rheum arthritis Mother   . Emphysema Father   . Cancer Brother   . Emphysema Sister    Past Surgical History:  Procedure Laterality Date  . COLONOSCOPY    . HERNIA REPAIR    . JOINT REPLACEMENT Bilateral   . KNEE ARTHROPLASTY    . MELANOMA EXCISION    . MELANOMA EXCISION  09/22/2018  on head   Social History   Social History Narrative  . Not on file   Immunization History  Administered Date(s) Administered  . Influenza Split 05/19/2013  . Influenza,inj,Quad PF,6+ Mos 05/22/2014, 05/22/2015, 06/06/2016, 06/10/2017, 05/14/2018  . Influenza-Unspecified 05/20/2019  . Pneumococcal  Conjugate-13 05/22/2014  . Pneumococcal Polysaccharide-23 05/19/2013  . Zoster Recombinat (Shingrix) 09/06/2018, 03/11/2019     Objective: Vital Signs: BP (!) 107/53 (BP Location: Left Arm, Patient Position: Sitting, Cuff Size: Normal)   Pulse 71   Resp 18   Ht 5' 7.5" (1.715 m)   Wt 169 lb 9.6 oz (76.9 kg)   BMI 26.17 kg/m    Physical Exam Vitals and nursing note reviewed.  Constitutional:      Appearance: He is well-developed.  HENT:     Head: Normocephalic and atraumatic.  Eyes:     Conjunctiva/sclera: Conjunctivae normal.     Pupils: Pupils are equal, round, and reactive to light.  Pulmonary:     Effort: Pulmonary effort is normal.  Abdominal:     General: Bowel sounds are normal.     Palpations: Abdomen is soft.  Musculoskeletal:     Cervical back: Normal range of motion and neck supple.  Skin:    General: Skin is warm and dry.     Capillary Refill: Capillary refill takes less than 2 seconds.  Neurological:     Mental Status: He is alert and oriented to person, place, and time.  Psychiatric:        Behavior: Behavior normal.      Musculoskeletal Exam: C-spine limited ROM.  Thoracic kyphosis noted.  Limited abduction of both shoulders to 90 degrees.  Bilateral elbow joint contractures with thickening.  Extremely minimal ROM of both wrist joints.  Synovial thickening of MCPs and PIP joints.  Ulnar deviation and contractures of MCP joints. Swan neck deformities noted.  Bilateral knee replacements have good ROM with warmth.  Synovial thickening and limited ROM of both ankle joints.   Pedal edema bilaterally.   CDAI Exam: CDAI Score: 1  Patient Global: 5 mm; Provider Global: 5 mm Swollen: 0 ; Tender: 0  Joint Exam 01/10/2020   No joint exam has been documented for this visit   There is currently no information documented on the homunculus. Go to the Rheumatology activity and complete the homunculus joint exam.  Investigation: No additional  findings.  Imaging: No results found.  Recent Labs: Lab Results  Component Value Date   WBC 6.7 12/07/2019   HGB 11.1 (L) 12/07/2019   PLT 241 12/07/2019   NA 141 12/07/2019   K 4.4 12/07/2019   CL 104 12/07/2019   CO2 29 12/07/2019   GLUCOSE 115 (H) 12/07/2019   BUN 15 12/07/2019   CREATININE 0.73 12/07/2019   BILITOT 0.7 12/07/2019   ALKPHOS 76 10/10/2019   AST 22 12/07/2019   ALT 21 12/07/2019   PROT 6.3 12/07/2019   ALBUMIN 4.5 10/10/2019   CALCIUM 8.9 12/07/2019   GFRAA 102 12/07/2019    Speciality Comments: No specialty comments available.  Procedures:  No procedures performed Allergies: Morphine and related and Acyclovir and related   Assessment / Plan:     Visit Diagnoses: Rheumatoid arthritis involving multiple sites with positive rheumatoid factor (HCC) - Positive rheumatoid factor, positive anti-CCP, severe erosive disease with nodulosis and contractures: He has no active synovitis or tenderness on exam today.  He has synovial thickening and contractures of multiple joints as described above.  He experiences intermittent discomfort in  both knee replacements, both ankles, and both feet.  He has warmth of bilateral knee replacements on exam but no effusion noted.  No warmth or tenderness of ankle joints today.  He takes Tylenol #3 and uses Voltaren gel topically as needed for pain relief.  Overall he is clinically stable on methotrexate 6 tablets by mouth once weekly and folic acid 2 mg by mouth daily.  He will continue on this current treatment regimen.  He does not need any refills at this time.  He was advised to notify us if he develops increased joint inflammation.  He will follow-up in the office in 5 months.  High risk medication use - MTX 2.5 mg 6 tablets every 7 days and folic acid 1 mg 2 tablets daily.  CBC and CMP were stable on 12/07/2019.  He will be due to update lab work in July and every 3 months to monitor for drug toxicity.  Standing orders are in place.   He was advised to hold methotrexate if he develops any signs or symptoms of an infection and to resume once the infection has completely cleared.  History of total knee replacement, bilateral: He has intermittent pain in both knee replacements.  He has good ROM bilaterally.  Warmth but no effusion noted.    Chronic pain syndrome: He is taking tylenol #3 for pain relief.   Other medical conditions are listed as follows:   Prediabetes  History of melanoma  History of gastroesophageal reflux (GERD)  History of basal cell carcinoma  History of hyperlipidemia  History of anemia  Orders: No orders of the defined types were placed in this encounter.  No orders of the defined types were placed in this encounter.    Follow-Up Instructions: Return in about 5 months (around 06/11/2020) for Rheumatoid arthritis.   Hazel Sams, PA-C I examined and evaluated the patient with Hazel Sams PA.  Patient has synovial thickening and multiple deformities but no active synovitis on my examination.  We will continue current treatment.  The plan of care was discussed as noted above.  Bo Merino, MD  Note - This record has been created using Editor, commissioning.  Chart creation errors have been sought, but may not always  have been located. Such creation errors do not reflect on  the standard of medical care.

## 2020-01-04 DIAGNOSIS — H25813 Combined forms of age-related cataract, bilateral: Secondary | ICD-10-CM | POA: Diagnosis not present

## 2020-01-10 ENCOUNTER — Encounter: Payer: Self-pay | Admitting: Rheumatology

## 2020-01-10 ENCOUNTER — Ambulatory Visit: Payer: PPO | Admitting: Rheumatology

## 2020-01-10 ENCOUNTER — Other Ambulatory Visit: Payer: Self-pay

## 2020-01-10 VITALS — BP 107/53 | HR 71 | Resp 18 | Ht 67.5 in | Wt 169.6 lb

## 2020-01-10 DIAGNOSIS — M0579 Rheumatoid arthritis with rheumatoid factor of multiple sites without organ or systems involvement: Secondary | ICD-10-CM

## 2020-01-10 DIAGNOSIS — Z8719 Personal history of other diseases of the digestive system: Secondary | ICD-10-CM | POA: Diagnosis not present

## 2020-01-10 DIAGNOSIS — Z8639 Personal history of other endocrine, nutritional and metabolic disease: Secondary | ICD-10-CM

## 2020-01-10 DIAGNOSIS — Z85828 Personal history of other malignant neoplasm of skin: Secondary | ICD-10-CM

## 2020-01-10 DIAGNOSIS — Z96653 Presence of artificial knee joint, bilateral: Secondary | ICD-10-CM

## 2020-01-10 DIAGNOSIS — R7303 Prediabetes: Secondary | ICD-10-CM

## 2020-01-10 DIAGNOSIS — Z862 Personal history of diseases of the blood and blood-forming organs and certain disorders involving the immune mechanism: Secondary | ICD-10-CM

## 2020-01-10 DIAGNOSIS — Z85820 Personal history of malignant melanoma of skin: Secondary | ICD-10-CM

## 2020-01-10 DIAGNOSIS — Z79899 Other long term (current) drug therapy: Secondary | ICD-10-CM

## 2020-01-10 DIAGNOSIS — G894 Chronic pain syndrome: Secondary | ICD-10-CM

## 2020-01-10 NOTE — Patient Instructions (Signed)
Standing Labs We placed an order today for your standing lab work.    Please come back and get your standing labs in July and every 3 months  We have open lab daily Monday through Thursday from 8:30-12:30 PM and 1:30-4:30 PM and Friday from 8:30-12:30 PM and 1:30-4:00 PM at the office of Dr. Shaili Deveshwar.   You may experience shorter wait times on Monday and Friday afternoons. The office is located at [address], [address], [place], [place] [address] No appointment is necessary.   Labs are drawn by [facility].  You may receive a bill from [facility] for your lab work.  If you wish to have your labs drawn at another location, please call the office 24 hours in advance to send orders.  If you have any questions regarding directions or hours of operation,  please call [phone].   Just as a reminder please drink plenty of water prior to coming for your lab work. Thanks!  

## 2020-01-16 ENCOUNTER — Ambulatory Visit: Payer: PPO | Admitting: Family Medicine

## 2020-02-09 DIAGNOSIS — H25813 Combined forms of age-related cataract, bilateral: Secondary | ICD-10-CM | POA: Diagnosis not present

## 2020-02-21 ENCOUNTER — Telehealth: Payer: Self-pay | Admitting: Family Medicine

## 2020-02-21 NOTE — Telephone Encounter (Signed)
Pt dropped off form for Dr. Nicki Reaper to review before appt on 8/12.  Placed in providers office

## 2020-02-22 NOTE — Telephone Encounter (Signed)
I reviewed over the form It was basically at home wellness exam It would be wise to do cognitive screening on the patient when he comes

## 2020-02-27 ENCOUNTER — Other Ambulatory Visit: Payer: Self-pay | Admitting: Rheumatology

## 2020-02-27 NOTE — Telephone Encounter (Signed)
Ok to refill Methotrexate 6 tablets by mouth once weekly.

## 2020-02-27 NOTE — Telephone Encounter (Signed)
Verified verbal prescription with Hazel Sams, PA-C to send in Methotrexate 6 tablets by mouth once weekly #72 no refills.

## 2020-02-27 NOTE — Telephone Encounter (Signed)
Last Visit: 01/10/2020 Next Visit: 06/06/2020 Labs: 12/07/2019 Glucose is 115. Rest of CMP WNL. RBC count, Hgb and hct are low and trending down  Current Dose per office note 01/10/2020: MTX 2.5 mg 6 tablets every 7 days  DX:  Rheumatoid arthritis   Patient advised he is due to update labs at the beginning of July 2021.   Okay to refill MTX?

## 2020-02-28 NOTE — H&P (Signed)
Surgical History & Physical  Patient Name: Bryan Maldonado DOB: February 25, 1941  Surgery: Cataract extraction with intraocular lens implant phacoemulsification; Right Eye  Surgeon: Baruch Goldmann MD Surgery Date:  03/12/2020 Pre-Op Date:  02/28/2020  HPI: A 52 Yr. old male patient presents for a cataract evaluation. PT reports a long hx of amblyopia, OS, with an associated eye turn (ET). Pt reports no ocular surgery. PT reports blurred vision in both eyes, however he was told he was having surgery on his right eye first. Pt reports poor night vision due to halos and glare. Pt states distance vision is also compromised as he feels he needs to get closer to street signs. He states this is negatively affecting the patient's quality of life. No eye pain. No flashes or floaters. HPI was performed by Baruch Goldmann .  Medical History: Dry Eyes Cataracts Strabismus: Esotropia OS Arthritis Cancer  Review of Systems Negative Allergic/Immunologic Negative Cardiovascular Negative Constitutional Negative Ear, Nose, Mouth & Throat Negative Endocrine Negative Eyes Negative Gastrointestinal Negative Genitourinary Negative Hemotologic/Lymphatic Negative Integumentary Negative Musculoskeletal Negative Neurological Negative Psychiatry Negative Respiratory  Social   Never smoked  Medication Artifical Tears,  Acetaminophinen-codeine, Atorvastatin, Calcium, Cetirizine Fluoxetine, Folic acid, Durosemide, Methotrexate, Omeprazole, Potassium chloride, Salmon oil, Tamsulosin, Zinc, Vitamin C,   Sx/Procedures Knee replacement,   Drug Allergies  Morphine,   History & Physical: Heent:  Cataract, Right eye NECK: supple without bruits LUNGS: lungs clear to auscultation CV: regular rate and rhythm Abdomen: soft and non-tender  Impression & Plan: Assessment: 1.  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  BLINDNESS LEFT EYE CATEGORY 4, NORMAL VISION RIGHT EYE (H54.42A4)  Plan: 1.   Cataract accounts for the patient's decreased vision. This visual impairment is not correctable with a tolerable change in glasses or contact lenses. Cataract surgery with an implantation of a new lens should significantly improve the visual and functional status of the patient. Discussed all risks, benefits, alternatives, and potential complications. Discussed the procedures and recovery. Patient desires to have surgery. A-scan ordered and performed today for intra-ocular lens calculations. The surgery will be performed in order to improve vision for driving, reading, and for eye examinations. Recommend phacoemulsification with intra-ocular lens. Right Eye functional eye - first. Dilates poorly - shugacaine by protocol. Malyugin Ring. Omidira. Flomax. 2.  Monocular precautions discussed, including wearing shatterproof lenses.

## 2020-03-06 ENCOUNTER — Other Ambulatory Visit: Payer: Self-pay

## 2020-03-06 DIAGNOSIS — Z79899 Other long term (current) drug therapy: Secondary | ICD-10-CM

## 2020-03-07 LAB — CBC WITH DIFFERENTIAL/PLATELET
Absolute Monocytes: 510 cells/uL (ref 200–950)
Basophils Absolute: 28 cells/uL (ref 0–200)
Basophils Relative: 0.5 %
Eosinophils Absolute: 101 cells/uL (ref 15–500)
Eosinophils Relative: 1.8 %
HCT: 34.3 % — ABNORMAL LOW (ref 38.5–50.0)
Hemoglobin: 11.5 g/dL — ABNORMAL LOW (ref 13.2–17.1)
Lymphs Abs: 2576 cells/uL (ref 850–3900)
MCH: 33.4 pg — ABNORMAL HIGH (ref 27.0–33.0)
MCHC: 33.5 g/dL (ref 32.0–36.0)
MCV: 99.7 fL (ref 80.0–100.0)
MPV: 9.8 fL (ref 7.5–12.5)
Monocytes Relative: 9.1 %
Neutro Abs: 2386 cells/uL (ref 1500–7800)
Neutrophils Relative %: 42.6 %
Platelets: 165 10*3/uL (ref 140–400)
RBC: 3.44 10*6/uL — ABNORMAL LOW (ref 4.20–5.80)
RDW: 13.2 % (ref 11.0–15.0)
Total Lymphocyte: 46 %
WBC: 5.6 10*3/uL (ref 3.8–10.8)

## 2020-03-07 LAB — COMPLETE METABOLIC PANEL WITH GFR
AG Ratio: 1.5 (calc) (ref 1.0–2.5)
ALT: 12 U/L (ref 9–46)
AST: 17 U/L (ref 10–35)
Albumin: 4 g/dL (ref 3.6–5.1)
Alkaline phosphatase (APISO): 65 U/L (ref 35–144)
BUN: 17 mg/dL (ref 7–25)
CO2: 27 mmol/L (ref 20–32)
Calcium: 9 mg/dL (ref 8.6–10.3)
Chloride: 106 mmol/L (ref 98–110)
Creat: 0.75 mg/dL (ref 0.70–1.18)
GFR, Est African American: 101 mL/min/{1.73_m2} (ref >=60)
GFR, Est Non African American: 87 mL/min/{1.73_m2} (ref >=60)
Globulin: 2.6 g/dL (calc) (ref 1.9–3.7)
Glucose, Bld: 129 mg/dL — ABNORMAL HIGH (ref 65–99)
Potassium: 4.2 mmol/L (ref 3.5–5.3)
Sodium: 141 mmol/L (ref 135–146)
Total Bilirubin: 0.7 mg/dL (ref 0.2–1.2)
Total Protein: 6.6 g/dL (ref 6.1–8.1)

## 2020-03-07 NOTE — Progress Notes (Signed)
Glucose is elevated-129.  Rest of CMP WNL.  RBC count, Hgb, and hct are low but stable.  Please notify the patient and forward labs to PCP.

## 2020-03-08 ENCOUNTER — Other Ambulatory Visit (HOSPITAL_COMMUNITY)
Admission: RE | Admit: 2020-03-08 | Discharge: 2020-03-08 | Disposition: A | Discharge status: Home / Self Care | Payer: PPO | Source: Ambulatory Visit | Attending: Ophthalmology | Admitting: Ophthalmology

## 2020-03-08 ENCOUNTER — Encounter (HOSPITAL_COMMUNITY)
Admission: RE | Admit: 2020-03-08 | Discharge: 2020-03-08 | Disposition: A | Discharge status: Home / Self Care | Payer: PPO | Source: Ambulatory Visit | Attending: Ophthalmology | Admitting: Ophthalmology

## 2020-03-08 ENCOUNTER — Other Ambulatory Visit: Payer: Self-pay

## 2020-03-08 DIAGNOSIS — H25811 Combined forms of age-related cataract, right eye: Secondary | ICD-10-CM | POA: Diagnosis not present

## 2020-03-08 NOTE — Patient Instructions (Signed)
@[email or web address] @   Your procedure is scheduled on:  03/12/2020               Report to [facility] at 8:00    AM.                Call this number if you have problems the morning of surgery: [phone]   Do not eat or drink :After Midnight.    Take these medicines the morning of surgery with A SIP OF WATER:  Prozac, Prilosec, tylenol # 3          Do not wear jewelry, make-up or nail polish.  Do not wear lotions, powders, or perfumes. You may wear deodorant.  Do not bring valuables to the hospital.  Contacts, dentures or bridgework may not be worn into surgery.  Patients discharged the day of surgery will not be allowed to drive home.  Name and phone number of your driver.                                                                                                                                       Cataract Surgery  A cataract is a clouding of the lens of the eye. When a lens becomes cloudy, vision is reduced based on the degree and nature of the clouding. Surgery may be needed to improve vision. Surgery removes the cloudy lens and usually replaces it with a substitute lens (intraocular lens, IOL). LET YOUR EYE DOCTOR KNOW ABOUT:  Allergies to food or medicine.   Medicines taken including herbs, eyedrops, over-the-counter medicines, and creams.   Use of steroids (by mouth or creams).   Previous problems with anesthetics or numbing medicine.   History of bleeding problems or blood clots.   Previous surgery.   Other health problems, including diabetes and kidney problems.   Possibility of pregnancy, if this applies.  RISKS AND COMPLICATIONS  Infection.   Inflammation of the eyeball (endophthalmitis) that can spread to both eyes (sympathetic ophthalmia).   Poor wound healing.   If an IOL is inserted, it can later fall out of proper position. This is very uncommon.   Clouding of the part of your eye that holds an IOL in place. This is called an "after-cataract." These  are uncommon, but easily treated.  BEFORE THE PROCEDURE  Do not eat or drink anything except small amounts of water for 8 to 12 before your surgery, or as directed by your caregiver.    Unless you are told otherwise, continue any eyedrops you have been prescribed.   Talk to your primary caregiver about all other medicines that you take (both prescription and non-prescription). In some cases, you may need to stop or change medicines near the time of your surgery. This is most important if you are taking blood-thinning medicine. Do not stop medicines unless you are told to do so.   Arrange for someone to drive  you to and from the procedure.   Do not put contact lenses in either eye on the day of your surgery.  PROCEDURE There is more than one method for safely removing a cataract. Your doctor can explain the differences and help determine which is best for you. Phacoemulsification surgery is the most common form of cataract surgery.  An injection is given behind the eye or eyedrops are given to make this a painless procedure.   A small cut (incision) is made on the edge of the clear, dome-shaped surface that covers the front of the eye (cornea).   A tiny probe is painlessly inserted into the eye. This device gives off ultrasound waves that soften and break up the cloudy center of the lens. This makes it easier for the cloudy lens to be removed by suction.   An IOL may be implanted.   The normal lens of the eye is covered by a clear capsule. Part of that capsule is intentionally left in the eye to support the IOL.   Your surgeon may or may not use stitches to close the incision.  There are other forms of cataract surgery that require a larger incision and stiches to close the eye. This approach is taken in cases where the doctor feels that the cataract cannot be easily removed using phacoemulsification. AFTER THE PROCEDURE  When an IOL is implanted, it does not need care. It becomes a  permanent part of your eye and cannot be seen or felt.   Your doctor will schedule follow-up exams to check on your progress.   Review your other medicines with your doctor to see which can be resumed after surgery.   Use eyedrops or take medicine as prescribed by your doctor.  Document Released: 08/07/2011 Document Reviewed: 08/04/2011 [facility] Patient Information 2012 [place].  .Cataract Surgery Care After Refer to this sheet in the next few weeks. These instructions provide you with information on caring for yourself after your procedure. Your caregiver may also give you more specific instructions. Your treatment has been planned according to current medical practices, but problems sometimes occur. Call your caregiver if you have any problems or questions after your procedure.  HOME CARE INSTRUCTIONS   Avoid strenuous activities as directed by your caregiver.   Ask your caregiver when you can resume driving.   Use eyedrops or other medicines to help healing and control pressure inside your eye as directed by your caregiver.   Only take over-the-counter or prescription medicines for pain, discomfort, or fever as directed by your caregiver.   Do not to touch or rub your eyes.   You may be instructed to use a protective shield during the first few days and nights after surgery. If not, wear sunglasses to protect your eyes. This is to protect the eye from pressure or from being accidentally bumped.   Keep the area around your eye clean and dry. Avoid swimming or allowing water to hit you directly in the face while showering. Keep soap and shampoo out of your eyes.   Do not bend or lift heavy objects. Bending increases pressure in the eye. You can walk, climb stairs, and do light household chores.   Do not put a contact lens into the eye that had surgery until your caregiver says it is okay to do so.   Ask your doctor when you can return to work. This will depend on the kind of  work that you do. If you work in a  dusty environment, you may be advised to wear protective eyewear for a period of time.   Ask your caregiver when it will be safe to engage in sexual activity.   Continue with your regular eye exams as directed by your caregiver.  What to expect:  It is normal to feel itching and mild discomfort for a few days after cataract surgery. Some fluid discharge is also common, and your eye may be sensitive to light and touch.   After 1 to 2 days, even moderate discomfort should disappear. In most cases, healing will take about 6 weeks.   If you received an intraocular lens (IOL), you may notice that colors are very bright or have a blue tinge. Also, if you have been in bright sunlight, everything may appear reddish for a few hours. If you see these color tinges, it is because your lens is clear and no longer cloudy. Within a few months after receiving an IOL, these extra colors should go away. When you have healed, you will probably need new glasses.  SEEK MEDICAL CARE IF:   You have increased bruising around your eye.   You have discomfort not helped by medicine.  SEEK IMMEDIATE MEDICAL CARE IF:   You have a  fever.   You have a worsening or sudden vision loss.   You have redness, swelling, or increasing pain in the eye.   You have a thick discharge from the eye that had surgery.  MAKE SURE YOU:  Understand these instructions.   Will watch your condition.   Will get help right away if you are not doing well or get worse.  Document Released: 03/07/2005 Document Revised: 08/07/2011 Document Reviewed: 04/11/2011 [facility] Patient Information 2012 [place].    Monitored Anesthesia Care  Monitored anesthesia care is an anesthesia service for a medical procedure. Anesthesia is the loss of the ability to feel pain. It is produced by medications called anesthetics. It may affect a small area of your body (local anesthesia), a large area of your body  (regional anesthesia), or your entire body (general anesthesia). The need for monitored anesthesia care depends your procedure, your condition, and the potential need for regional or general anesthesia. It is often provided during procedures where:   General anesthesia may be needed if there are complications. This is because you need special care when you are under general anesthesia.    You will be under local or regional anesthesia. This is so that you are able to have higher levels of anesthesia if needed.    You will receive calming medications (sedatives). This is especially the case if sedatives are given to put you in a semi-conscious state of relaxation (deep sedation). This is because the amount of sedative needed to produce this state can be hard to predict. Too much of a sedative can produce general anesthesia. Monitored anesthesia care is performed by one or more caregivers who have special training in all types of anesthesia. You will need to meet with these caregivers before your procedure. During this meeting, they will ask you about your medical history. They will also give you instructions to follow. (For example, you will need to stop eating and drinking before your procedure. You may also need to stop or change medications you are taking.) During your procedure, your caregivers will stay with you. They will:   Watch your condition. This includes watching you blood pressure, breathing, and level of pain.    Diagnose and treat problems that occur.  Give medications if they are needed. These may include calming medications (sedatives) and anesthetics.    Make sure you are comfortable.   Having monitored anesthesia care does not necessarily mean that you will be under anesthesia. It does mean that your caregivers will be able to manage anesthesia if you need it or if it occurs. It also means that you will be able to have a different type of anesthesia than you are having if you need  it. When your procedure is complete, your caregivers will continue to watch your condition. They will make sure any medications wear off before you are allowed to go home.  Document Released: 05/14/2005 Document Revised: 12/13/2012 Document Reviewed: 09/29/2012 [facility] Patient Information 2014 [place], [place].

## 2020-03-12 ENCOUNTER — Other Ambulatory Visit: Payer: Self-pay

## 2020-03-12 ENCOUNTER — Encounter (HOSPITAL_COMMUNITY)
Admission: RE | Disposition: A | Discharge status: Home / Self Care | Payer: Self-pay | Source: Home / Self Care | Attending: Ophthalmology

## 2020-03-12 ENCOUNTER — Ambulatory Visit (HOSPITAL_COMMUNITY): Payer: PPO | Admitting: Anesthesiology

## 2020-03-12 ENCOUNTER — Encounter (HOSPITAL_COMMUNITY): Payer: Self-pay | Admitting: Ophthalmology

## 2020-03-12 ENCOUNTER — Ambulatory Visit (HOSPITAL_COMMUNITY)
Admission: RE | Admit: 2020-03-12 | Discharge: 2020-03-12 | Disposition: A | Discharge status: Home / Self Care | Payer: PPO | Attending: Ophthalmology | Admitting: Ophthalmology

## 2020-03-12 DIAGNOSIS — H25813 Combined forms of age-related cataract, bilateral: Secondary | ICD-10-CM | POA: Insufficient documentation

## 2020-03-12 DIAGNOSIS — H2181 Floppy iris syndrome: Secondary | ICD-10-CM | POA: Diagnosis not present

## 2020-03-12 DIAGNOSIS — M1990 Unspecified osteoarthritis, unspecified site: Secondary | ICD-10-CM | POA: Diagnosis not present

## 2020-03-12 DIAGNOSIS — Z885 Allergy status to narcotic agent status: Secondary | ICD-10-CM | POA: Insufficient documentation

## 2020-03-12 DIAGNOSIS — Z79899 Other long term (current) drug therapy: Secondary | ICD-10-CM | POA: Insufficient documentation

## 2020-03-12 DIAGNOSIS — H5462 Unqualified visual loss, left eye, normal vision right eye: Secondary | ICD-10-CM | POA: Insufficient documentation

## 2020-03-12 DIAGNOSIS — K219 Gastro-esophageal reflux disease without esophagitis: Secondary | ICD-10-CM | POA: Insufficient documentation

## 2020-03-12 DIAGNOSIS — H25811 Combined forms of age-related cataract, right eye: Secondary | ICD-10-CM | POA: Diagnosis not present

## 2020-03-12 HISTORY — PX: CATARACT EXTRACTION W/PHACO: SHX586

## 2020-03-12 SURGERY — PHACOEMULSIFICATION, CATARACT, WITH IOL INSERTION
Anesthesia: Monitor Anesthesia Care | Site: Eye | Laterality: Right

## 2020-03-12 MED ORDER — PHENYLEPHRINE-KETOROLAC 1-0.3 % IO SOLN
INTRAOCULAR | Status: AC
  Filled 2020-03-12: qty 4

## 2020-03-12 MED ORDER — TETRACAINE HCL 0.5 % OP SOLN
1.0000 [drp] | OPHTHALMIC | Status: AC | PRN
  Administered 2020-03-12 (×3): 1 [drp] via OPHTHALMIC

## 2020-03-12 MED ORDER — PHENYLEPHRINE-KETOROLAC 1-0.3 % IO SOLN
INTRAOCULAR | Status: DC | PRN
  Administered 2020-03-12: 500 mL via OPHTHALMIC

## 2020-03-12 MED ORDER — EPINEPHRINE PF 1 MG/ML IJ SOLN
INTRAMUSCULAR | Status: AC
  Filled 2020-03-12: qty 1

## 2020-03-12 MED ORDER — NEOMYCIN-POLYMYXIN-DEXAMETH 3.5-10000-0.1 OP SUSP
OPHTHALMIC | Status: DC | PRN
  Administered 2020-03-12: 1 [drp] via OPHTHALMIC

## 2020-03-12 MED ORDER — PHENYLEPHRINE HCL 2.5 % OP SOLN
1.0000 [drp] | OPHTHALMIC | Status: AC | PRN
  Administered 2020-03-12 (×3): 1 [drp] via OPHTHALMIC

## 2020-03-12 MED ORDER — CYCLOPENTOLATE-PHENYLEPHRINE 0.2-1 % OP SOLN
1.0000 [drp] | OPHTHALMIC | Status: AC | PRN
  Administered 2020-03-12 (×3): 1 [drp] via OPHTHALMIC

## 2020-03-12 MED ORDER — PROVISC 10 MG/ML IO SOLN
INTRAOCULAR | Status: DC | PRN
  Administered 2020-03-12: 0.85 mL via INTRAOCULAR

## 2020-03-12 MED ORDER — LIDOCAINE HCL (PF) 1 % IJ SOLN
INTRAOCULAR | Status: DC | PRN
  Administered 2020-03-12: 1 mL via OPHTHALMIC

## 2020-03-12 MED ORDER — BSS IO SOLN
INTRAOCULAR | Status: DC | PRN
  Administered 2020-03-12: 15 mL via INTRAOCULAR

## 2020-03-12 MED ORDER — LIDOCAINE HCL 3.5 % OP GEL
1.0000 "application " | Freq: Once | OPHTHALMIC | Status: AC
  Administered 2020-03-12: 1 via OPHTHALMIC

## 2020-03-12 MED ORDER — SODIUM HYALURONATE 23 MG/ML IO SOLN
INTRAOCULAR | Status: DC | PRN
  Administered 2020-03-12: 0.6 mL via INTRAOCULAR

## 2020-03-12 MED ORDER — POVIDONE-IODINE 5 % OP SOLN
OPHTHALMIC | Status: DC | PRN
  Administered 2020-03-12: 1 via OPHTHALMIC

## 2020-03-12 SURGICAL SUPPLY — 14 items
CLOTH BEACON ORANGE TIMEOUT ST (SAFETY) ×2 IMPLANT
EYE SHIELD UNIVERSAL CLEAR (GAUZE/BANDAGES/DRESSINGS) ×3 IMPLANT
GLOVE BIOGEL PI IND STRL 7.0 (GLOVE) IMPLANT
GLOVE BIOGEL PI INDICATOR 7.0 (GLOVE) ×4
LENS ALC ACRYL/TECN (Ophthalmic Related) ×3 IMPLANT
NDL HYPO 18GX1.5 BLUNT FILL (NEEDLE) IMPLANT
NEEDLE HYPO 18GX1.5 BLUNT FILL (NEEDLE) ×3 IMPLANT
PAD ARMBOARD 7.5X6 YLW CONV (MISCELLANEOUS) ×2 IMPLANT
RING MALYGIN 7.0 (MISCELLANEOUS) ×2 IMPLANT
SYR TB 1ML LL NO SAFETY (SYRINGE) ×3 IMPLANT
TAPE SURG TRANSPORE 1 IN (GAUZE/BANDAGES/DRESSINGS) IMPLANT
TAPE SURGICAL TRANSPORE 1 IN (GAUZE/BANDAGES/DRESSINGS) ×3
VISCOELASTIC ADDITIONAL (OPHTHALMIC RELATED) ×3 IMPLANT
WATER STERILE IRR 250ML POUR (IV SOLUTION) ×3 IMPLANT

## 2020-03-12 NOTE — Discharge Instructions (Addendum)
Please discharge patient when stable, will follow up today with Dr. Wrzosek at the [facility] [place] office immediately following discharge.  Leave shield in place until visit.  All paperwork with discharge instructions will be given at the office.  [facility] Address:  [address]  [place], [place] [address]  

## 2020-03-12 NOTE — Addendum Note (Signed)
Addendum  created 03/12/20 1444 by Raenette Rover, CRNA   Charge Capture section accepted

## 2020-03-12 NOTE — Transfer of Care (Signed)
Immediate Anesthesia Transfer of Care Note  Patient: Bryan Mans Bardin Sr.  Procedure(s) Performed: CATARACT EXTRACTION PHACO AND INTRAOCULAR LENS PLACEMENT RIGHT EYE (Right Eye)  Patient Location: PACU  Anesthesia Type:MAC  Level of Consciousness: awake, alert , oriented and patient cooperative  Airway & Oxygen Therapy: Patient Spontanous Breathing  Post-op Assessment: Report given to RN and Post -op Vital signs reviewed and stable  Post vital signs: Reviewed and stable  Last Vitals: see phase 2 PACU VS; VSS Vitals Value Taken Time  BP    Temp    Pulse    Resp    SpO2      Last Pain:  Vitals:   03/12/20 0915  PainSc: 0-No pain         Complications: No complications documented.

## 2020-03-12 NOTE — Anesthesia Preprocedure Evaluation (Signed)
Anesthesia Evaluation  Patient identified by MRN, date of birth, ID band Patient awake    Reviewed: Allergy & Precautions, H&P , NPO status , Patient's Chart, lab work & pertinent test results, reviewed documented beta blocker date and time   Airway Mallampati: II  TM Distance: >3 FB Neck ROM: full    Dental no notable dental hx. (+) Teeth Intact   Pulmonary neg pulmonary ROS,    Pulmonary exam normal breath sounds clear to auscultation       Cardiovascular Exercise Tolerance: Good negative cardio ROS   Rhythm:regular Rate:Normal     Neuro/Psych negative neurological ROS  negative psych ROS   GI/Hepatic Neg liver ROS, GERD  Medicated,  Endo/Other  negative endocrine ROS  Renal/GU negative Renal ROS  negative genitourinary   Musculoskeletal   Abdominal   Peds  Hematology  (+) Blood dyscrasia, anemia ,   Anesthesia Other Findings   Reproductive/Obstetrics negative OB ROS                             Anesthesia Physical Anesthesia Plan  ASA: II  Anesthesia Plan: MAC   Post-op Pain Management:    Induction:   PONV Risk Score and Plan:   Airway Management Planned:   Additional Equipment:   Intra-op Plan:   Post-operative Plan:   Informed Consent: I have reviewed the patients History and Physical, chart, labs and discussed the procedure including the risks, benefits and alternatives for the proposed anesthesia with the patient or authorized representative who has indicated his/her understanding and acceptance.     Dental Advisory Given  Plan Discussed with: CRNA  Anesthesia Plan Comments:         Anesthesia Quick Evaluation

## 2020-03-12 NOTE — Op Note (Signed)
Date of procedure: 03/12/20  Pre-operative diagnosis: Visually significant combine form cataract, Right Eye; Poor Dilation, Right Eye (H25.811; H21.81)  Post-operative diagnosis: Visually significant cataract, Right Eye; Intra-operative Floppy Iris Syndrome, Right Eye  Procedure: Removal of cataract via phacoemulsification and insertion of intra-ocular lens [employer] [id number]  +24.0D into the capsular bag of the Right Eye (CPT [phone])  Attending surgeon: Gerda Diss. Dala Breault, MD, MA  Anesthesia: MAC, Topical Akten  Complications: None  Estimated Blood Loss: <60m (minimal)  Specimens: None  Implants: As above  Indications:  Visually significant cataract, Right Eye  Procedure:  The patient was seen and identified in the pre-operative area. The operative eye was identified and dilated.  The operative eye was marked.  Topical anesthesia was administered to the operative eye.     The patient was then to the operative suite and placed in the supine position.  A timeout was performed confirming the patient, procedure to be performed, and all other relevant information.   The patient's face was prepped and draped in the usual fashion for intra-ocular surgery.  A lid speculum was placed into the operative eye and the surgical microscope moved into place and focused.  Poor dilation of the iris was confirmed.  A superotemporal paracentesis was created using a 20 gauge paracentesis blade.  Shugarcaine was injected into the anterior chamber.  Viscoelastic was injected into the anterior chamber.  A temporal clear-corneal main wound incision was created using a 2.415mmicrokeratome.  A Malyugin ring was placed.  A continuous curvilinear capsulorrhexis was initiated using an irrigating cystitome and completed using capsulorrhexis forceps.  Hydrodissection and hydrodeliniation were performed.  Viscoelastic was injected into the anterior chamber.  A phacoemulsification handpiece and a chopper as a  second instrument were used to remove the nucleus and epinucleus. The irrigation/aspiration handpiece was used to remove any remaining cortical material.   The capsular bag was reinflated with viscoelastic, checked, and found to be intact.  The intraocular lens was inserted into the capsular bag and dialed into place using a Kuglen hook.  The Malyugin ring was removed.  The irrigation/aspiration handpiece was used to remove any remaining viscoelastic.  The clear corneal wound and paracentesis wounds were then hydrated and checked with Weck-Cels to be watertight.  The lid-speculum and drape was removed, and the patient's face was cleaned with a wet and dry 4x4.  Maxitrol was instilled in the eye before a clear shield was taped over the eye. The patient was taken to the post-operative care unit in good condition, having tolerated the procedure well.  Post-Op Instructions: [facility] and will receive all other orders and instructions.

## 2020-03-12 NOTE — Interval H&P Note (Signed)
History and Physical Interval Note:  03/12/2020 9:49 AM  Bryan Maldonado Sr.  has presented today for surgery, with the diagnosis of Nuclear sclerotic cataract - Right eye.  The various methods of treatment have been discussed with the patient and family. After consideration of risks, benefits and other options for treatment, the patient has consented to  Procedure(s) with comments: CATARACT EXTRACTION PHACO AND INTRAOCULAR LENS PLACEMENT (IOC) (Right) - right as a surgical intervention.  The patient's history has been reviewed, patient examined, no change in status, stable for surgery.  I have reviewed the patient's chart and labs.  Questions were answered to the patient's satisfaction.     Baruch Goldmann

## 2020-03-12 NOTE — Anesthesia Postprocedure Evaluation (Signed)
Anesthesia Post Note  Patient: Bryan Maldonado.  Procedure(s) Performed: CATARACT EXTRACTION PHACO AND INTRAOCULAR LENS PLACEMENT RIGHT EYE (Right Eye)  Patient location during evaluation: Phase II Anesthesia Type: MAC Level of consciousness: awake, awake and alert, oriented and patient cooperative Pain management: pain level controlled Vital Signs Assessment: post-procedure vital signs reviewed and stable Respiratory status: spontaneous breathing and respiratory function stable Cardiovascular status: blood pressure returned to baseline and stable Anesthetic complications: no   No complications documented.   Last Vitals:  Vitals:   03/12/20 0915 03/12/20 0930  BP: (!) 161/66   Pulse: (!) 54 (!) 50  Resp: 19 13  SpO2: 98% 98%    Last Pain:  Vitals:   03/12/20 0915  PainSc: 0-No pain                 Delcia Spitzley L

## 2020-03-13 ENCOUNTER — Encounter (HOSPITAL_COMMUNITY): Payer: Self-pay | Admitting: Ophthalmology

## 2020-03-19 DIAGNOSIS — Z9841 Cataract extraction status, right eye: Secondary | ICD-10-CM | POA: Diagnosis not present

## 2020-03-19 DIAGNOSIS — H25812 Combined forms of age-related cataract, left eye: Secondary | ICD-10-CM | POA: Diagnosis not present

## 2020-03-19 DIAGNOSIS — H25813 Combined forms of age-related cataract, bilateral: Secondary | ICD-10-CM | POA: Diagnosis not present

## 2020-03-20 ENCOUNTER — Other Ambulatory Visit: Payer: Self-pay

## 2020-03-20 ENCOUNTER — Encounter (HOSPITAL_COMMUNITY)
Admission: RE | Admit: 2020-03-20 | Discharge: 2020-03-20 | Disposition: A | Discharge status: Home / Self Care | Payer: PPO | Source: Ambulatory Visit | Attending: Ophthalmology | Admitting: Ophthalmology

## 2020-03-20 NOTE — H&P (Signed)
Surgical History & Physical  Patient Name: Bryan Maldonado DOB: 06-08-1941  Surgery: Cataract extraction with intraocular lens implant phacoemulsification; Left Eye  Surgeon: Baruch Goldmann MD Surgery Date:  03/26/2020 Pre-Op Date:  03/19/2020  HPI: A 46 Yr. old male patient 1. 1. The patient is returning after cataract post-op. The right eye is affected. Since the last visit, the affected area feels improvement and is doing well. The condition's severity is none. Patient is following medication instructions. PT reports he is now ready to have his left eye done. PT states he has noticed things appear to be less bright in the left eye. He is amblyopic in the left eye so he states vision has never been good. Pt feels this is negatively negatively affecting the patient's quality of life. HPI was performed by Baruch Goldmann .  Medical History: Dry Eyes Cataracts Strabismus: Esotropia OS Arthritis Cancer  Review of Systems Negative Allergic/Immunologic Negative Cardiovascular Negative Constitutional Negative Ear, Nose, Mouth & Throat Negative Endocrine Negative Eyes Negative Gastrointestinal Negative Genitourinary Negative Hemotologic/Lymphatic Negative Integumentary Negative Musculoskeletal Negative Neurological Negative Psychiatry Negative Respiratory  Social   Never smoked    Medication Artifical Tears,  Acetaminophinen-codeine, Atorvastatin, Calcium, Cetirizine Fluoxetine, Folic acid, Durosemide, Methotrexate, Omeprazole, Potassium chloride, Salmon oil, Tamsulosin, Zinc, Vitamin C,   Sx/Procedures Phaco c IOL,  Knee replacement,   Drug Allergies  Morphine,   History & Physical: Heent:  Cataract, left eye NECK: supple without bruits LUNGS: lungs clear to auscultation CV: regular rate and rhythm Abdomen: soft and non-tender  Impression & Plan: Assessment: 1.  CATARACT EXTRACTION STATUS; Right Eye (Z98.41) 2.  COMBINED FORMS AGE RELATED CATARACT; Both Eyes  (H25.813)  Plan: 1.  1 week after cataract surgery. Doing well with improved vision and normal eye pressure. Call with any problems or concerns. Stop all drops. Call with any concerning symptoms. 2.  Dilates poorly - shugacaine by protocol. Malyugin Ring. Omidira. Flomax. Cataract accounts for the patient's decreased vision. This visual impairment is not correctable with a tolerable change in glasses or contact lenses. Cataract surgery with an implantation of a new lens should significantly improve the visual and functional status of the patient. Discussed all risks, benefits, alternatives, and potential complications. Discussed the procedures and recovery. Patient desires to have surgery. A-scan ordered and performed today for intra-ocular lens calculations. The surgery will be performed in order to improve vision for driving, reading, and for eye examinations. Recommend phacoemulsification with intra-ocular lens. Left Eye. Surgery required to correct imbalance of vision.

## 2020-03-21 ENCOUNTER — Other Ambulatory Visit: Payer: Self-pay

## 2020-03-21 NOTE — Progress Notes (Signed)
No Covid screening needed, patient didn't have to have one for his first eye. This will be his 2nd eye surgery. Per Herold Harms was with a patient at the time.

## 2020-03-23 ENCOUNTER — Inpatient Hospital Stay (HOSPITAL_COMMUNITY): Admission: RE | Admit: 2020-03-23 | Payer: PPO | Source: Ambulatory Visit

## 2020-03-23 ENCOUNTER — Other Ambulatory Visit (HOSPITAL_COMMUNITY)
Admission: RE | Admit: 2020-03-23 | Discharge: 2020-03-23 | Disposition: A | Discharge status: Home / Self Care | Payer: PPO | Source: Ambulatory Visit | Attending: Ophthalmology | Admitting: Ophthalmology

## 2020-03-23 ENCOUNTER — Other Ambulatory Visit: Payer: Self-pay

## 2020-03-23 NOTE — Patient Instructions (Signed)
Bryan Mans Gillie Sr.  03/23/2020     @PREFPERIOPPHARMACY @   Your procedure is scheduled on  03/26/2020.  Report to [facility] at 1130  A.M.  Call this number if you have problems the morning of surgery:  [phone]   Remember:  Do not eat or drink after midnight.                        Take these medicines the morning of surgery with A SIP OF WATER zyrtec, prozac, prilosec, flomax.    Do not wear jewelry, make-up or nail polish.  Do not wear lotions, powders, or perfumes. Please wear deodorant and brush your teeth.  Do not shave 48 hours prior to surgery.  Men may shave face and neck.  Do not bring valuables to the hospital.  [facility] is not responsible for any belongings or valuables.  Contacts, dentures or bridgework may not be worn into surgery.  Leave your suitcase in the car.  After surgery it may be brought to your room.  For patients admitted to the hospital, discharge time will be determined by your treatment team.  Patients discharged the day of surgery will not be allowed to drive home.   Name and phone number of your driver:   wife Special instructions:  DO NOT smoke the morning of your procedure.  Please read over the following fact sheets that you were given. Anesthesia Post-op Instructions and Care and Recovery After Surgery      Cataract Surgery, Care After This sheet gives you information about how to care for yourself after your procedure. Your health care provider may also give you more specific instructions. If you have problems or questions, contact your health care provider. What can I expect after the procedure? After the procedure, it is common to have:  Itching.  Discomfort.  Fluid discharge.  Sensitivity to light and to touch.  Bruising in or around the eye.  Mild blurred vision. Follow these instructions at home: Eye care   Do not touch or rub your eyes.  Protect your eyes as told by your health care provider. You may be  told to wear a protective eye shield or sunglasses.  Do not put a contact lens into the affected eye or eyes until your health care provider approves.  Keep the area around your eye clean and dry: ? Avoid swimming. ? Do not allow water to hit you directly in the face while showering. ? Keep soap and shampoo out of your eyes.  Check your eye every day for signs of infection. Watch for: ? Redness, swelling, or pain. ? Fluid, blood, or pus. ? Warmth. ? A bad smell. ? Vision that is getting worse. ? Sensitivity that is getting worse. Activity  Do not drive for 24 hours if you were given a sedative during your procedure.  Avoid strenuous activities, such as playing contact sports, for as long as told by your health care provider.  Do not drive or use heavy machinery until your health care provider approves.  Do not bend or lift heavy objects. Bending increases pressure in the eye. You can walk, climb stairs, and do light household chores.  Ask your health care provider when you can return to work. If you work in a dusty environment, you may be advised to wear protective eyewear for a period of time. General instructions  Take or apply over-the-counter and prescription medicines only as told by  your health care provider. This includes eye drops.  Keep all follow-up visits as told by your health care provider. This is important. Contact a health care provider if:  You have increased bruising around your eye.  You have pain that is not helped with medicine.  You have a fever.  You have redness, swelling, or pain in your eye.  You have fluid, blood, or pus coming from your incision.  Your vision gets worse.  Your sensitivity to light gets worse. Get help right away if:  You have sudden loss of vision.  You see flashes of light or spots (floaters).  You have severe eye pain.  You develop nausea or vomiting. Summary  After your procedure, it is common to have itching,  discomfort, bruising, fluid discharge, or sensitivity to light.  Follow instructions from your health care provider about caring for your eye after the procedure.  Do not rub your eye after the procedure. You may need to wear eye protection or sunglasses. Do not wear contact lenses. Keep the area around your eye clean and dry.  Avoid activities that require a lot of effort. These include playing sports and lifting heavy objects.  Contact a health care provider if you have increased bruising, pain that does not go away, or a fever. Get help right away if you suddenly lose your vision, see flashes of light or spots, or have severe pain in the eye. This information is not intended to replace advice given to you by your health care provider. Make sure you discuss any questions you have with your health care provider. Document Revised: 06/14/2019 Document Reviewed: 02/15/2018 Elsevier Patient Education  [place].

## 2020-03-26 ENCOUNTER — Ambulatory Visit (HOSPITAL_COMMUNITY): Payer: PPO | Admitting: Anesthesiology

## 2020-03-26 ENCOUNTER — Encounter (HOSPITAL_COMMUNITY)
Admission: RE | Disposition: A | Discharge status: Home / Self Care | Payer: Self-pay | Source: Home / Self Care | Attending: Ophthalmology

## 2020-03-26 ENCOUNTER — Ambulatory Visit (HOSPITAL_COMMUNITY)
Admission: RE | Admit: 2020-03-26 | Discharge: 2020-03-26 | Disposition: A | Discharge status: Home / Self Care | Payer: PPO | Attending: Ophthalmology | Admitting: Ophthalmology

## 2020-03-26 DIAGNOSIS — K219 Gastro-esophageal reflux disease without esophagitis: Secondary | ICD-10-CM | POA: Diagnosis not present

## 2020-03-26 DIAGNOSIS — H25812 Combined forms of age-related cataract, left eye: Secondary | ICD-10-CM | POA: Insufficient documentation

## 2020-03-26 DIAGNOSIS — Z79899 Other long term (current) drug therapy: Secondary | ICD-10-CM | POA: Diagnosis not present

## 2020-03-26 DIAGNOSIS — M069 Rheumatoid arthritis, unspecified: Secondary | ICD-10-CM | POA: Diagnosis not present

## 2020-03-26 DIAGNOSIS — H2181 Floppy iris syndrome: Secondary | ICD-10-CM | POA: Insufficient documentation

## 2020-03-26 DIAGNOSIS — Z9841 Cataract extraction status, right eye: Secondary | ICD-10-CM | POA: Insufficient documentation

## 2020-03-26 DIAGNOSIS — Z885 Allergy status to narcotic agent status: Secondary | ICD-10-CM | POA: Diagnosis not present

## 2020-03-26 DIAGNOSIS — M1990 Unspecified osteoarthritis, unspecified site: Secondary | ICD-10-CM | POA: Diagnosis not present

## 2020-03-26 HISTORY — PX: CATARACT EXTRACTION W/PHACO: SHX586

## 2020-03-26 SURGERY — PHACOEMULSIFICATION, CATARACT, WITH IOL INSERTION
Anesthesia: Monitor Anesthesia Care | Site: Eye | Laterality: Left

## 2020-03-26 MED ORDER — TETRACAINE HCL 0.5 % OP SOLN
1.0000 [drp] | OPHTHALMIC | Status: AC | PRN
  Administered 2020-03-26 (×3): 1 [drp] via OPHTHALMIC

## 2020-03-26 MED ORDER — NEOMYCIN-POLYMYXIN-DEXAMETH 3.5-10000-0.1 OP SUSP
OPHTHALMIC | Status: DC | PRN
  Administered 2020-03-26: 1 [drp] via OPHTHALMIC

## 2020-03-26 MED ORDER — LIDOCAINE HCL (PF) 1 % IJ SOLN
INTRAOCULAR | Status: DC | PRN
  Administered 2020-03-26: 1 mL via OPHTHALMIC

## 2020-03-26 MED ORDER — CYCLOPENTOLATE-PHENYLEPHRINE 0.2-1 % OP SOLN
1.0000 [drp] | OPHTHALMIC | Status: AC | PRN
  Administered 2020-03-26 (×3): 1 [drp] via OPHTHALMIC

## 2020-03-26 MED ORDER — LIDOCAINE HCL 3.5 % OP GEL
1.0000 "application " | Freq: Once | OPHTHALMIC | Status: AC
  Administered 2020-03-26: 1 via OPHTHALMIC

## 2020-03-26 MED ORDER — SODIUM HYALURONATE 23 MG/ML IO SOLN
INTRAOCULAR | Status: DC | PRN
  Administered 2020-03-26: 0.6 mL via INTRAOCULAR

## 2020-03-26 MED ORDER — PHENYLEPHRINE-KETOROLAC 1-0.3 % IO SOLN
INTRAOCULAR | Status: DC | PRN
  Administered 2020-03-26: 500 mL via OPHTHALMIC

## 2020-03-26 MED ORDER — PROVISC 10 MG/ML IO SOLN
INTRAOCULAR | Status: DC | PRN
  Administered 2020-03-26: 0.85 mL via INTRAOCULAR

## 2020-03-26 MED ORDER — PHENYLEPHRINE-KETOROLAC 1-0.3 % IO SOLN
INTRAOCULAR | Status: AC
  Filled 2020-03-26: qty 4

## 2020-03-26 MED ORDER — BSS IO SOLN
INTRAOCULAR | Status: DC | PRN
  Administered 2020-03-26: 15 mL via INTRAOCULAR

## 2020-03-26 MED ORDER — PHENYLEPHRINE HCL 2.5 % OP SOLN
1.0000 [drp] | OPHTHALMIC | Status: AC | PRN
  Administered 2020-03-26 (×3): 1 [drp] via OPHTHALMIC

## 2020-03-26 MED ORDER — POVIDONE-IODINE 5 % OP SOLN
OPHTHALMIC | Status: DC | PRN
  Administered 2020-03-26: 1 via OPHTHALMIC

## 2020-03-26 SURGICAL SUPPLY — 14 items
CLOTH BEACON ORANGE TIMEOUT ST (SAFETY) ×3 IMPLANT
EYE SHIELD UNIVERSAL CLEAR (GAUZE/BANDAGES/DRESSINGS) ×3 IMPLANT
GLOVE BIOGEL PI IND STRL 7.0 (GLOVE) ×2 IMPLANT
GLOVE BIOGEL PI INDICATOR 7.0 (GLOVE) ×4
LENS ALC ACRYL/TECN (Ophthalmic Related) ×2 IMPLANT
NDL HYPO 18GX1.5 BLUNT FILL (NEEDLE) IMPLANT
NEEDLE HYPO 18GX1.5 BLUNT FILL (NEEDLE) ×3 IMPLANT
PAD ARMBOARD 7.5X6 YLW CONV (MISCELLANEOUS) ×2 IMPLANT
RING MALYGIN 7.0 (MISCELLANEOUS) ×3 IMPLANT
SYR TB 1ML LL NO SAFETY (SYRINGE) ×2 IMPLANT
TAPE SURG TRANSPORE 1 IN (GAUZE/BANDAGES/DRESSINGS) IMPLANT
TAPE SURGICAL TRANSPORE 1 IN (GAUZE/BANDAGES/DRESSINGS) ×3
VISCOELASTIC ADDITIONAL (OPHTHALMIC RELATED) ×3 IMPLANT
WATER STERILE IRR 250ML POUR (IV SOLUTION) ×2 IMPLANT

## 2020-03-26 NOTE — Anesthesia Preprocedure Evaluation (Addendum)
Anesthesia Evaluation  Patient identified by MRN, date of birth, ID band Patient awake    Reviewed: Allergy & Precautions, H&P , NPO status , Patient's Chart, lab work & pertinent test results, reviewed documented beta blocker date and time   Airway Mallampati: II  TM Distance: >3 FB Neck ROM: full    Dental  (+) Upper Dentures, Lower Dentures   Pulmonary neg pulmonary ROS,    Pulmonary exam normal breath sounds clear to auscultation       Cardiovascular Exercise Tolerance: Good negative cardio ROS   Rhythm:regular Rate:Bradycardia     Neuro/Psych negative neurological ROS  negative psych ROS   GI/Hepatic Neg liver ROS, GERD  Medicated,  Endo/Other  negative endocrine ROS  Renal/GU negative Renal ROS  negative genitourinary   Musculoskeletal  (+) Arthritis , Rheumatoid disorders,    Abdominal   Peds  Hematology  (+) Blood dyscrasia, anemia ,   Anesthesia Other Findings   Reproductive/Obstetrics negative OB ROS                            Anesthesia Physical  Anesthesia Plan  ASA: II  Anesthesia Plan: MAC   Post-op Pain Management:    Induction:   PONV Risk Score and Plan:   Airway Management Planned: Nasal Cannula and Natural Airway  Additional Equipment:   Intra-op Plan:   Post-operative Plan:   Informed Consent: I have reviewed the patients History and Physical, chart, labs and discussed the procedure including the risks, benefits and alternatives for the proposed anesthesia with the patient or authorized representative who has indicated his/her understanding and acceptance.     Dental Advisory Given and Dental advisory given  Plan Discussed with: CRNA and Surgeon  Anesthesia Plan Comments:        Anesthesia Quick Evaluation

## 2020-03-26 NOTE — Op Note (Signed)
Date of procedure: 03/26/20  Pre-operative diagnosis: Visually significant combined-form age-related cataract, Left Eye; Poor dilation, Left eye (H25.?2; H21.81)  Post-operative diagnosis: Visually significant cataract, Left Eye; Intra-operative Floppy Iris Syndrome, Left Eye  Procedure: Complex removal of cataract via phacoemulsification and insertion of intra-ocular lens [employer] [id number]  +21.0D into the capsular bag of the Left Eye (CPT [phone])  Attending surgeon: Gerda Diss. Mehkai Gallo, MD, MA  Anesthesia: MAC, Topical Akten  Complications: None  Estimated Blood Loss: <21m (minimal)  Specimens: None  Implants: As above  Indications:  Visually significant cataract, Left Eye  Procedure:  The patient was seen and identified in the pre-operative area. The operative eye was identified and dilated.  The operative eye was marked.  Topical anesthesia was administered to the operative eye.     The patient was then to the operative suite and placed in the supine position.  A timeout was performed confirming the patient, procedure to be performed, and all other relevant information.   The patient's face was prepped and draped in the usual fashion for intra-ocular surgery.  A lid speculum was placed into the operative eye and the surgical microscope moved into place and focused.  Poor dilation of the iris was confirmed.  An inferotemporal paracentesis was created using a 20 gauge paracentesis blade.  Shugarcaine was injected into the anterior chamber.  Viscoelastic was injected into the anterior chamber.  A temporal clear-corneal main wound incision was created using a 2.444mmicrokeratome.  A Malyugin ring was placed.  A continuous curvilinear capsulorrhexis was initiated using an irrigating cystitome and completed using capsulorrhexis forceps.  Hydrodissection and hydrodeliniation were performed.  Viscoelastic was injected into the anterior chamber.  A phacoemulsification handpiece and a  chopper as a second instrument were used to remove the nucleus and epinucleus. The irrigation/aspiration handpiece was used to remove any remaining cortical material.   The capsular bag was reinflated with viscoelastic, checked, and found to be intact.  The intraocular lens was inserted into the capsular bag and dialed into place using a Kuglen hook. The Malyugin ring was removed.  The irrigation/aspiration handpiece was used to remove any remaining viscoelastic.  The clear corneal wound and paracentesis wounds were then hydrated and checked with Weck-Cels to be watertight.  The lid-speculum and drape was removed, and the patient's face was cleaned with a wet and dry 4x4.  Maxitrol was instilled in the eye before a clear shield was taped over the eye. The patient was taken to the post-operative care unit in good condition, having tolerated the procedure well.  Post-Op Instructions: [facility] and will receive all other orders and instructions.

## 2020-03-26 NOTE — Anesthesia Postprocedure Evaluation (Signed)
Anesthesia Post Note  Patient: Bryan Mans Hernandez Sr.  Procedure(s) Performed: CATARACT EXTRACTION PHACO AND INTRAOCULAR LENS PLACEMENT (IOC) (Left Eye)  Patient location during evaluation: PACU Anesthesia Type: MAC Level of consciousness: awake, oriented, awake and alert and patient cooperative Pain management: satisfactory to patient Vital Signs Assessment: post-procedure vital signs reviewed and stable Respiratory status: spontaneous breathing, respiratory function stable and nonlabored ventilation Cardiovascular status: stable Postop Assessment: no apparent nausea or vomiting Anesthetic complications: no   No complications documented.   Last Vitals:  Vitals:   03/26/20 1235  BP: (!) 165/71  Pulse: 48  Resp: (!) 8  Temp: 36.6 C  SpO2: 99%    Last Pain:  Vitals:   03/26/20 1235  PainSc: 0-No pain                 Olivine Hiers

## 2020-03-26 NOTE — Transfer of Care (Signed)
Immediate Anesthesia Transfer of Care Note  Patient: Bryan Maldonado.  Procedure(s) Performed: CATARACT EXTRACTION PHACO AND INTRAOCULAR LENS PLACEMENT (IOC) (Left Eye)  Patient Location: PACU  Anesthesia Type:MAC  Level of Consciousness: awake, alert , oriented and patient cooperative  Airway & Oxygen Therapy: Patient Spontanous Breathing  Post-op Assessment: Report given to RN, Post -op Vital signs reviewed and stable and Patient moving all extremities X 4  Post vital signs: Reviewed and stable  Last Vitals:  Vitals Value Taken Time  BP    Temp    Pulse    Resp    SpO2      Last Pain:  Vitals:   03/26/20 1235  PainSc: 0-No pain         Complications: No complications documented.

## 2020-03-26 NOTE — Interval H&P Note (Signed)
History and Physical Interval Note:  03/26/2020 2:06 PM  Bryan Copa A Black Sr.  has presented today for surgery, with the diagnosis of Nuclear sclerotic cataract - Left eye.  The various methods of treatment have been discussed with the patient and family. After consideration of risks, benefits and other options for treatment, the patient has consented to  Procedure(s) with comments: CATARACT EXTRACTION PHACO AND INTRAOCULAR LENS PLACEMENT ([place]) (Left) - left as a surgical intervention.  The patient's history has been reviewed, patient examined, no change in status, stable for surgery.  I have reviewed the patient's chart and labs.  Questions were answered to the patient's satisfaction.     Baruch Goldmann

## 2020-03-26 NOTE — Discharge Instructions (Signed)
Please discharge patient when stable, will follow up today with Dr. Makael Stein at the [facility] [place] office immediately following discharge.  Leave shield in place until visit.  All paperwork with discharge instructions will be given at the office.  [facility] Address:  [address]  , [place] [address]  

## 2020-03-27 ENCOUNTER — Encounter (HOSPITAL_COMMUNITY): Payer: Self-pay | Admitting: Ophthalmology

## 2020-04-12 ENCOUNTER — Other Ambulatory Visit: Payer: Self-pay

## 2020-04-12 ENCOUNTER — Ambulatory Visit (INDEPENDENT_AMBULATORY_CARE_PROVIDER_SITE_OTHER): Payer: PPO | Admitting: Family Medicine

## 2020-04-12 ENCOUNTER — Encounter: Payer: Self-pay | Admitting: Family Medicine

## 2020-04-12 VITALS — BP 126/82 | Temp 97.6°F | Ht 67.5 in | Wt 173.4 lb

## 2020-04-12 DIAGNOSIS — E782 Mixed hyperlipidemia: Secondary | ICD-10-CM | POA: Diagnosis not present

## 2020-04-12 DIAGNOSIS — R7303 Prediabetes: Secondary | ICD-10-CM

## 2020-04-12 DIAGNOSIS — Z79899 Other long term (current) drug therapy: Secondary | ICD-10-CM

## 2020-04-12 MED ORDER — OMEPRAZOLE 20 MG PO CPDR: 20.0000 mg | DELAYED_RELEASE_CAPSULE | Freq: Every day | ORAL | 1 refills | Status: DC

## 2020-04-12 MED ORDER — FLUOXETINE HCL 10 MG PO CAPS: 10.0000 mg | ORAL_CAPSULE | Freq: Every day | ORAL | 4 refills | Status: DC

## 2020-04-12 MED ORDER — ATORVASTATIN CALCIUM 10 MG PO TABS: 10.0000 mg | ORAL_TABLET | Freq: Every day | ORAL | 1 refills | Status: DC

## 2020-04-12 MED ORDER — POTASSIUM CHLORIDE CRYS ER 20 MEQ PO TBCR: EXTENDED_RELEASE_TABLET | ORAL | 5 refills | Status: DC

## 2020-04-12 MED ORDER — FUROSEMIDE 20 MG PO TABS: 20.0000 mg | ORAL_TABLET | ORAL | 1 refills | Status: DC | PRN

## 2020-04-12 NOTE — Progress Notes (Signed)
   Subjective:    Patient ID: Bryan Homans Sr., male    DOB: 1940/10/09, 79 y.o.   MRN: [id number]  Hyperlipidemia This is a chronic problem. The current episode started more than 1 year ago. Pertinent negatives include no chest pain or shortness of breath. Treatments tried: lipitor. There are no compliance problems.  Risk factors for coronary artery disease include dyslipidemia.   Prediabetes - Plan: Hemoglobin A1c  Mixed hyperlipidemia - Plan: Basic metabolic panel  High risk medication use - Plan: Basic metabolic panel     Review of Systems  Constitutional: Negative for diaphoresis and fatigue.  HENT: Negative for congestion and rhinorrhea.   Respiratory: Negative for cough and shortness of breath.   Cardiovascular: Negative for chest pain and leg swelling.  Gastrointestinal: Negative for abdominal pain and diarrhea.  Skin: Negative for color change and rash.  Neurological: Negative for dizziness and headaches.  Psychiatric/Behavioral: Negative for behavioral problems and confusion.       Objective:   Physical Exam Vitals reviewed.  Constitutional:      General: He is not in acute distress. HENT:     Head: Normocephalic and atraumatic.  Eyes:     General:        Right eye: No discharge.        Left eye: No discharge.  Neck:     Trachea: No tracheal deviation.  Cardiovascular:     Rate and Rhythm: Normal rate and regular rhythm.     Heart sounds: Normal heart sounds. No murmur heard.   Pulmonary:     Effort: Pulmonary effort is normal. No respiratory distress.     Breath sounds: Normal breath sounds.  Lymphadenopathy:     Cervical: No cervical adenopathy.  Skin:    General: Skin is warm and dry.  Neurological:     Mental Status: He is alert.     Coordination: Coordination normal.  Psychiatric:        Behavior: Behavior normal.           Assessment & Plan:  1. Prediabetes A1c decent control watch diet stay active - Hemoglobin A1c  2. Mixed  hyperlipidemia Cholesterol profile recently looked good no need to repeat - Basic metabolic panel  3. High risk medication use Check kidney function based upon medication patient is taking - Basic metabolic panel Follow-up for comprehensive checkup and labs by March and wellness

## 2020-04-13 ENCOUNTER — Encounter: Payer: Self-pay | Admitting: Family Medicine

## 2020-04-13 LAB — BASIC METABOLIC PANEL
BUN/Creatinine Ratio: 23 (ref 10–24)
BUN: 19 mg/dL (ref 8–27)
CO2: 29 mmol/L (ref 20–29)
Calcium: 9.3 mg/dL (ref 8.6–10.2)
Chloride: 103 mmol/L (ref 96–106)
Creatinine, Ser: 0.81 mg/dL (ref 0.76–1.27)
GFR calc Af Amer: 98 mL/min/{1.73_m2} (ref >59)
GFR calc non Af Amer: 85 mL/min/{1.73_m2} (ref >59)
Glucose: 99 mg/dL (ref 65–99)
Potassium: 4.2 mmol/L (ref 3.5–5.2)
Sodium: 142 mmol/L (ref 134–144)

## 2020-04-13 LAB — HEMOGLOBIN A1C
Est. average glucose Bld gHb Est-mCnc: 128 mg/dL
Hgb A1c MFr Bld: 6.1 % — ABNORMAL HIGH (ref 4.8–5.6)

## 2020-04-18 DIAGNOSIS — Z85820 Personal history of malignant melanoma of skin: Secondary | ICD-10-CM | POA: Diagnosis not present

## 2020-04-18 DIAGNOSIS — D225 Melanocytic nevi of trunk: Secondary | ICD-10-CM | POA: Diagnosis not present

## 2020-04-18 DIAGNOSIS — D034 Melanoma in situ of scalp and neck: Secondary | ICD-10-CM | POA: Diagnosis not present

## 2020-04-18 DIAGNOSIS — Z08 Encounter for follow-up examination after completed treatment for malignant neoplasm: Secondary | ICD-10-CM | POA: Diagnosis not present

## 2020-04-18 DIAGNOSIS — Z1283 Encounter for screening for malignant neoplasm of skin: Secondary | ICD-10-CM | POA: Diagnosis not present

## 2020-05-08 DIAGNOSIS — R350 Frequency of micturition: Secondary | ICD-10-CM | POA: Diagnosis not present

## 2020-05-08 DIAGNOSIS — R972 Elevated prostate specific antigen [PSA]: Secondary | ICD-10-CM | POA: Diagnosis not present

## 2020-05-08 DIAGNOSIS — R351 Nocturia: Secondary | ICD-10-CM | POA: Diagnosis not present

## 2020-05-08 DIAGNOSIS — N401 Enlarged prostate with lower urinary tract symptoms: Secondary | ICD-10-CM | POA: Diagnosis not present

## 2020-05-11 DIAGNOSIS — D034 Melanoma in situ of scalp and neck: Secondary | ICD-10-CM | POA: Diagnosis not present

## 2020-05-23 NOTE — Progress Notes (Signed)
Office Visit Note  Patient: Bryan Hamme Cerezo Sr.             Date of Birth: 08/12/1941           MRN: [id number]             PCP: Kathyrn Drown, MD Referring: Kathyrn Drown, MD Visit Date: 06/06/2020 Occupation: @GUAROCC @  Subjective:  Medication Management (doing well overall)   History of Present Illness: Bryan SENGER Sr. is a 79 y.o. male with history of rheumatoid arthritis and osteoarthritis.  He has been doing well without any increased joint swelling.  He states he underwent bilateral cataract surgery and his vision has improved remarkably.  He has been wearing glasses to read for near vision.  His bilateral total knee replacements are doing well.  Activities of Daily Living:  Patient reports morning stiffness for 15 minutes.   Patient Denies nocturnal pain.  Difficulty dressing/grooming: Reports Difficulty climbing stairs: Reports Difficulty getting out of chair: Reports Difficulty using hands for taps, buttons, cutlery, and/or writing: Reports  Review of Systems  Constitutional: Negative for fatigue.  HENT: Positive for mouth dryness. Negative for mouth sores and nose dryness.   Eyes: Positive for dryness. Negative for pain, itching and visual disturbance.  Respiratory: Negative for cough, hemoptysis, shortness of breath and difficulty breathing.   Cardiovascular: Negative for chest pain, palpitations and swelling in legs/feet.  Gastrointestinal: Negative for abdominal pain, blood in stool, constipation and diarrhea.  Endocrine: Negative for increased urination.  Genitourinary: Negative for difficulty urinating and painful urination.  Musculoskeletal: Positive for arthralgias, joint pain, joint swelling, myalgias, morning stiffness and myalgias. Negative for muscle weakness and muscle tenderness.  Skin: Negative for color change, rash and redness.  Allergic/Immunologic: Negative for susceptible to infections.  Neurological: Negative for dizziness, headaches, memory  loss and weakness.  Hematological: Negative for swollen glands.  Psychiatric/Behavioral: Negative for confusion and sleep disturbance.    PMFS History:  Patient Active Problem List   Diagnosis Date Noted  . High risk medication use 09/19/2016  . History of total knee replacement, bilateral 09/19/2016  . Gastroesophageal reflux disease without esophagitis 09/19/2016  . History of melanoma 09/19/2016  . History of basal cell carcinoma 09/19/2016  . History of anemia 09/19/2016  . Allergic rhinitis 02/18/2016  . Melanoma of skin ([place]) 06/06/2015  . Prediabetes 11/20/2014  . Hyperlipidemia 11/20/2014  . BPH (benign prostatic hyperplasia) 05/22/2014  . Rheumatoid arthritis involving multiple sites with positive rheumatoid factor ([place]) 01/05/2013  . Chronic pain syndrome 01/05/2013    Past Medical History:  Diagnosis Date  . Anemia   . Basal cell carcinoma   . BPH (benign prostatic hyperplasia)   . GERD (gastroesophageal reflux disease)   . Melanoma ([place])   . Osteoarthritis   . Rheumatoid aortitis   . Rheumatoid arthritis ([place])     Family History  Problem Relation Age of Onset  . Rheum arthritis Mother   . Emphysema Father   . Cancer Brother   . Emphysema Sister    Past Surgical History:  Procedure Laterality Date  . CATARACT EXTRACTION W/PHACO Right 03/12/2020   Procedure: CATARACT EXTRACTION PHACO AND INTRAOCULAR LENS PLACEMENT RIGHT EYE;  Surgeon: Baruch Goldmann, MD;  Location: [facility];  Service: Ophthalmology;  Laterality: Right;  CDE: 9.99  . CATARACT EXTRACTION W/PHACO Left 03/26/2020   Procedure: CATARACT EXTRACTION PHACO AND INTRAOCULAR LENS PLACEMENT ([facility]);  Surgeon: Baruch Goldmann, MD;  Location: [facility];  Service: Ophthalmology;  Laterality: Left;  CDE: 9.16   . COLONOSCOPY    . HERNIA REPAIR    . JOINT REPLACEMENT Bilateral   . KNEE ARTHROPLASTY    . MELANOMA EXCISION    . MELANOMA EXCISION  09/22/2018   on head   Social History   Social History Narrative    . Not on file   Immunization History  Administered Date(s) Administered  . Influenza Split 05/19/2013  . Influenza, High Dose Seasonal PF 05/20/2019  . Influenza,inj,Quad PF,6+ Mos 05/22/2014, 05/22/2015, 06/06/2016, 06/10/2017, 05/14/2018  . Influenza-Unspecified 05/20/2019  . PFIZER SARS-COV-2 Vaccination 11/14/2019, 12/05/2019  . Pneumococcal Conjugate-13 05/22/2014  . Pneumococcal Polysaccharide-23 05/19/2013  . Zoster Recombinat (Shingrix) 09/06/2018, 03/11/2019     Objective: Vital Signs: BP 104/66 (BP Location: Left Arm, Patient Position: Sitting, Cuff Size: Small)   Pulse 69   Ht 5' 7.5" (1.715 m)   Wt 181 lb (82.1 kg)   BMI 27.93 kg/m    Physical Exam Vitals and nursing note reviewed.  Constitutional:      Appearance: He is well-developed.  HENT:     Head: Normocephalic and atraumatic.  Eyes:     Conjunctiva/sclera: Conjunctivae normal.     Pupils: Pupils are equal, round, and reactive to light.  Cardiovascular:     Rate and Rhythm: Normal rate and regular rhythm.     Heart sounds: Normal heart sounds.  Pulmonary:     Effort: Pulmonary effort is normal.     Breath sounds: Normal breath sounds.  Abdominal:     General: Bowel sounds are normal.     Palpations: Abdomen is soft.  Musculoskeletal:     Cervical back: Normal range of motion and neck supple.  Skin:    General: Skin is warm and dry.     Capillary Refill: Capillary refill takes less than 2 seconds.  Neurological:     Mental Status: He is alert and oriented to person, place, and time.  Psychiatric:        Behavior: Behavior normal.      Musculoskeletal Exam: He has limited range of motion of cervical spine.  Shoulder abduction was limited to 90 degrees.  He has contracture in his elbows.  He has limited range of motion of bilateral wrist joints.  Mild flexor tenosynovitis was and tenderness was noted.  He has severe rheumatoid arthritis with contractures and MCPs and PIPs.  Synovial thickening over  MCPs and PIPs was noted.  Hip joints with limited range of motion.  Knee joints with good range of motion.  He has limited range of motion of ankle joints.  No tenderness was noted over ankle joints or MTPs.  CDAI Exam: CDAI Score: 2.8  Patient Global: 4 mm; Provider Global: 4 mm Swollen: 0 ; Tender: 2  Joint Exam 06/06/2020      Right  Left  Wrist   Tender   Tender     Investigation: No additional findings.  Imaging: No results found.  Recent Labs: Lab Results  Component Value Date   WBC 5.6 03/06/2020   HGB 11.5 (L) 03/06/2020   PLT 165 03/06/2020   NA 142 04/12/2020   K 4.2 04/12/2020   CL 103 04/12/2020   CO2 29 04/12/2020   GLUCOSE 99 04/12/2020   BUN 19 04/12/2020   CREATININE 0.81 04/12/2020   BILITOT 0.7 03/06/2020   ALKPHOS 76 10/10/2019   AST 17 03/06/2020   ALT 12 03/06/2020   PROT 6.6 03/06/2020   ALBUMIN 4.5 10/10/2019   CALCIUM 9.3 04/12/2020  GFRAA 98 04/12/2020    Speciality Comments: No specialty comments available.  Procedures:  No procedures performed Allergies: Morphine and related and Acyclovir and related   Assessment / Plan:     Visit Diagnoses: Rheumatoid arthritis involving multiple sites with positive rheumatoid factor (HCC) - Positive rheumatoid factor, positive anti-CCP, severe erosive disease with nodulosis and contractures.  He states he continues to do well.  He has been doing a lot of gardening and using access in his yard.  He had mild extensor tenosynovitis in his right wrist.  He had some tenderness over wrist joints.  He has very limited fist formation.  High risk medication use - MTX 2.5 mg 6 tablets every 7 days and folic acid 1 mg 2 tablets daily.  - Plan: CBC with Differential/Platelet, COMPLETE METABOLIC PANEL WITH GFR today and then every 3 months to monitor for drug toxicity.  History of total knee replacement, bilateral-doing well.  Chronic pain syndrome - tylenol #3 for pain relief.  Other medical problems are  listed as follows:  Prediabetes  History of melanoma-he states he has recurrence of melanoma and will be going for the resection.  History of hyperlipidemia-increased risk of heart disease with rheumatoid arthritis was discussed.  His blood pressure is well controlled.  Dietary modifications and exercise was emphasized.  History of basal cell carcinoma  History of gastroesophageal reflux (GERD)  History of anemia-he has mild anemia which has been stable.  Most likely due to chronic disease.  Osteoporosis screening-based on his age and history of rheumatoid arthritis he should get DEXA scan.  He states he has been getting DEXA scan through his PCP.  Have advised him to get the latest report at the next visit.  Educated about COVID-19 virus infection-he is fully vaccinated against COVID-19.  He needs a booster.  Instructions were placed in the AVS regarding the booster.  Have advised him to delay methotrexate by 1 week after the booster.  Use of COVID-19 monoclonal antibody infusion was also discussed in case he develops an infection.  Use of mask, social distancing and hand hygiene was discussed.  Orders: Orders Placed This Encounter  Procedures  . CBC with Differential/Platelet  . COMPLETE METABOLIC PANEL WITH GFR   No orders of the defined types were placed in this encounter.   Follow-Up Instructions: Return in about 3 months (around 09/06/2020) for Rheumatoid arthritis.   Bo Merino, MD  Note - This record has been created using Editor, commissioning.  Chart creation errors have been sought, but may not always  have been located. Such creation errors do not reflect on  the standard of medical care.

## 2020-05-28 ENCOUNTER — Other Ambulatory Visit: Payer: Self-pay | Admitting: Rheumatology

## 2020-05-28 ENCOUNTER — Other Ambulatory Visit: Payer: Self-pay | Admitting: Physician Assistant

## 2020-05-28 NOTE — Telephone Encounter (Signed)
Last Visit: 01/10/2020 Next Visit: 06/06/2020 Labs: 03/06/2020 Glucose is elevated-129. Rest of CMP WNL. RBC count, Hgb, and hct are low but stable.   Current Dose per office note 01/10/2020: methotrexate 6 tablets by mouth once weekly DX: Rheumatoid arthritis involving multiple sites with positive rheumatoid factor   Okay to refill MTX?

## 2020-05-28 NOTE — Telephone Encounter (Signed)
Last Visit: 01/10/2020 Next Visit: 06/06/2020  Current Dose per office note 6/85/9923: folic acid 2 mg by mouth daily DX: Rheumatoid arthritis involving multiple sites with positive rheumatoid factor   Okay to refill Folic Acid per Dr. Estanislado Pandy.

## 2020-06-06 ENCOUNTER — Telehealth: Payer: Self-pay

## 2020-06-06 ENCOUNTER — Ambulatory Visit: Payer: PPO | Admitting: Rheumatology

## 2020-06-06 ENCOUNTER — Other Ambulatory Visit: Payer: Self-pay

## 2020-06-06 ENCOUNTER — Encounter: Payer: Self-pay | Admitting: Rheumatology

## 2020-06-06 VITALS — BP 104/66 | HR 69 | Ht 67.5 in | Wt 181.0 lb

## 2020-06-06 DIAGNOSIS — Z96653 Presence of artificial knee joint, bilateral: Secondary | ICD-10-CM | POA: Diagnosis not present

## 2020-06-06 DIAGNOSIS — Z862 Personal history of diseases of the blood and blood-forming organs and certain disorders involving the immune mechanism: Secondary | ICD-10-CM

## 2020-06-06 DIAGNOSIS — Z8719 Personal history of other diseases of the digestive system: Secondary | ICD-10-CM

## 2020-06-06 DIAGNOSIS — Z13820 Encounter for screening for osteoporosis: Secondary | ICD-10-CM | POA: Diagnosis not present

## 2020-06-06 DIAGNOSIS — R7303 Prediabetes: Secondary | ICD-10-CM

## 2020-06-06 DIAGNOSIS — M0579 Rheumatoid arthritis with rheumatoid factor of multiple sites without organ or systems involvement: Secondary | ICD-10-CM

## 2020-06-06 DIAGNOSIS — Z85828 Personal history of other malignant neoplasm of skin: Secondary | ICD-10-CM | POA: Diagnosis not present

## 2020-06-06 DIAGNOSIS — G894 Chronic pain syndrome: Secondary | ICD-10-CM | POA: Diagnosis not present

## 2020-06-06 DIAGNOSIS — Z79899 Other long term (current) drug therapy: Secondary | ICD-10-CM | POA: Diagnosis not present

## 2020-06-06 DIAGNOSIS — Z85820 Personal history of malignant melanoma of skin: Secondary | ICD-10-CM

## 2020-06-06 DIAGNOSIS — Z8639 Personal history of other endocrine, nutritional and metabolic disease: Secondary | ICD-10-CM | POA: Diagnosis not present

## 2020-06-06 DIAGNOSIS — Z7189 Other specified counseling: Secondary | ICD-10-CM

## 2020-06-06 NOTE — Telephone Encounter (Signed)
He should hold methotrexate for 1 week prior to the procedure and restart a week later if the lesion is healing well.  He will need permission from the dermatologist to restart the medication.

## 2020-06-06 NOTE — Telephone Encounter (Signed)
Patient called stating he is "having a procedure on Monday, 06/11/20 with his cancer doctor to remove a spot" and is not sure if he needs to discontinue taking his Methotrexate.  Patient is requesting a return call.

## 2020-06-06 NOTE — Patient Instructions (Addendum)
COVID-19 vaccine recommendations:   COVID-19 vaccine is recommended for everyone (unless you are allergic to a vaccine component), even if you are on a medication that suppresses your immune system.   If you are on Methotrexate, Cellcept (mycophenolate), Rinvoq, Morrie Sheldon, and Olumiant- hold the medication for 1 week after each vaccine. Hold Methotrexate for 2 weeks after the single dose COVID-19 vaccine.   Do not take Tylenol or any anti-inflammatory medications (NSAIDs) 24 hours prior to the COVID-19 vaccination.   There is no direct evidence about the efficacy of the COVID-19 vaccine in individuals who are on medications that suppress the immune system.   Even if you are fully vaccinated, and you are on any medications that suppress your immune system, please continue to wear a mask, maintain at least six feet social distance and practice hand hygiene.   If you develop a COVID-19 infection, please contact your PCP or our office to determine if you need antibody infusion.  The booster vaccine is now available for immunocompromised patients. It is advised that if you had Pfizer vaccine you should get [employer] booster.  If you had a Moderna vaccine then you should get a Moderna booster. Johnson and Wynetta Emery does not have a booster vaccine at this time.  Please see the following web sites for updated information.   [email or web address]-[email or web address]   Standing Labs We placed an order today for your standing lab work.   Please have your standing labs drawn in January and every 3 months  If possible, please have your labs drawn 2 weeks prior to your appointment so that the provider can discuss your results at your appointment.  We have open lab daily Monday through Thursday from 8:30-12:30 PM and 1:30-4:30 PM and Friday from 8:30-12:30 PM and 1:30-4:00 PM at the office of Dr. Bo Merino, [facility].   Please be advised,  patients with office appointments requiring lab work will take precedents over walk-in lab work.  If possible, please come for your lab work on Monday and Friday afternoons, as you may experience shorter wait times. The office is located at [address] [place], [place], [place] [address] No appointment is necessary.   Labs are drawn by [facility]. Please bring your co-pay at the time of your lab draw.  You may receive a bill from [facility] for your lab work.  If you wish to have your labs drawn at another location, please call the office 24 hours in advance to send orders.  If you have any questions regarding directions or hours of operation,  please call [phone].   As a reminder, please drink plenty of water prior to coming for your lab work. Thanks!

## 2020-06-06 NOTE — Telephone Encounter (Signed)
Patient advised he should hold methotrexate for 1 week prior to the procedure and restart a week later if the lesion is healing well.  He will need permission from the dermatologist to restart the medication. Patient expressed understanding.

## 2020-06-07 LAB — COMPLETE METABOLIC PANEL WITH GFR
AG Ratio: 1.8 (calc) (ref 1.0–2.5)
ALT: 12 U/L (ref 9–46)
AST: 20 U/L (ref 10–35)
Albumin: 4.2 g/dL (ref 3.6–5.1)
Alkaline phosphatase (APISO): 64 U/L (ref 35–144)
BUN/Creatinine Ratio: 29 (calc) — ABNORMAL HIGH (ref 6–22)
BUN: 18 mg/dL (ref 7–25)
CO2: 28 mmol/L (ref 20–32)
Calcium: 9.2 mg/dL (ref 8.6–10.3)
Chloride: 103 mmol/L (ref 98–110)
Creat: 0.62 mg/dL — ABNORMAL LOW (ref 0.70–1.18)
GFR, Est African American: 109 mL/min/{1.73_m2} (ref >=60)
GFR, Est Non African American: 94 mL/min/{1.73_m2} (ref >=60)
Globulin: 2.3 g/dL (calc) (ref 1.9–3.7)
Glucose, Bld: 112 mg/dL — ABNORMAL HIGH (ref 65–99)
Potassium: 4.4 mmol/L (ref 3.5–5.3)
Sodium: 138 mmol/L (ref 135–146)
Total Bilirubin: 0.9 mg/dL (ref 0.2–1.2)
Total Protein: 6.5 g/dL (ref 6.1–8.1)

## 2020-06-07 LAB — CBC WITH DIFFERENTIAL/PLATELET
Absolute Monocytes: 302 cells/uL (ref 200–950)
Basophils Absolute: 21 cells/uL (ref 0–200)
Basophils Relative: 0.4 %
Eosinophils Absolute: 151 cells/uL (ref 15–500)
Eosinophils Relative: 2.9 %
HCT: 32.5 % — ABNORMAL LOW (ref 38.5–50.0)
Hemoglobin: 10.8 g/dL — ABNORMAL LOW (ref 13.2–17.1)
Lymphs Abs: 2517 cells/uL (ref 850–3900)
MCH: 33.1 pg — ABNORMAL HIGH (ref 27.0–33.0)
MCHC: 33.2 g/dL (ref 32.0–36.0)
MCV: 99.7 fL (ref 80.0–100.0)
MPV: 9.9 fL (ref 7.5–12.5)
Monocytes Relative: 5.8 %
Neutro Abs: 2210 cells/uL (ref 1500–7800)
Neutrophils Relative %: 42.5 %
Platelets: 185 10*3/uL (ref 140–400)
RBC: 3.26 10*6/uL — ABNORMAL LOW (ref 4.20–5.80)
RDW: 13.2 % (ref 11.0–15.0)
Total Lymphocyte: 48.4 %
WBC: 5.2 10*3/uL (ref 3.8–10.8)

## 2020-06-07 NOTE — Progress Notes (Signed)
Hemoglobin is lower.  Glucose is mildly elevated, it was not a fasting sample.  Please advise patient not to take any anti-inflammatories.  He should see Dr. Wolfgang Phoenix for evaluation of anemia.

## 2020-06-08 ENCOUNTER — Telehealth: Payer: Self-pay | Admitting: Family Medicine

## 2020-06-08 ENCOUNTER — Telehealth: Payer: Self-pay | Admitting: Rheumatology

## 2020-06-08 DIAGNOSIS — D509 Iron deficiency anemia, unspecified: Secondary | ICD-10-CM

## 2020-06-08 NOTE — Addendum Note (Signed)
Addended by: Vicente Males on: 06/08/2020 03:21 PM   Modules accepted: Orders

## 2020-06-08 NOTE — Telephone Encounter (Signed)
Anemia Patient can do lab work In either do a follow-up visit with me in 2 weeks or follow-up with Santiago Glad next week CBC, TIBC, ferritin, Iron deficient anemia

## 2020-06-08 NOTE — Telephone Encounter (Signed)
Spoke with patient and reviewed labs with patient. Patient advised we do see where he has been in touch with Dr. Malachy Moan office and they should be in touch with him for further instructions.

## 2020-06-08 NOTE — Telephone Encounter (Signed)
Patient request a call back to discuss iron levels again. Patient is going to call Dr. Lance Sell office also to schedule an appointment.

## 2020-06-08 NOTE — Telephone Encounter (Signed)
Lab orders placed. Left message to return call  

## 2020-06-08 NOTE — Telephone Encounter (Signed)
(  very hard to understand exactly what patient is needing)  Pt calling in to schedule appt with Dr.Scott. Informed pt that we are booked for today but we could see about getting him in the next couple of day. Pt states he is having surgery on Monday at 8:30. Looking back into pts chart, it looks like pt needs to be evaluated for anemia per Dr. Estanislado Pandy message. Pt states he has not taken iron in a couple of days and was told to stop all meds one week before procedure. Pt is having a melanoma on neck removed at [facility].  Please advise. Thank you

## 2020-06-11 ENCOUNTER — Encounter: Payer: Self-pay | Admitting: Family Medicine

## 2020-06-11 DIAGNOSIS — L905 Scar conditions and fibrosis of skin: Secondary | ICD-10-CM | POA: Diagnosis not present

## 2020-06-11 DIAGNOSIS — L989 Disorder of the skin and subcutaneous tissue, unspecified: Secondary | ICD-10-CM | POA: Diagnosis not present

## 2020-06-11 DIAGNOSIS — D034 Melanoma in situ of scalp and neck: Secondary | ICD-10-CM | POA: Diagnosis not present

## 2020-06-11 NOTE — Telephone Encounter (Signed)
Patient wife notified of lab work. Please call pt to schedule appointment with Dr. Nicki Reaper or Santiago Glad in the next weeks or 2.

## 2020-06-12 DIAGNOSIS — D509 Iron deficiency anemia, unspecified: Secondary | ICD-10-CM | POA: Diagnosis not present

## 2020-06-13 ENCOUNTER — Other Ambulatory Visit: Payer: Self-pay | Admitting: Family Medicine

## 2020-06-13 LAB — CBC WITH DIFFERENTIAL/PLATELET
Basophils Absolute: 0 10*3/uL (ref 0.0–0.2)
Basos: 0 %
EOS (ABSOLUTE): 0.1 10*3/uL (ref 0.0–0.4)
Eos: 2 %
Hematocrit: 35.7 % — ABNORMAL LOW (ref 37.5–51.0)
Hemoglobin: 11.7 g/dL — ABNORMAL LOW (ref 13.0–17.7)
Immature Grans (Abs): 0 10*3/uL (ref 0.0–0.1)
Immature Granulocytes: 0 %
Lymphocytes Absolute: 3.1 10*3/uL (ref 0.7–3.1)
Lymphs: 44 %
MCH: 32.1 pg (ref 26.6–33.0)
MCHC: 32.8 g/dL (ref 31.5–35.7)
MCV: 98 fL — ABNORMAL HIGH (ref 79–97)
Monocytes Absolute: 0.5 10*3/uL (ref 0.1–0.9)
Monocytes: 7 %
Neutrophils Absolute: 3.3 10*3/uL (ref 1.4–7.0)
Neutrophils: 47 %
Platelets: 191 10*3/uL (ref 150–450)
RBC: 3.64 x10E6/uL — ABNORMAL LOW (ref 4.14–5.80)
RDW: 13.6 % (ref 11.6–15.4)
WBC: 7 10*3/uL (ref 3.4–10.8)

## 2020-06-13 LAB — IRON AND TIBC
Iron Saturation: 20 % (ref 15–55)
Iron: 62 ug/dL (ref 38–169)
Total Iron Binding Capacity: 307 ug/dL (ref 250–450)
UIBC: 245 ug/dL (ref 111–343)

## 2020-06-13 LAB — FERRITIN: Ferritin: 179 ng/mL (ref 30–400)

## 2020-06-25 ENCOUNTER — Other Ambulatory Visit: Payer: Self-pay

## 2020-06-25 ENCOUNTER — Ambulatory Visit (INDEPENDENT_AMBULATORY_CARE_PROVIDER_SITE_OTHER): Payer: PPO | Admitting: Family Medicine

## 2020-06-25 ENCOUNTER — Encounter: Payer: Self-pay | Admitting: Family Medicine

## 2020-06-25 VITALS — BP 110/60 | HR 93 | Temp 97.4°F | Wt 176.4 lb

## 2020-06-25 DIAGNOSIS — D638 Anemia in other chronic diseases classified elsewhere: Secondary | ICD-10-CM | POA: Diagnosis not present

## 2020-06-25 DIAGNOSIS — D692 Other nonthrombocytopenic purpura: Secondary | ICD-10-CM | POA: Diagnosis not present

## 2020-06-25 DIAGNOSIS — E782 Mixed hyperlipidemia: Secondary | ICD-10-CM

## 2020-06-25 DIAGNOSIS — Z23 Encounter for immunization: Secondary | ICD-10-CM

## 2020-06-25 NOTE — Patient Instructions (Signed)
Hi Bryan Maldonado  It was good to see you today.  We did your flu shot today. I will inform your rheumatologist of the results of your recent lab work. Please do your stool test for blood and send it back to [place] or drop it by. Please follow-up in March  Iron tablet 1 daily is plenty. TakeCare-Dr. Nicki Reaper

## 2020-06-25 NOTE — Progress Notes (Signed)
° °  Subjective:    Patient ID: Bryan Homans Sr., male    DOB: 1940/09/12, 79 y.o.   MRN: [id number]  HPI  Patient comes in to discuss recent blood work.  We did discuss how his hemoglobin was low but lab work overall looks fairly good more than likely his anemia is related to his anemia of chronic disease Had skin cancer removed from left side of neck.  Review of Systems  Constitutional: Negative for activity change.  HENT: Negative for congestion and rhinorrhea.   Respiratory: Negative for cough and shortness of breath.   Cardiovascular: Negative for chest pain.  Gastrointestinal: Negative for abdominal pain, diarrhea, nausea and vomiting.  Genitourinary: Negative for dysuria and hematuria.  Musculoskeletal: Positive for arthralgias. Negative for back pain.  Neurological: Negative for weakness and headaches.  Psychiatric/Behavioral: Negative for behavioral problems and confusion.       Objective:   Physical Exam Vitals reviewed.  Cardiovascular:     Rate and Rhythm: Normal rate and regular rhythm.     Heart sounds: Normal heart sounds. No murmur heard.   Pulmonary:     Effort: Pulmonary effort is normal.     Breath sounds: Normal breath sounds.  Lymphadenopathy:     Cervical: No cervical adenopathy.  Neurological:     Mental Status: He is alert.  Psychiatric:        Behavior: Behavior normal.     [name] noted on the arms      Assessment & Plan:  1. Anemia, chronic disease Anemia of chronic disease stool test for blood if negative no further work-up if positive referral to GI - IFOBT POC (occult bld, rslt in office); Future  2. [name] (HCC) Noted on the arms  3. Mixed hyperlipidemia Healthy diet regular physical activity continue cholesterol medicine Lipitor  4. Need for vaccination Flu shot today - Flu Vaccine QUAD High Dose(Fluad) Patient has wellness exam coming up in March follow-up sooner if problems

## 2020-06-28 ENCOUNTER — Other Ambulatory Visit: Payer: Self-pay | Admitting: *Deleted

## 2020-06-28 ENCOUNTER — Ambulatory Visit: Payer: PPO

## 2020-06-28 DIAGNOSIS — D638 Anemia in other chronic diseases classified elsewhere: Secondary | ICD-10-CM

## 2020-06-28 LAB — IFOBT (OCCULT BLOOD): IFOBT: NEGATIVE

## 2020-07-03 ENCOUNTER — Other Ambulatory Visit: Payer: Self-pay | Admitting: Family Medicine

## 2020-07-17 DIAGNOSIS — Z1283 Encounter for screening for malignant neoplasm of skin: Secondary | ICD-10-CM | POA: Diagnosis not present

## 2020-07-17 DIAGNOSIS — D225 Melanocytic nevi of trunk: Secondary | ICD-10-CM | POA: Diagnosis not present

## 2020-07-17 DIAGNOSIS — Z08 Encounter for follow-up examination after completed treatment for malignant neoplasm: Secondary | ICD-10-CM | POA: Diagnosis not present

## 2020-07-17 DIAGNOSIS — X32XXXD Exposure to sunlight, subsequent encounter: Secondary | ICD-10-CM | POA: Diagnosis not present

## 2020-07-17 DIAGNOSIS — Z85820 Personal history of malignant melanoma of skin: Secondary | ICD-10-CM | POA: Diagnosis not present

## 2020-07-17 DIAGNOSIS — L570 Actinic keratosis: Secondary | ICD-10-CM | POA: Diagnosis not present

## 2020-08-20 ENCOUNTER — Telehealth: Payer: Self-pay

## 2020-08-20 ENCOUNTER — Other Ambulatory Visit: Payer: Self-pay | Admitting: *Deleted

## 2020-08-20 DIAGNOSIS — Z79899 Other long term (current) drug therapy: Secondary | ICD-10-CM | POA: Diagnosis not present

## 2020-08-20 MED ORDER — METHOTREXATE SODIUM 2.5 MG PO TABS: 15.0000 mg | ORAL_TABLET | ORAL | 0 refills | Status: DC

## 2020-08-20 NOTE — Telephone Encounter (Signed)
Last Visit: 06/06/2020 Next Visit: 09/05/2020 Labs: 06/06/2020 Hemoglobin is lower. Glucose is mildly elevated, it was not a fasting sample.  Current Dose per office note 06/06/2020:  MTX 2.5 mg 6 tablets every 7 days  DX: Rheumatoid arthritis involving multiple sites with positive rheumatoid factor   Patient updated labs today.   Okay to refill MTX?

## 2020-08-20 NOTE — Telephone Encounter (Signed)
Patient requested prescription refill of Methotrexate to be sent to [facility] in [place].

## 2020-08-21 LAB — COMPLETE METABOLIC PANEL WITH GFR
AG Ratio: 1.8 (calc) (ref 1.0–2.5)
ALT: 12 U/L (ref 9–46)
AST: 20 U/L (ref 10–35)
Albumin: 4.5 g/dL (ref 3.6–5.1)
Alkaline phosphatase (APISO): 70 U/L (ref 35–144)
BUN: 19 mg/dL (ref 7–25)
CO2: 29 mmol/L (ref 20–32)
Calcium: 9.5 mg/dL (ref 8.6–10.3)
Chloride: 103 mmol/L (ref 98–110)
Creat: 0.87 mg/dL (ref 0.70–1.11)
GFR, Est African American: 94 mL/min/{1.73_m2} (ref >=60)
GFR, Est Non African American: 82 mL/min/{1.73_m2} (ref >=60)
Globulin: 2.5 g/dL (calc) (ref 1.9–3.7)
Glucose, Bld: 87 mg/dL (ref 65–99)
Potassium: 4.8 mmol/L (ref 3.5–5.3)
Sodium: 140 mmol/L (ref 135–146)
Total Bilirubin: 0.8 mg/dL (ref 0.2–1.2)
Total Protein: 7 g/dL (ref 6.1–8.1)

## 2020-08-21 LAB — CBC WITH DIFFERENTIAL/PLATELET
Absolute Monocytes: 629 cells/uL (ref 200–950)
Basophils Absolute: 30 cells/uL (ref 0–200)
Basophils Relative: 0.4 %
Eosinophils Absolute: 289 cells/uL (ref 15–500)
Eosinophils Relative: 3.9 %
HCT: 37.5 % — ABNORMAL LOW (ref 38.5–50.0)
Hemoglobin: 12.6 g/dL — ABNORMAL LOW (ref 13.2–17.1)
Lymphs Abs: 3715 cells/uL (ref 850–3900)
MCH: 33 pg (ref 27.0–33.0)
MCHC: 33.6 g/dL (ref 32.0–36.0)
MCV: 98.2 fL (ref 80.0–100.0)
MPV: 10 fL (ref 7.5–12.5)
Monocytes Relative: 8.5 %
Neutro Abs: 2738 cells/uL (ref 1500–7800)
Neutrophils Relative %: 37 %
Platelets: 193 10*3/uL (ref 140–400)
RBC: 3.82 10*6/uL — ABNORMAL LOW (ref 4.20–5.80)
RDW: 13.4 % (ref 11.0–15.0)
Total Lymphocyte: 50.2 %
WBC: 7.4 10*3/uL (ref 3.8–10.8)

## 2020-08-22 NOTE — Progress Notes (Signed)
Office Visit Note  Patient: Bryan Thetford Cudmore Sr.             Date of Birth: [birth year]           MRN: [id number]             PCP: Kathyrn Drown, MD Referring: Kathyrn Drown, MD Visit Date: 09/05/2020 Occupation: @GUAROCC @  Subjective:  Pain in both feet   History of Present Illness: Bryan PALAZZI Sr. is a 79 y.o. male with history of seropositive rheuamatoid arthritis.  He is taking methotrexate 6 tablets by mouth once weekly and folic acid 2 mg po daily.  He is tolerating methotrexate without any side effects.  He has not missed any doses recently.  He denies any recent rheumatoid arthritis flares.  He experiences occasional discomfort in both hands and both feet.  He states that he has some instability in both ankles and has had several falls in the past.  He has been using a cane to assist with ambulation. He denies any discomfort or swelling in his knee joints at this time.  He has noticed some increased stiffness in his neck but denies any discomfort or symptoms of radiculopathy at this time.  He plans on starting to go back to Silver sneakers for exercise. He denies any recent infections.    Activities of Daily Living:  Patient reports morning stiffness for 15 minutes.   Patient Reports nocturnal pain.  Difficulty dressing/grooming: Reports Difficulty climbing stairs: Denies Difficulty getting out of chair: Denies Difficulty using hands for taps, buttons, cutlery, and/or writing: Reports  Review of Systems  Constitutional: Negative for fatigue.  HENT: Positive for mouth dryness and nose dryness. Negative for mouth sores.   Eyes: Positive for dryness. Negative for pain and itching.  Respiratory: Negative for shortness of breath and difficulty breathing.   Cardiovascular: Negative for chest pain and palpitations.  Gastrointestinal: Negative for blood in stool, constipation and diarrhea.  Endocrine: Negative for increased urination.  Genitourinary: Negative for difficulty  urinating.  Musculoskeletal: Positive for arthralgias, joint pain, joint swelling, myalgias, morning stiffness, muscle tenderness and myalgias.  Skin: Negative for color change, rash and redness.  Allergic/Immunologic: Negative for susceptible to infections.  Neurological: Positive for numbness and parasthesias. Negative for dizziness, headaches, memory loss and weakness.  Hematological: Positive for bruising/bleeding tendency.  Psychiatric/Behavioral: Negative for confusion.    PMFS History:  Patient Active Problem List   Diagnosis Date Noted  . Anemia, chronic disease 06/25/2020  . High risk medication use 09/19/2016  . History of total knee replacement, bilateral 09/19/2016  . Gastroesophageal reflux disease without esophagitis 09/19/2016  . History of melanoma 09/19/2016  . History of basal cell carcinoma 09/19/2016  . History of anemia 09/19/2016  . Allergic rhinitis 02/18/2016  . Melanoma of skin ([place]) 06/06/2015  . Prediabetes 11/20/2014  . Hyperlipidemia 11/20/2014  . BPH (benign prostatic hyperplasia) 05/22/2014  . Rheumatoid arthritis involving multiple sites with positive rheumatoid factor ([place]) 01/05/2013  . Chronic pain syndrome 01/05/2013    Past Medical History:  Diagnosis Date  . Anemia   . Basal cell carcinoma   . BPH (benign prostatic hyperplasia)   . GERD (gastroesophageal reflux disease)   . Melanoma ([place])   . Osteoarthritis   . Rheumatoid aortitis   . Rheumatoid arthritis ([place])     Family History  Problem Relation Age of Onset  . Rheum arthritis Mother   . Emphysema Father   . Cancer Brother   .  Emphysema Sister    Past Surgical History:  Procedure Laterality Date  . CATARACT EXTRACTION W/PHACO Right 03/12/2020   Procedure: CATARACT EXTRACTION PHACO AND INTRAOCULAR LENS PLACEMENT RIGHT EYE;  Surgeon: Fabio Pierce, MD;  Location: [facility];  Service: Ophthalmology;  Laterality: Right;  CDE: 9.99  . CATARACT EXTRACTION W/PHACO Left 03/26/2020    Procedure: CATARACT EXTRACTION PHACO AND INTRAOCULAR LENS PLACEMENT (I[facility]);  Surgeon: Fabio Pierce, MD;  Location: [facility];  Service: Ophthalmology;  Laterality: Left;  CDE: 9.16   . COLONOSCOPY    . HERNIA REPAIR    . JOINT REPLACEMENT Bilateral   . KNEE ARTHROPLASTY    . MELANOMA EXCISION    . MELANOMA EXCISION  09/22/2018   on head   Social History   Social History Narrative  . Not on file   Immunization History  Administered Date(s) Administered  . Fluad Quad(high Dose 65+) 06/25/2020  . Influenza Split 05/19/2013  . Influenza, High Dose Seasonal PF 05/20/2019  . Influenza,inj,Quad PF,6+ Mos 05/22/2014, 05/22/2015, 06/06/2016, 06/10/2017, 05/14/2018  . Influenza-Unspecified 05/20/2019  . PFIZER SARS-COV-2 Vaccination 11/14/2019, 12/05/2019  . Pneumococcal Conjugate-13 05/22/2014  . Pneumococcal Polysaccharide-23 05/19/2013  . Zoster Recombinat (Shingrix) 09/06/2018, 03/11/2019     Objective: Vital Signs: BP (!) 144/86 (BP Location: Left Arm, Patient Position: Sitting, Cuff Size: Normal)   Pulse 70   Resp 17   Ht 5' 7.5" (1.715 m)   Wt 186 lb 12.8 oz (84.7 kg)   BMI 28.83 kg/m    Physical Exam Vitals and nursing note reviewed.  Constitutional:      Appearance: He is well-developed and well-nourished.  HENT:     Head: Normocephalic and atraumatic.  Eyes:     Extraocular Movements: EOM normal.     Conjunctiva/sclera: Conjunctivae normal.     Pupils: Pupils are equal, round, and reactive to light.  Pulmonary:     Effort: Pulmonary effort is normal.  Abdominal:     Palpations: Abdomen is soft.  Musculoskeletal:     Cervical back: Normal range of motion and neck supple.  Skin:    General: Skin is warm and dry.     Capillary Refill: Capillary refill takes less than 2 seconds.  Neurological:     Mental Status: He is alert and oriented to person, place, and time.  Psychiatric:        Mood and Affect: Mood and affect normal.        Behavior: Behavior normal.       Musculoskeletal Exam: C-spine limited ROM with lateral rotation bilaterally.  Postural thoracic kyphosis noted.  No midline spinal tenderness or SI joint tenderness noted.  Shoulder joint abduction to about 90 degrees bilaterally.  Contracture of both elbows noted.  Thickening and limited ROM of both wrist joints noted.  Thickening of all MCPs.  Contractures of MCPs and PIP joints.  Bilateral knee replacements have good ROM with no discomfort.  Ankle joints are subluxed and thickened bilaterally.   CDAI Exam: CDAI Score: -- Patient Global: 4 mm; Provider Global: -- Swollen: 0 ; Tender: 0  Joint Exam 09/05/2020   No joint exam has been documented for this visit   There is currently no information documented on the homunculus. Go to the Rheumatology activity and complete the homunculus joint exam.  Investigation: No additional findings.  Imaging: No results found.  Recent Labs: Lab Results  Component Value Date   WBC 7.4 08/20/2020   HGB 12.6 (L) 08/20/2020   PLT 193 08/20/2020  NA 140 08/20/2020   K 4.8 08/20/2020   CL 103 08/20/2020   CO2 29 08/20/2020   GLUCOSE 87 08/20/2020   BUN 19 08/20/2020   CREATININE 0.87 08/20/2020   BILITOT 0.8 08/20/2020   ALKPHOS 76 10/10/2019   AST 20 08/20/2020   ALT 12 08/20/2020   PROT 7.0 08/20/2020   ALBUMIN 4.5 10/10/2019   CALCIUM 9.5 08/20/2020   GFRAA 94 08/20/2020    Speciality Comments: No specialty comments available.  Procedures:  No procedures performed Allergies: Morphine and related and Acyclovir and related   Assessment / Plan:     Visit Diagnoses: Rheumatoid arthritis involving multiple sites with positive rheumatoid factor (HCC) - Positive rheumatoid factor, positive anti-CCP, severe erosive disease with nodulosis and contractures: He has no active synovitis on examination today.  He has synovial thickening of bilateral wrist joints, all MCP joints, and both ankle joints.  Subluxation of both ankle joints were noted  on exam.  He is clinically doing well on methotrexate 6 tablets by mouth once weekly and folic acid 2 mg by mouth daily.  X-rays of both hands and feet were updated on 05/07/2020.  There was no radiographic progression when compared to x-rays from 2012.  He will continue taking methotrexate and folic acid as prescribed.  He does not need any refills at this time. He was strongly encouraged to return to silver sneakers for exercise.  He was advised to notify us if he develops increased joint pain or joint swelling.  He will follow-up in the office in 5 months.  High risk medication use - MTX 2.5 mg 6 tablets by mouth every 7 days and folic acid 1 mg 2 tablets daily.  CBC and CMP were updated on 08/20/2020.  Anemia has improved.  LFTs and renal function were within normal limits.  He will be due to update lab work in March and every 3 months to monitor for drug toxicity.  Standing orders for CBC and CMP remain in place. He has not had any recent infections.  Discussed the importance of holding methotrexate if he develops signs or symptoms of an infection and to resume once the infection has completely cleared. He has received both COVID-19 vaccinations.  History of total knee replacement, bilateral: Doing well.  He has good range of motion with no discomfort on exam today.  He uses a cane to assist with ambulation.  Chronic pain syndrome - He takes tylenol #3 or tylenol arthritis  for pain relief.  Other medical conditions are listed as follows:  Prediabetes  History of melanoma  History of basal cell carcinoma  History of hyperlipidemia  History of anemia  History of gastroesophageal reflux (GERD)  Osteoporosis screening - He states he has been getting DEXA scan through his PCP  Orders: No orders of the defined types were placed in this encounter.  No orders of the defined types were placed in this encounter.     Follow-Up Instructions: Return in about 5 months (around 02/03/2021) for  Rheumatoid arthritis.   Ofilia Neas, PA-C  Note - This record has been created using Dragon software.  Chart creation errors have been sought, but may not always  have been located. Such creation errors do not reflect on  the standard of medical care.

## 2020-08-29 ENCOUNTER — Other Ambulatory Visit: Payer: Self-pay | Admitting: Rheumatology

## 2020-08-29 NOTE — Telephone Encounter (Signed)
Last Visit: 06/06/2020 Next Visit: 09/05/2020  Okay to refill per Dr. Corliss Skains

## 2020-09-05 ENCOUNTER — Ambulatory Visit: Payer: PPO | Admitting: Physician Assistant

## 2020-09-05 ENCOUNTER — Encounter: Payer: Self-pay | Admitting: Physician Assistant

## 2020-09-05 ENCOUNTER — Other Ambulatory Visit: Payer: Self-pay

## 2020-09-05 VITALS — BP 144/86 | HR 70 | Resp 17 | Ht 67.5 in | Wt 186.8 lb

## 2020-09-05 DIAGNOSIS — M0579 Rheumatoid arthritis with rheumatoid factor of multiple sites without organ or systems involvement: Secondary | ICD-10-CM | POA: Diagnosis not present

## 2020-09-05 DIAGNOSIS — Z8719 Personal history of other diseases of the digestive system: Secondary | ICD-10-CM

## 2020-09-05 DIAGNOSIS — Z8639 Personal history of other endocrine, nutritional and metabolic disease: Secondary | ICD-10-CM | POA: Diagnosis not present

## 2020-09-05 DIAGNOSIS — G894 Chronic pain syndrome: Secondary | ICD-10-CM

## 2020-09-05 DIAGNOSIS — Z85828 Personal history of other malignant neoplasm of skin: Secondary | ICD-10-CM

## 2020-09-05 DIAGNOSIS — Z862 Personal history of diseases of the blood and blood-forming organs and certain disorders involving the immune mechanism: Secondary | ICD-10-CM | POA: Diagnosis not present

## 2020-09-05 DIAGNOSIS — Z85820 Personal history of malignant melanoma of skin: Secondary | ICD-10-CM

## 2020-09-05 DIAGNOSIS — R7303 Prediabetes: Secondary | ICD-10-CM

## 2020-09-05 DIAGNOSIS — Z96653 Presence of artificial knee joint, bilateral: Secondary | ICD-10-CM

## 2020-09-05 DIAGNOSIS — Z13820 Encounter for screening for osteoporosis: Secondary | ICD-10-CM

## 2020-09-05 DIAGNOSIS — Z79899 Other long term (current) drug therapy: Secondary | ICD-10-CM

## 2020-09-05 NOTE — Patient Instructions (Addendum)
Standing Labs We placed an order today for your standing lab work.   Please have your standing labs drawn in March and every 3 months   If possible, please have your labs drawn 2 weeks prior to your appointment so that the provider can discuss your results at your appointment.  We have open lab daily Monday through Thursday from 8:30-12:30 PM and 1:30-4:30 PM and Friday from 8:30-12:30 PM and 1:30-4:00 PM at the office of Dr. Bo Merino, [facility].   Please be advised, patients with office appointments requiring lab work will take precedents over walk-in lab work.  If possible, please come for your lab work on Monday and Friday afternoons, as you may experience shorter wait times. The office is located at [address] [place], [place], [place] [address] No appointment is necessary.   Labs are drawn by [facility]. Please bring your co-pay at the time of your lab draw.  You may receive a bill from [facility] for your lab work.  If you wish to have your labs drawn at another location, please call the office 24 hours in advance to send orders.  If you have any questions regarding directions or hours of operation,  please call [phone].   As a reminder, please drink plenty of water prior to coming for your lab work. Thanks!    Neck Exercises Ask your health care provider which exercises are safe for you. Do exercises exactly as told by your health care provider and adjust them as directed. It is normal to feel mild stretching, pulling, tightness, or discomfort as you do these exercises. Stop right away if you feel sudden pain or your pain gets worse. Do not begin these exercises until told by your health care provider. Neck exercises can be important for many reasons. They can improve strength and maintain flexibility in your neck, which will help your upper back and prevent neck pain. Stretching exercises Rotation neck stretching  1. Sit in a chair or stand up. 2. Place  your feet flat on the floor, shoulder width apart. 3. Slowly turn your head (rotate) to the right until a slight stretch is felt. Turn it all the way to the right so you can look over your right shoulder. Do not tilt or tip your head. 4. Hold this position for 10-30 seconds. 5. Slowly turn your head (rotate) to the left until a slight stretch is felt. Turn it all the way to the left so you can look over your left shoulder. Do not tilt or tip your head. 6. Hold this position for 10-30 seconds. Repeat __________ times. Complete this exercise __________ times a day. Neck retraction 1. Sit in a sturdy chair or stand up. 2. Look straight ahead. Do not bend your neck. 3. Use your fingers to push your chin backward (retraction). Do not bend your neck for this movement. Continue to face straight ahead. If you are doing the exercise properly, you will feel a slight sensation in your throat and a stretch at the back of your neck. 4. Hold the stretch for 1-2 seconds. Repeat __________ times. Complete this exercise __________ times a day. Strengthening exercises Neck press 1. Lie on your back on a firm bed or on the floor with a pillow under your head. 2. Use your neck muscles to push your head down on the pillow and straighten your spine. 3. Hold the position as well as you can. Keep your head facing up (in a neutral position) and your  chin tucked. 4. Slowly count to 5 while holding this position. Repeat __________ times. Complete this exercise __________ times a day. Isometrics These are exercises in which you strengthen the muscles in your neck while keeping your neck still (isometrics). 1. Sit in a supportive chair and place your hand on your forehead. 2. Keep your head and face facing straight ahead. Do not flex or extend your neck while doing isometrics. 3. Push forward with your head and neck while pushing back with your hand. Hold for 10 seconds. 4. Do the sequence again, this time putting your  hand against the back of your head. Use your head and neck to push backward against the hand pressure. 5. Finally, do the same exercise on either side of your head, pushing sideways against the pressure of your hand. Repeat __________ times. Complete this exercise __________ times a day. Prone head lifts 1. Lie face-down (prone position), resting on your elbows so that your chest and upper back are raised. 2. Start with your head facing downward, near your chest. Position your chin either on or near your chest. 3. Slowly lift your head upward. Lift until you are looking straight ahead. Then continue lifting your head as far back as you can comfortably stretch. 4. Hold your head up for 5 seconds. Then slowly lower it to your starting position. Repeat __________ times. Complete this exercise __________ times a day. Supine head lifts 1. Lie on your back (supine position), bending your knees to point to the ceiling and keeping your feet flat on the floor. 2. Lift your head slowly off the floor, raising your chin toward your chest. 3. Hold for 5 seconds. Repeat __________ times. Complete this exercise __________ times a day. Scapular retraction 1. Stand with your arms at your sides. Look straight ahead. 2. Slowly pull both shoulders (scapulae) backward and downward (retraction) until you feel a stretch between your shoulder blades in your upper back. 3. Hold for 10-30 seconds. 4. Relax and repeat. Repeat __________ times. Complete this exercise __________ times a day. Contact a health care provider if:  Your neck pain or discomfort gets much worse when you do an exercise.  Your neck pain or discomfort does not improve within 2 hours after you exercise. If you have any of these problems, stop exercising right away. Do not do the exercises again unless your health care provider says that you can. Get help right away if:  You develop sudden, severe neck pain. If this happens, stop exercising  right away. Do not do the exercises again unless your health care provider says that you can. This information is not intended to replace advice given to you by your health care provider. Make sure you discuss any questions you have with your health care provider. Document Revised: 06/16/2018 Document Reviewed: 06/16/2018 Elsevier Patient Education  2020 [employer].

## 2020-09-14 ENCOUNTER — Other Ambulatory Visit: Payer: Self-pay | Admitting: Family Medicine

## 2020-10-09 DIAGNOSIS — X32XXXD Exposure to sunlight, subsequent encounter: Secondary | ICD-10-CM | POA: Diagnosis not present

## 2020-10-09 DIAGNOSIS — D225 Melanocytic nevi of trunk: Secondary | ICD-10-CM | POA: Diagnosis not present

## 2020-10-09 DIAGNOSIS — L570 Actinic keratosis: Secondary | ICD-10-CM | POA: Diagnosis not present

## 2020-10-09 DIAGNOSIS — Z1283 Encounter for screening for malignant neoplasm of skin: Secondary | ICD-10-CM | POA: Diagnosis not present

## 2020-10-09 DIAGNOSIS — Z08 Encounter for follow-up examination after completed treatment for malignant neoplasm: Secondary | ICD-10-CM | POA: Diagnosis not present

## 2020-10-09 DIAGNOSIS — Z85820 Personal history of malignant melanoma of skin: Secondary | ICD-10-CM | POA: Diagnosis not present

## 2020-11-06 ENCOUNTER — Other Ambulatory Visit: Payer: Self-pay | Admitting: *Deleted

## 2020-11-06 DIAGNOSIS — Z79899 Other long term (current) drug therapy: Secondary | ICD-10-CM | POA: Diagnosis not present

## 2020-11-07 LAB — CBC WITH DIFFERENTIAL/PLATELET
Absolute Monocytes: 467 cells/uL (ref 200–950)
Basophils Absolute: 32 cells/uL (ref 0–200)
Basophils Relative: 0.5 %
Eosinophils Absolute: 160 cells/uL (ref 15–500)
Eosinophils Relative: 2.5 %
HCT: 34.4 % — ABNORMAL LOW (ref 38.5–50.0)
Hemoglobin: 11.5 g/dL — ABNORMAL LOW (ref 13.2–17.1)
Lymphs Abs: 2406 cells/uL (ref 850–3900)
MCH: 33.1 pg — ABNORMAL HIGH (ref 27.0–33.0)
MCHC: 33.4 g/dL (ref 32.0–36.0)
MCV: 99.1 fL (ref 80.0–100.0)
MPV: 9.8 fL (ref 7.5–12.5)
Monocytes Relative: 7.3 %
Neutro Abs: 3334 cells/uL (ref 1500–7800)
Neutrophils Relative %: 52.1 %
Platelets: 168 10*3/uL (ref 140–400)
RBC: 3.47 10*6/uL — ABNORMAL LOW (ref 4.20–5.80)
RDW: 13 % (ref 11.0–15.0)
Total Lymphocyte: 37.6 %
WBC: 6.4 10*3/uL (ref 3.8–10.8)

## 2020-11-07 LAB — COMPLETE METABOLIC PANEL WITH GFR
AG Ratio: 1.8 (calc) (ref 1.0–2.5)
ALT: 12 U/L (ref 9–46)
AST: 17 U/L (ref 10–35)
Albumin: 4.2 g/dL (ref 3.6–5.1)
Alkaline phosphatase (APISO): 65 U/L (ref 35–144)
BUN: 16 mg/dL (ref 7–25)
CO2: 29 mmol/L (ref 20–32)
Calcium: 9 mg/dL (ref 8.6–10.3)
Chloride: 103 mmol/L (ref 98–110)
Creat: 0.84 mg/dL (ref 0.70–1.11)
GFR, Est African American: 96 mL/min/{1.73_m2} (ref >=60)
GFR, Est Non African American: 83 mL/min/{1.73_m2} (ref >=60)
Globulin: 2.3 g/dL (calc) (ref 1.9–3.7)
Glucose, Bld: 133 mg/dL — ABNORMAL HIGH (ref 65–99)
Potassium: 4.3 mmol/L (ref 3.5–5.3)
Sodium: 139 mmol/L (ref 135–146)
Total Bilirubin: 0.8 mg/dL (ref 0.2–1.2)
Total Protein: 6.5 g/dL (ref 6.1–8.1)

## 2020-11-07 NOTE — Progress Notes (Signed)
Anemia persists which is stable.  Glucose is mildly elevated, probably not a fasting lab.

## 2020-11-14 ENCOUNTER — Ambulatory Visit: Payer: PPO | Admitting: Family Medicine

## 2020-11-14 ENCOUNTER — Encounter: Payer: Self-pay | Admitting: Family Medicine

## 2020-11-25 ENCOUNTER — Other Ambulatory Visit: Payer: Self-pay | Admitting: Rheumatology

## 2020-11-25 ENCOUNTER — Other Ambulatory Visit: Payer: Self-pay | Admitting: Physician Assistant

## 2020-11-25 NOTE — Telephone Encounter (Signed)
Next Visit: 02/06/2021  Last Visit: 09/05/2020  Last Fill: 12/292021  Dx: Rheumatoid arthritis involving multiple sites with positive rheumatoid factor   Current Dose per office note on 12/02/3534, folic acid 1 mg 2 tablets daily  Okay to refill folic acid?

## 2020-11-25 NOTE — Telephone Encounter (Signed)
Next Visit: 02/06/2021  Last Visit: 09/05/2020  Last Fill: 08/20/2020  DX:  Rheumatoid arthritis involving multiple sites with positive rheumatoid factor   Current Dose per office note 09/05/2020, MTX 2.5 mg 6 tablets by mouth every 7 days   Labs: 11/06/2020 Anemia persists which is stable. Glucose is mildly elevated, probably not a fasting lab.  Okay to refill MTX?

## 2020-12-11 DIAGNOSIS — Z029 Encounter for administrative examinations, unspecified: Secondary | ICD-10-CM

## 2020-12-12 ENCOUNTER — Other Ambulatory Visit: Payer: Self-pay | Admitting: Family Medicine

## 2020-12-26 ENCOUNTER — Encounter: Payer: PPO | Admitting: Family Medicine

## 2021-01-04 ENCOUNTER — Other Ambulatory Visit: Payer: Self-pay | Admitting: Family Medicine

## 2021-01-07 NOTE — Progress Notes (Signed)
Subjective:   Penni Homans Sr. is a 80 y.o. male who presents for Medicare Annual/Subsequent preventive examination.  I connected with Mosie Lukes today by telephone and verified that I am speaking with the correct person using two identifiers. Location patient: home Location provider: work Persons participating in the virtual visit: patient, provider.   I discussed the limitations, risks, security and privacy concerns of performing an evaluation and management service by telephone and the availability of in person appointments. I also discussed with the patient that there may be a patient responsible charge related to this service. The patient expressed understanding and verbally consented to this telephonic visit.    Interactive audio and video telecommunications were attempted between this provider and patient, however failed, due to patient having technical difficulties OR patient did not have access to video capability.  We continued and completed visit with audio only.      Review of Systems    N/A  Cardiac Risk Factors include: advanced age (>53men, >57 women);male gender;dyslipidemia     Objective:    Today's Vitals   There is no height or weight on file to calculate BMI.  Advanced Directives 01/08/2021 03/26/2020 09/15/2017  Does Patient Have a Medical Advance Directive? Yes Yes No;Yes  Type of Paramedic of [place];Living will Living will [place]  Does patient want to make changes to medical advance directive? No - Patient declined No - Patient declined -  Copy of [place] in Chart? Yes - validated most recent copy scanned in chart (See row information) - -    Current Medications (verified) Outpatient Encounter Medications as of 01/08/2021  Medication Sig  . acetaminophen-codeine (TYLENOL #3) 300-30 MG tablet TAKE 1 TABLET BY MOUTH TWICE DAILY AS NEEDED FOR MODERATE PAIN  . aspirin 81 MG EC tablet Take  81 mg by mouth daily.   Marland Kitchen atorvastatin (LIPITOR) 10 MG tablet Take 1 tablet by mouth once daily  . b complex vitamins tablet Take 1 tablet by mouth daily.  . beta carotene w/minerals (OCUVITE) tablet Take 1 tablet by mouth daily.  . Calcium-Magnesium-Zinc 167-83-8 MG TABS Take 3 tablets by mouth daily.  . cetirizine (ZYRTEC) 10 MG tablet Take 10 mg by mouth daily.   . Ferrous Sulfate (IRON) 325 (65 FE) MG TABS Take 325 mg by mouth daily.   . fluorometholone (FML) 0.1 % ophthalmic suspension Place 1 drop into both eyes 3 (three) times daily.   Marland Kitchen FLUoxetine (PROZAC) 10 MG capsule Take 1 capsule by mouth once daily  . folic acid (FOLVITE) 1 MG tablet Take 2 tablets by mouth once daily  . furosemide (LASIX) 20 MG tablet Take 1 tablet (20 mg total) by mouth as needed. 1 qam prn fluid in the legs  . HYDROcodone-acetaminophen (NORCO/VICODIN) 5-325 MG tablet Take 1 tablet by mouth every 6 (six) hours as needed for moderate pain.   Marland Kitchen ibuprofen (ADVIL) 800 MG tablet Take 800 mg by mouth in the morning, at noon, and at bedtime.   . methotrexate 2.5 MG tablet TAKE 6 TABLETS BY MOUTH ONCE A WEEK --  CHEMOTHERAPY  PROTECT  FROM  LIGHT  . Multiple Vitamins-Minerals (CENTRUM SILVER PO) Take by mouth.  . Omega-3 Fatty Acids (SALMON OIL PO) Take 2,000 mg by mouth 2 (two) times daily.  Marland Kitchen omeprazole (PRILOSEC) 20 MG capsule Take 1 capsule by mouth once daily  . OVER THE COUNTER MEDICATION Dr. Felicie Morn foot cream  . potassium chloride  SA (KLOR-CON) 20 MEQ tablet 1 qam when using lasix  . tamsulosin (FLOMAX) 0.4 MG CAPS capsule Take 0.4 mg by mouth at bedtime.  . triamcinolone cream (KENALOG) 0.1 % Apply 1 application topically 2 (two) times daily as needed.  . vitamin C (ASCORBIC ACID) 500 MG tablet Take 500 mg by mouth daily.  . Zinc 50 MG TABS Take 50 mg by mouth daily.    No facility-administered encounter medications on file as of 01/08/2021.    Allergies (verified) Morphine and related and Acyclovir and  related   History: Past Medical History:  Diagnosis Date  . Anemia   . Basal cell carcinoma   . BPH (benign prostatic hyperplasia)   . GERD (gastroesophageal reflux disease)   . Melanoma ([place])   . Osteoarthritis   . Rheumatoid aortitis   . Rheumatoid arthritis [facility])    Past Surgical History:  Procedure Laterality Date  . CATARACT EXTRACTION W/PHACO Right 03/12/2020   Procedure: CATARACT EXTRACTION PHACO AND INTRAOCULAR LENS PLACEMENT RIGHT EYE;  Surgeon: Baruch Goldmann, MD;  Location: [facility];  Service: Ophthalmology;  Laterality: Right;  CDE: 9.99  . CATARACT EXTRACTION W/PHACO Left 03/26/2020   Procedure: CATARACT EXTRACTION PHACO AND INTRAOCULAR LENS PLACEMENT (IOC);  Surgeon: Baruch Goldmann, MD;  Location: [facility];  Service: Ophthalmology;  Laterality: Left;  CDE: 9.16   . COLONOSCOPY    . HERNIA REPAIR    . JOINT REPLACEMENT Bilateral   . KNEE ARTHROPLASTY    . MELANOMA EXCISION    . MELANOMA EXCISION  09/22/2018   on head   Family History  Problem Relation Age of Onset  . Rheum arthritis Mother   . Emphysema Father   . Cancer Brother   . Emphysema Sister    Social History   Socioeconomic History  . Marital status: Married    Spouse name: Not on file  . Number of children: Not on file  . Years of education: Not on file  . Highest education level: Not on file  Occupational History  . Not on file  Tobacco Use  . Smoking status: Never Smoker  . Smokeless tobacco: Never Used  Vaping Use  . Vaping Use: Never used  Substance and Sexual Activity  . Alcohol use: No  . Drug use: No  . Sexual activity: Not on file  Other Topics Concern  . Not on file  Social History Narrative  . Not on file   Social Determinants of Health   Financial Resource Strain: Low Risk   . Difficulty of Paying Living Expenses: Not hard at all  Food Insecurity: No Food Insecurity  . Worried About Charity fundraiser in the Last Year: Never true  . Ran Out of Food in the Last Year: Never  true  Transportation Needs: No Transportation Needs  . Lack of Transportation (Medical): No  . Lack of Transportation (Non-Medical): No  Physical Activity: Inactive  . Days of Exercise per Week: 0 days  . Minutes of Exercise per Session: 0 min  Stress: No Stress Concern Present  . Feeling of Stress : Not at all  Social Connections: Moderately Integrated  . Frequency of Communication with Friends and Family: Not on file  . Frequency of Social Gatherings with Friends and Family: More than three times a week  . Attends Religious Services: More than 4 times per year  . Active Member of Clubs or Organizations: No  . Attends Archivist Meetings: Never  . Marital Status: Married  Tobacco Counseling Counseling given: Not Answered   Clinical Intake:  Pre-visit preparation completed: Yes  Pain : No/denies pain     Nutritional Risks: None Diabetes: No  How often do you need to have someone help you when you read instructions, pamphlets, or other written materials from your doctor or pharmacy?: 1 - Never  Diabetic?No   Interpreter Needed?: No  Information entered by :: [place] of Daily Living In your present state of health, do you have any difficulty performing the following activities: 01/08/2021  Hearing? N  Vision? N  Difficulty concentrating or making decisions? N  Walking or climbing stairs? N  Dressing or bathing? N  Doing errands, shopping? N  Preparing Food and eating ? N  Using the Toilet? N  In the past six months, have you accidently leaked urine? N  Do you have problems with loss of bowel control? N  Managing your Medications? N  Managing your Finances? N  Housekeeping or managing your Housekeeping? N  Some recent data might be hidden    Patient Care Team: Kathyrn Drown, MD as PCP - General (Family Medicine) Kathyrn Drown, MD (Family Medicine)  Indicate any recent Medical Services you may have received from other than  [facility] providers in the past year (date may be approximate).     Assessment:   This is a routine wellness examination for Zadiel.  Hearing/Vision screen  Hearing Screening   125Hz  250Hz  500Hz  1000Hz  2000Hz  3000Hz  4000Hz  6000Hz  8000Hz   Right ear:           Left ear:           Vision Screening Comments: Patient states gets eyes examined once per year. Currently wearing glasses    Dietary issues and exercise activities discussed: Current Exercise Habits: The patient does not participate in regular exercise at present  Goals Addressed            This Visit's Progress   . Prevent falls        Depression Screen PHQ 2/9 Scores 01/08/2021 10/17/2019 09/07/2019 03/31/2018 09/04/2017 02/18/2016 08/15/2015  PHQ - 2 Score 0 3 4 0 2 0 0  PHQ- 9 Score - 9 7 - 5 - -    Fall Risk Fall Risk  01/08/2021 11/28/2019 10/17/2019 09/06/2019 03/31/2018  Falls in the past year? 1 0 0 0 No  Number falls in past yr: 0 - - - -  Injury with Fall? 0 - - - -  Risk for fall due to : Impaired balance/gait - - - -  Follow up Falls evaluation completed;Falls prevention discussed - Falls evaluation completed Falls evaluation completed -    FALL RISK PREVENTION PERTAINING TO THE HOME:  Any stairs in or around the home? Yes  If so, are there any without handrails? No  Home free of loose throw rugs in walkways, pet beds, electrical cords, etc? Yes  Adequate lighting in your home to reduce risk of falls? Yes   ASSISTIVE DEVICES UTILIZED TO PREVENT FALLS:  Life alert? No  Use of a cane, walker or w/c? Yes  Grab bars in the bathroom? No  Shower chair or bench in shower? Yes  Elevated toilet seat or a handicapped toilet? Yes    Cognitive Function:   Normal cognitive status assessed by direct observation by this Nurse Health Advisor. No abnormalities found.        Immunizations Immunization History  Administered Date(s) Administered  . Fluad Quad(high Dose 65+) 06/25/2020  .  Influenza Split 05/19/2013  .  Influenza, High Dose Seasonal PF 05/20/2019  . Influenza,inj,Quad PF,6+ Mos 05/22/2014, 05/22/2015, 06/06/2016, 06/10/2017, 05/14/2018  . Influenza-Unspecified 05/20/2019  . PFIZER(Purple Top)SARS-COV-2 Vaccination 11/14/2019, 12/05/2019  . Pneumococcal Conjugate-13 05/22/2014  . Pneumococcal Polysaccharide-23 05/19/2013  . Zoster Recombinat (Shingrix) 09/06/2018, 03/11/2019    TDAP status: Due, Education has been provided regarding the importance of this vaccine. Advised may receive this vaccine at local pharmacy or Health Dept. Aware to provide a copy of the vaccination record if obtained from local pharmacy or Health Dept. Verbalized acceptance and understanding.  Flu Vaccine status: Up to date  Pneumococcal vaccine status: Up to date  Covid-19 vaccine status: Completed vaccines  Qualifies for Shingles Vaccine? Yes   Zostavax completed No   Shingrix Completed?: Yes  Screening Tests Health Maintenance  Topic Date Due  . TETANUS/TDAP  Never done  . COVID-19 Vaccine (3 - Pfizer risk 4-dose series) 01/02/2020  . INFLUENZA VACCINE  04/01/2021  . PNA vac Low Risk Adult  Completed  . HPV VACCINES  Aged Out    Health Maintenance  Health Maintenance Due  Topic Date Due  . TETANUS/TDAP  Never done  . COVID-19 Vaccine (3 - Pfizer risk 4-dose series) 01/02/2020    Colorectal cancer screening: No longer required.   Lung Cancer Screening: (Low Dose CT Chest recommended if Age 47-80 years, 30 pack-year currently smoking OR have quit w/in 15years.) does not qualify.   Lung Cancer Screening Referral: N/A   Additional Screening:  Hepatitis C Screening: does not qualify;   Vision Screening: Recommended annual ophthalmology exams for early detection of glaucoma and other disorders of the eye. Is the patient up to date with their annual eye exam?  Yes  Who is the provider or what is the name of the office in which the patient attends annual eye exams? Dr. Jorja Loa If pt is not  established with a provider, would they like to be referred to a provider to establish care? No .   Dental Screening: Recommended annual dental exams for proper oral hygiene  Community Resource Referral / Chronic Care Management: CRR required this visit?  No   CCM required this visit?  No      Plan:     I have personally reviewed and noted the following in the patient's chart:   . Medical and social history . Use of alcohol, tobacco or illicit drugs  . Current medications and supplements including opioid prescriptions. Patient is not currently taking opioid prescriptions. . Functional ability and status . Nutritional status . Physical activity . Advanced directives . List of other physicians . Hospitalizations, surgeries, and ER visits in previous 12 months . Vitals . Screenings to include cognitive, depression, and falls . Referrals and appointments  In addition, I have reviewed and discussed with patient certain preventive protocols, quality metrics, and best practice recommendations. A written personalized care plan for preventive services as well as general preventive health recommendations were provided to patient.     Ofilia Neas, LPN   [id number]/61/[id number]   Nurse Notes: None

## 2021-01-08 ENCOUNTER — Ambulatory Visit (INDEPENDENT_AMBULATORY_CARE_PROVIDER_SITE_OTHER): Payer: PPO

## 2021-01-08 ENCOUNTER — Other Ambulatory Visit: Payer: Self-pay

## 2021-01-08 DIAGNOSIS — Z0000 Encounter for general adult medical examination without abnormal findings: Secondary | ICD-10-CM | POA: Diagnosis not present

## 2021-01-08 NOTE — Patient Instructions (Signed)
Bryan Maldonado , Thank you for taking time to come for your Medicare Wellness Visit. I appreciate your ongoing commitment to your health goals. Please review the following plan we discussed and let me know if I can assist you in the future.   Screening recommendations/referrals: Colonoscopy: No longer required Recommended yearly ophthalmology/optometry visit for glaucoma screening and checkup Recommended yearly dental visit for hygiene and checkup  Vaccinations: Influenza vaccine: Up to date, next due fall 80  Pneumococcal vaccine: Completed series  Tdap vaccine: Currently due, you may await and injury to receive  Shingles vaccine: Completed series     Advanced directives: Copies on file   Conditions/risks identified: None   Next appointment: 01/29/2021 @ 10:30 am with Dr. Sallee Lange  Preventive Care 81 Years and Older, Male, Male Preventive care refers to lifestyle choices and visits with your health care provider that can promote health and wellness. What does preventive care include?  A yearly physical exam. This is also called an annual well check.  Dental exams once or twice a year.  Routine eye exams. Ask your health care provider how often you should have your eyes checked.  Personal lifestyle choices, including:  Daily care of your teeth and gums.  Regular physical activity.  Eating a healthy diet.  Avoiding tobacco and drug use.  Limiting alcohol use.  Practicing safe sex.  Taking low doses of aspirin every day.  Taking vitamin and mineral supplements as recommended by your health care provider. What happens during an annual well check? The services and screenings done by your health care provider during your annual well check will depend on your age, overall health, lifestyle risk factors, and family history of disease. Counseling  Your health care provider may ask you questions about your:  Alcohol use.  Tobacco use.  Drug use.  Emotional  well-being.  Home and relationship well-being.  Sexual activity.  Eating habits.  History of falls.  Memory and ability to understand (cognition).  Work and work Statistician. Screening  You may have the following tests or measurements:  Height, weight, and BMI.  Blood pressure.  Lipid and cholesterol levels. These may be checked every 5 years, or more frequently if you are over 81 years old.  Skin check.  Lung cancer screening. You may have this screening every year starting at age 81 if you have a 30-pack-year history of smoking and currently smoke or have quit within the past 15 years.  Fecal occult blood test (FOBT) of the stool. You may have this test every year starting at age 81.  Flexible sigmoidoscopy or colonoscopy. You may have a sigmoidoscopy every 5 years or a colonoscopy every 10 years starting at age 81.  Prostate cancer screening. Recommendations will vary depending on your family history and other risks.  Hepatitis C blood test.  Hepatitis B blood test.  Sexually transmitted disease (STD) testing.  Diabetes screening. This is done by checking your blood sugar (glucose) after you have not eaten for a while (fasting). You may have this done every 1-3 years.  Abdominal aortic aneurysm (AAA) screening. You may need this if you are a current or former smoker.  Osteoporosis. You may be screened starting at age 81 if you are at high risk. Talk with your health care provider about your test results, treatment options, and if necessary, the need for more tests. Vaccines  Your health care provider may recommend certain vaccines, such as:  Influenza vaccine. This is recommended every year.  Tetanus, diphtheria,  and acellular pertussis (Tdap, Td) vaccine. You may need a Td booster every 10 years.  Zoster vaccine. You may need this after age 50.  Pneumococcal 13-valent conjugate (PCV13) vaccine. One dose is recommended after age 14.  Pneumococcal  polysaccharide (PPSV23) vaccine. One dose is recommended after age 1. Talk to your health care provider about which screenings and vaccines you need and how often you need them. This information is not intended to replace advice given to you by your health care provider. Make sure you discuss any questions you have with your health care provider. Document Released: 09/14/2015 Document Revised: 05/07/2016 Document Reviewed: 06/19/2015 Elsevier Interactive Patient Education  2017 [place] Prevention in the Home Falls can cause injuries. They can happen to people of all ages. There are many things you can do to make your home safe and to help prevent falls. What can I do on the outside of my home?  Regularly fix the edges of walkways and driveways and fix any cracks.  Remove anything that might make you trip as you walk through a door, such as a raised step or threshold.  Trim any bushes or trees on the path to your home.  Use bright outdoor lighting.  Clear any walking paths of anything that might make someone trip, such as rocks or tools.  Regularly check to see if handrails are loose or broken. Make sure that both sides of any steps have handrails.  Any raised decks and porches should have guardrails on the edges.  Have any leaves, snow, or ice cleared regularly.  Use sand or salt on walking paths during winter.  Clean up any spills in your garage right away. This includes oil or grease spills. What can I do in the bathroom?  Use night lights.  Install grab bars by the toilet and in the tub and shower. Do not use towel bars as grab bars.  Use non-skid mats or decals in the tub or shower.  If you need to sit down in the shower, use a plastic, non-slip stool.  Keep the floor dry. Clean up any water that spills on the floor as soon as it happens.  Remove soap buildup in the tub or shower regularly.  Attach bath mats securely with double-sided non-slip rug  tape.  Do not have throw rugs and other things on the floor that can make you trip. What can I do in the bedroom?  Use night lights.  Make sure that you have a light by your bed that is easy to reach.  Do not use any sheets or blankets that are too big for your bed. They should not hang down onto the floor.  Have a firm chair that has side arms. You can use this for support while you get dressed.  Do not have throw rugs and other things on the floor that can make you trip. What can I do in the kitchen?  Clean up any spills right away.  Avoid walking on wet floors.  Keep items that you use a lot in easy-to-reach places.  If you need to reach something above you, use a strong step stool that has a grab bar.  Keep electrical cords out of the way.  Do not use floor polish or wax that makes floors slippery. If you must use wax, use non-skid floor wax.  Do not have throw rugs and other things on the floor that can make you trip. What can I do with my  stairs?  Do not leave any items on the stairs.  Make sure that there are handrails on both sides of the stairs and use them. Fix handrails that are broken or loose. Make sure that handrails are as long as the stairways.  Check any carpeting to make sure that it is firmly attached to the stairs. Fix any carpet that is loose or worn.  Avoid having throw rugs at the top or bottom of the stairs. If you do have throw rugs, attach them to the floor with carpet tape.  Make sure that you have a light switch at the top of the stairs and the bottom of the stairs. If you do not have them, ask someone to add them for you. What else can I do to help prevent falls?  Wear shoes that:  Do not have high heels.  Have rubber bottoms.  Are comfortable and fit you well.  Are closed at the toe. Do not wear sandals.  If you use a stepladder:  Make sure that it is fully opened. Do not climb a closed stepladder.  Make sure that both sides of the  stepladder are locked into place.  Ask someone to hold it for you, if possible.  Clearly mark and make sure that you can see:  Any grab bars or handrails.  First and last steps.  Where the edge of each step is.  Use tools that help you move around (mobility aids) if they are needed. These include:  Canes.  Walkers.  Scooters.  Crutches.  Turn on the lights when you go into a dark area. Replace any light bulbs as soon as they burn out.  Set up your furniture so you have a clear path. Avoid moving your furniture around.  If any of your floors are uneven, fix them.  If there are any pets around you, be aware of where they are.  Review your medicines with your doctor. Some medicines can make you feel dizzy. This can increase your chance of falling. Ask your doctor what other things that you can do to help prevent falls. This information is not intended to replace advice given to you by your health care provider. Make sure you discuss any questions you have with your health care provider. Document Released: 06/14/2009 Document Revised: 01/24/2016 Document Reviewed: 09/22/2014 Elsevier Interactive Patient Education  2017 [employer].

## 2021-01-12 ENCOUNTER — Other Ambulatory Visit: Payer: Self-pay | Admitting: Family Medicine

## 2021-01-14 ENCOUNTER — Other Ambulatory Visit: Payer: Self-pay

## 2021-01-14 DIAGNOSIS — Z79899 Other long term (current) drug therapy: Secondary | ICD-10-CM | POA: Diagnosis not present

## 2021-01-14 NOTE — Telephone Encounter (Signed)
Check up 06/25/20. Has appt this month

## 2021-01-15 LAB — COMPLETE METABOLIC PANEL WITH GFR
AG Ratio: 1.6 (calc) (ref 1.0–2.5)
ALT: 12 U/L (ref 9–46)
AST: 18 U/L (ref 10–35)
Albumin: 4.1 g/dL (ref 3.6–5.1)
Alkaline phosphatase (APISO): 66 U/L (ref 35–144)
BUN: 17 mg/dL (ref 7–25)
CO2: 27 mmol/L (ref 20–32)
Calcium: 9.2 mg/dL (ref 8.6–10.3)
Chloride: 104 mmol/L (ref 98–110)
Creat: 0.84 mg/dL (ref 0.70–1.11)
GFR, Est African American: 96 mL/min/{1.73_m2} (ref >=60)
GFR, Est Non African American: 83 mL/min/{1.73_m2} (ref >=60)
Globulin: 2.5 g/dL (calc) (ref 1.9–3.7)
Glucose, Bld: 113 mg/dL — ABNORMAL HIGH (ref 65–99)
Potassium: 4.5 mmol/L (ref 3.5–5.3)
Sodium: 139 mmol/L (ref 135–146)
Total Bilirubin: 0.8 mg/dL (ref 0.2–1.2)
Total Protein: 6.6 g/dL (ref 6.1–8.1)

## 2021-01-15 LAB — CBC WITH DIFFERENTIAL/PLATELET
Absolute Monocytes: 485 cells/uL (ref 200–950)
Basophils Absolute: 32 cells/uL (ref 0–200)
Basophils Relative: 0.5 %
Eosinophils Absolute: 139 cells/uL (ref 15–500)
Eosinophils Relative: 2.2 %
HCT: 34.6 % — ABNORMAL LOW (ref 38.5–50.0)
Hemoglobin: 11.4 g/dL — ABNORMAL LOW (ref 13.2–17.1)
Lymphs Abs: 2684 cells/uL (ref 850–3900)
MCH: 32.8 pg (ref 27.0–33.0)
MCHC: 32.9 g/dL (ref 32.0–36.0)
MCV: 99.4 fL (ref 80.0–100.0)
MPV: 9.9 fL (ref 7.5–12.5)
Monocytes Relative: 7.7 %
Neutro Abs: 2961 cells/uL (ref 1500–7800)
Neutrophils Relative %: 47 %
Platelets: 178 10*3/uL (ref 140–400)
RBC: 3.48 10*6/uL — ABNORMAL LOW (ref 4.20–5.80)
RDW: 13.6 % (ref 11.0–15.0)
Total Lymphocyte: 42.6 %
WBC: 6.3 10*3/uL (ref 3.8–10.8)

## 2021-01-15 NOTE — Progress Notes (Signed)
Glucose is elevated, probably not a fasting sample.  Anemia is a stable.

## 2021-01-22 DIAGNOSIS — Z08 Encounter for follow-up examination after completed treatment for malignant neoplasm: Secondary | ICD-10-CM | POA: Diagnosis not present

## 2021-01-22 DIAGNOSIS — Z85820 Personal history of malignant melanoma of skin: Secondary | ICD-10-CM | POA: Diagnosis not present

## 2021-01-22 DIAGNOSIS — Z1283 Encounter for screening for malignant neoplasm of skin: Secondary | ICD-10-CM | POA: Diagnosis not present

## 2021-01-22 DIAGNOSIS — L570 Actinic keratosis: Secondary | ICD-10-CM | POA: Diagnosis not present

## 2021-01-22 DIAGNOSIS — D225 Melanocytic nevi of trunk: Secondary | ICD-10-CM | POA: Diagnosis not present

## 2021-01-22 DIAGNOSIS — X32XXXD Exposure to sunlight, subsequent encounter: Secondary | ICD-10-CM | POA: Diagnosis not present

## 2021-01-23 NOTE — Progress Notes (Deleted)
Office Visit Note  Patient: Bryan Boike Lauro Sr.             Date of Birth: 05/03/1941           MRN: [id number]             PCP: Kathyrn Drown, MD Referring: Kathyrn Drown, MD Visit Date: 02/06/2021 Occupation: @GUAROCC @  Subjective:  No chief complaint on file.   History of Present Illness: Bryan MARCZAK Sr. is a 80 y.o. male ***   Activities of Daily Living:  Patient reports morning stiffness for *** {minute/hour:19697}.   Patient {ACTIONS;DENIES/REPORTS:21021675::"Denies"} nocturnal pain.  Difficulty dressing/grooming: {ACTIONS;DENIES/REPORTS:21021675::"Denies"} Difficulty climbing stairs: {ACTIONS;DENIES/REPORTS:21021675::"Denies"} Difficulty getting out of chair: {ACTIONS;DENIES/REPORTS:21021675::"Denies"} Difficulty using hands for taps, buttons, cutlery, and/or writing: {ACTIONS;DENIES/REPORTS:21021675::"Denies"}  No Rheumatology ROS completed.   PMFS History:  Patient Active Problem List   Diagnosis Date Noted  . Anemia, chronic disease 06/25/2020  . High risk medication use 09/19/2016  . History of total knee replacement, bilateral 09/19/2016  . Gastroesophageal reflux disease without esophagitis 09/19/2016  . History of melanoma 09/19/2016  . History of basal cell carcinoma 09/19/2016  . History of anemia 09/19/2016  . Allergic rhinitis 02/18/2016  . Melanoma of skin ([place]) 06/06/2015  . Prediabetes 11/20/2014  . Hyperlipidemia 11/20/2014  . BPH (benign prostatic hyperplasia) 05/22/2014  . Rheumatoid arthritis involving multiple sites with positive rheumatoid factor ([place]) 01/05/2013  . Chronic pain syndrome 01/05/2013    Past Medical History:  Diagnosis Date  . Anemia   . Basal cell carcinoma   . BPH (benign prostatic hyperplasia)   . GERD (gastroesophageal reflux disease)   . Melanoma ([place])   . Osteoarthritis   . Rheumatoid aortitis   . Rheumatoid arthritis ([place])     Family History  Problem Relation Age of Onset  . Rheum arthritis Mother   .  Emphysema Father   . Cancer Brother   . Emphysema Sister    Past Surgical History:  Procedure Laterality Date  . CATARACT EXTRACTION W/PHACO Right 03/12/2020   Procedure: CATARACT EXTRACTION PHACO AND INTRAOCULAR LENS PLACEMENT RIGHT EYE;  Surgeon: Baruch Goldmann, MD;  Location: [facility];  Service: Ophthalmology;  Laterality: Right;  CDE: 9.99  . CATARACT EXTRACTION W/PHACO Left 03/26/2020   Procedure: CATARACT EXTRACTION PHACO AND INTRAOCULAR LENS PLACEMENT (IOC);  Surgeon: Baruch Goldmann, MD;  Location: [facility];  Service: Ophthalmology;  Laterality: Left;  CDE: 9.16   . COLONOSCOPY    . HERNIA REPAIR    . JOINT REPLACEMENT Bilateral   . KNEE ARTHROPLASTY    . MELANOMA EXCISION    . MELANOMA EXCISION  09/22/2018   on head   Social History   Social History Narrative  . Not on file   Immunization History  Administered Date(s) Administered  . Fluad Quad(high Dose 65+) 06/25/2020  . Influenza Split 05/19/2013  . Influenza, High Dose Seasonal PF 05/20/2019  . Influenza,inj,Quad PF,6+ Mos 05/22/2014, 05/22/2015, 06/06/2016, 06/10/2017, 05/14/2018  . Influenza-Unspecified 05/20/2019  . PFIZER(Purple Top)SARS-COV-2 Vaccination 11/14/2019, 12/05/2019  . Pneumococcal Conjugate-13 05/22/2014  . Pneumococcal Polysaccharide-23 05/19/2013  . Zoster Recombinat (Shingrix) 09/06/2018, 03/11/2019     Objective: Vital Signs: There were no vitals taken for this visit.   Physical Exam   Musculoskeletal Exam: ***  CDAI Exam: CDAI Score: -- Patient Global: --; Provider Global: -- Swollen: --; Tender: -- Joint Exam 02/06/2021   No joint exam has been documented for this visit   There is currently no information documented on the  homunculus. Go to the Rheumatology activity and complete the homunculus joint exam.  Investigation: No additional findings.  Imaging: No results found.  Recent Labs: Lab Results  Component Value Date   WBC 6.3 01/14/2021   HGB 11.4 (L) 01/14/2021   PLT  178 01/14/2021   NA 139 01/14/2021   K 4.5 01/14/2021   CL 104 01/14/2021   CO2 27 01/14/2021   GLUCOSE 113 (H) 01/14/2021   BUN 17 01/14/2021   CREATININE 0.84 01/14/2021   BILITOT 0.8 01/14/2021   ALKPHOS 76 10/10/2019   AST 18 01/14/2021   ALT 12 01/14/2021   PROT 6.6 01/14/2021   ALBUMIN 4.5 10/10/2019   CALCIUM 9.2 01/14/2021   GFRAA 96 01/14/2021    Speciality Comments: No specialty comments available.  Procedures:  No procedures performed Allergies: Morphine and related and Acyclovir and related   Assessment / Plan:     Visit Diagnoses: No diagnosis found.  Orders: No orders of the defined types were placed in this encounter.  No orders of the defined types were placed in this encounter.   Face-to-face time spent with patient was *** minutes. Greater than 50% of time was spent in counseling and coordination of care.  Follow-Up Instructions: No follow-ups on file.   Earnestine Mealing, CMA  Note - This record has been created using Editor, commissioning.  Chart creation errors have been sought, but may not always  have been located. Such creation errors do not reflect on  the standard of medical care.

## 2021-01-29 ENCOUNTER — Other Ambulatory Visit: Payer: Self-pay

## 2021-01-29 ENCOUNTER — Encounter: Payer: Self-pay | Admitting: Family Medicine

## 2021-01-29 ENCOUNTER — Ambulatory Visit (INDEPENDENT_AMBULATORY_CARE_PROVIDER_SITE_OTHER): Payer: PPO | Admitting: Family Medicine

## 2021-01-29 VITALS — BP 143/73 | HR 59 | Temp 98.4°F | Ht 67.5 in | Wt 184.0 lb

## 2021-01-29 DIAGNOSIS — D692 Other nonthrombocytopenic purpura: Secondary | ICD-10-CM | POA: Diagnosis not present

## 2021-01-29 DIAGNOSIS — E782 Mixed hyperlipidemia: Secondary | ICD-10-CM

## 2021-01-29 DIAGNOSIS — Z0000 Encounter for general adult medical examination without abnormal findings: Secondary | ICD-10-CM

## 2021-01-29 MED ORDER — ATORVASTATIN CALCIUM 10 MG PO TABS: 1.0000 | ORAL_TABLET | Freq: Every day | ORAL | 1 refills | Status: DC

## 2021-01-29 MED ORDER — POTASSIUM CHLORIDE CRYS ER 20 MEQ PO TBCR: EXTENDED_RELEASE_TABLET | ORAL | 5 refills | Status: DC

## 2021-01-29 MED ORDER — FUROSEMIDE 20 MG PO TABS: 20.0000 mg | ORAL_TABLET | ORAL | 1 refills | Status: DC | PRN

## 2021-01-29 MED ORDER — FLUOXETINE HCL 10 MG PO CAPS: 10.0000 mg | ORAL_CAPSULE | Freq: Every day | ORAL | 5 refills | Status: DC

## 2021-01-29 MED ORDER — OMEPRAZOLE 20 MG PO CPDR: 1.0000 | DELAYED_RELEASE_CAPSULE | Freq: Every day | ORAL | 1 refills | Status: DC

## 2021-01-29 NOTE — Progress Notes (Signed)
   Subjective:    Patient ID: Bryan Homans Sr., male    DOB: 11/18/1940, 80 y.o.   MRN: [id number] The patient's past medical history, surgical history, and family history were reviewed. Pertinent vaccines were reviewed ( tetanus, pneumonia, shingles, flu) The patient's medication list was reviewed and updated.  The height and weight were entered.  BMI recorded in electronic record elsewhere  Cognitive screening was completed. Outcome of Mini - Cog: failed. Gave montreal cognitive assessment   Falls /depression screening electronically recorded within record elsewhere  Current tobacco usage: none (All patients who use tobacco were given written and verbal information on quitting)  Recent listing of emergency department/hospitalizations over the past year were reviewed.  current specialist the patient sees on a regular basis: Dr. Joellyn Haff  Appropriate discussion of followup regarding next visit was discussed.    Hyperlipidemia This is a chronic problem. Pertinent negatives include no chest pain or shortness of breath. Treatments tried: atorvastatin  There are no compliance problems (takes med every day, tries to eat healthy, works in garden for exercise).    Swelling in legs. Off and on.   Pain in right little toe. Happens when up on feet a lot.    Review of Systems  Constitutional: Negative for activity change, fatigue and fever.  HENT: Negative for congestion and rhinorrhea.   Respiratory: Negative for cough and shortness of breath.   Cardiovascular: Negative for chest pain and leg swelling.  Gastrointestinal: Negative for abdominal pain, diarrhea and nausea.  Genitourinary: Negative for dysuria and hematuria.  Neurological: Negative for weakness and headaches.  Psychiatric/Behavioral: Negative for agitation and behavioral problems.       Objective:   Physical Exam Lungs are clear heart is regular severe rheumatoid arthritis noted abdomen soft   [name]  noted on the arms    Assessment & Plan:  Adult wellness-complete.wellness physical was conducted today. Importance of diet and exercise were discussed in detail.  In addition to this a discussion regarding safety was also covered. We also reviewed over immunizations and gave recommendations regarding current immunization needed for age.  In addition to this additional areas were also touched on including: Preventative health exams needed:  Colonoscopy not indicated  Patient was advised yearly wellness exam

## 2021-01-29 NOTE — Patient Instructions (Signed)
Get your second booster for covid 6 months after your last booster  Go to [facility] and they will help set this up Thanks- Dr Nicki Reaper

## 2021-02-06 ENCOUNTER — Ambulatory Visit: Payer: PPO | Admitting: Rheumatology

## 2021-02-06 DIAGNOSIS — R7303 Prediabetes: Secondary | ICD-10-CM

## 2021-02-06 DIAGNOSIS — Z8719 Personal history of other diseases of the digestive system: Secondary | ICD-10-CM

## 2021-02-06 DIAGNOSIS — Z8639 Personal history of other endocrine, nutritional and metabolic disease: Secondary | ICD-10-CM

## 2021-02-06 DIAGNOSIS — Z13820 Encounter for screening for osteoporosis: Secondary | ICD-10-CM

## 2021-02-06 DIAGNOSIS — G894 Chronic pain syndrome: Secondary | ICD-10-CM

## 2021-02-06 DIAGNOSIS — Z96653 Presence of artificial knee joint, bilateral: Secondary | ICD-10-CM

## 2021-02-06 DIAGNOSIS — Z862 Personal history of diseases of the blood and blood-forming organs and certain disorders involving the immune mechanism: Secondary | ICD-10-CM

## 2021-02-06 DIAGNOSIS — M0579 Rheumatoid arthritis with rheumatoid factor of multiple sites without organ or systems involvement: Secondary | ICD-10-CM

## 2021-02-06 DIAGNOSIS — Z79899 Other long term (current) drug therapy: Secondary | ICD-10-CM

## 2021-02-06 DIAGNOSIS — Z85820 Personal history of malignant melanoma of skin: Secondary | ICD-10-CM

## 2021-02-06 DIAGNOSIS — Z85828 Personal history of other malignant neoplasm of skin: Secondary | ICD-10-CM

## 2021-02-08 NOTE — Progress Notes (Signed)
Office Visit Note  Patient: Bryan Harr Wolfrey Sr.             Date of Birth: 05/11/1941           MRN: [id number]             PCP: Kathyrn Drown, MD Referring: Kathyrn Drown, MD Visit Date: 02/13/2021 Occupation: @GUAROCC @  Subjective:  Medication monitoring  History of Present Illness: Bryan HUR Sr. is a 80 y.o. male with history of seropositive rheumatoid arthritis.  Patient is taking methotrexate 6 tablets by mouth once weekly and folic acid 2 mg by mouth daily.  He denies any recent rheumatoid arthritis flares.  He states that he experiences increased pain in both hands and both feet with overuse activities.  He is having some stiffness in both hands which she attributes to not doing much this morning prior to his appointment.  He states both knee replacements are doing well.  He denies any increased neck or lower back pain at this time. He denies any recent infections.    Activities of Daily Living:  Patient reports morning stiffness for 15-20 minutes.   Patient Denies nocturnal pain.  Difficulty dressing/grooming: Reports Difficulty climbing stairs: Denies Difficulty getting out of chair: Denies Difficulty using hands for taps, buttons, cutlery, and/or writing: Reports  Review of Systems  Constitutional:  Positive for fatigue.  HENT:  Positive for mouth dryness and nose dryness. Negative for mouth sores.   Eyes:  Positive for dryness. Negative for pain and itching.  Respiratory:  Negative for shortness of breath and difficulty breathing.   Cardiovascular:  Positive for swelling in legs/feet. Negative for chest pain and palpitations.  Gastrointestinal:  Negative for blood in stool, constipation and diarrhea.  Endocrine: Negative for increased urination.  Genitourinary:  Negative for difficulty urinating.  Musculoskeletal:  Positive for morning stiffness. Negative for joint pain, joint pain, joint swelling, myalgias, muscle tenderness and myalgias.  Skin:  Negative for  color change, rash and redness.  Allergic/Immunologic: Negative for susceptible to infections.  Neurological:  Negative for dizziness, numbness, headaches, memory loss and weakness.  Hematological:  Positive for bruising/bleeding tendency.  Psychiatric/Behavioral:  Negative for confusion.    PMFS History:  Patient Active Problem List   Diagnosis Date Noted   [name] ([place]) 01/29/2021   Anemia, chronic disease 06/25/2020   High risk medication use 09/19/2016   History of total knee replacement, bilateral 09/19/2016   Gastroesophageal reflux disease without esophagitis 09/19/2016   History of melanoma 09/19/2016   History of basal cell carcinoma 09/19/2016   History of anemia 09/19/2016   Allergic rhinitis 02/18/2016   Melanoma of skin ([place]) 06/06/2015   Prediabetes 11/20/2014   Hyperlipidemia 11/20/2014   BPH (benign prostatic hyperplasia) 05/22/2014   Rheumatoid arthritis involving multiple sites with positive rheumatoid factor ([place]) 01/05/2013   Chronic pain syndrome 01/05/2013    Past Medical History:  Diagnosis Date   Anemia    Basal cell carcinoma    BPH (benign prostatic hyperplasia)    GERD (gastroesophageal reflux disease)    Melanoma ([facility])    Osteoarthritis    Rheumatoid aortitis    Rheumatoid arthritis ([place])     Family History  Problem Relation Age of Onset   Rheum arthritis Mother    Emphysema Father    Cancer Brother    Emphysema Sister    Past Surgical History:  Procedure Laterality Date   CATARACT EXTRACTION W/PHACO Right 03/12/2020   Procedure: CATARACT EXTRACTION  PHACO AND INTRAOCULAR LENS PLACEMENT RIGHT EYE;  Surgeon: Baruch Goldmann, MD;  Location: [facility];  Service: Ophthalmology;  Laterality: Right;  CDE: 9.99   CATARACT EXTRACTION W/PHACO Left 03/26/2020   Procedure: CATARACT EXTRACTION PHACO AND INTRAOCULAR LENS PLACEMENT (IOC);  Surgeon: Baruch Goldmann, MD;  Location: [facility];  Service: Ophthalmology;  Laterality: Left;  CDE: 9.16     COLONOSCOPY     HERNIA REPAIR     JOINT REPLACEMENT Bilateral    KNEE ARTHROPLASTY     MELANOMA EXCISION     MELANOMA EXCISION  09/22/2018   on head   Social History   Social History Narrative   Not on file   Immunization History  Administered Date(s) Administered   Fluad Quad(high Dose 65+) 06/25/2020   Influenza Split 05/19/2013   Influenza, High Dose Seasonal PF 05/20/2019   Influenza,inj,Quad PF,6+ Mos 05/22/2014, 05/22/2015, 06/06/2016, 06/10/2017, 05/14/2018   Influenza-Unspecified 05/20/2019   PFIZER(Purple Top)SARS-COV-2 Vaccination 11/14/2019, 12/05/2019   Pneumococcal Conjugate-13 05/22/2014   Pneumococcal Polysaccharide-23 05/19/2013   Zoster Recombinat (Shingrix) 09/06/2018, 03/11/2019     Objective: Vital Signs: BP 125/70 (BP Location: Left Arm, Patient Position: Sitting, Cuff Size: Normal)   Pulse 65   Resp 16   Ht 5\' 7"  (1.702 m)   Wt 184 lb 9.6 oz (83.7 kg)   BMI 28.91 kg/m    Physical Exam Vitals and nursing note reviewed.  Constitutional:      Appearance: He is well-developed.  HENT:     Head: Normocephalic and atraumatic.  Eyes:     Conjunctiva/sclera: Conjunctivae normal.     Pupils: Pupils are equal, round, and reactive to light.  Pulmonary:     Effort: Pulmonary effort is normal.  Abdominal:     Palpations: Abdomen is soft.  Musculoskeletal:     Cervical back: Normal range of motion and neck supple.  Skin:    General: Skin is warm and dry.     Capillary Refill: Capillary refill takes less than 2 seconds.  Neurological:     Mental Status: He is alert and oriented to person, place, and time.  Psychiatric:        Behavior: Behavior normal.     Musculoskeletal Exam: C-spine limited ROM with lateral rotation.  Postural thoracic kyphosis.  No midline spinal tenderness.  Shoulder joint abduction to about 100 degrees.  Elbow joints have mild flexion contractures but no tenderness or inflammation noted.  Synovial thickening and limited ROM of  both wrist joints.  Thickening of all MCPs.  Swan neck deformities noted.  DIP prominence. Bilateral knee replacements have good ROM with no discomfort.  Subluxation and thickening of both ankle joints   CDAI Exam: CDAI Score: -- Patient Global: --; Provider Global: -- Swollen: --; Tender: -- Joint Exam 02/13/2021   No joint exam has been documented for this visit   There is currently no information documented on the homunculus. Go to the Rheumatology activity and complete the homunculus joint exam.  Investigation: No additional findings.  Imaging: No results found.  Recent Labs: Lab Results  Component Value Date   WBC 6.3 01/14/2021   HGB 11.4 (L) 01/14/2021   PLT 178 01/14/2021   NA 139 01/14/2021   K 4.5 01/14/2021   CL 104 01/14/2021   CO2 27 01/14/2021   GLUCOSE 113 (H) 01/14/2021   BUN 17 01/14/2021   CREATININE 0.84 01/14/2021   BILITOT 0.8 01/14/2021   ALKPHOS 76 10/10/2019   AST 18 01/14/2021   ALT 12 01/14/2021  PROT 6.6 01/14/2021   ALBUMIN 4.5 10/10/2019   CALCIUM 9.2 01/14/2021   GFRAA 96 01/14/2021    Speciality Comments: No specialty comments available.  Procedures:  No procedures performed Allergies: Morphine and related and Acyclovir and related   Assessment / Plan:     Visit Diagnoses: Rheumatoid arthritis involving multiple sites with positive rheumatoid factor ([facility]CC) - Positive rheumatoid factor, positive anti-CCP, severe erosive disease with nodulosis and contractures: He has no joint tenderness or synovitis on examination today.  Synovial thickening over both wrist joints and all MCP joints noted.  He has not had any recent rheumatoid arthritis flares.  He experiences occasional discomfort in both hands of both feet typically after overuse activities or standing for prolonged periods of time.  He takes Tylenol 3 or Tylenol arthritis as needed for pain relief.  His rheumatoid arthritis has been clinically stable on methotrexate 6 tablets once  weekly.  X-rays of both hands and feet were updated on 05/07/18 and there was no interval change when compared to x-rays from 2012.  He will remain on MTX and folic acid as prescribed.  A refill of MTX was sent to the pharmacy.  He was advised to notify us if he develops signs or symptoms of a flare.  He will follow up in 5 months.  High risk medication use - MTX 2.5 mg 6 tablets by mouth every 7 days and folic acid 1 mg 2 tablets daily. CBC and CMP updated on 01/14/2021.  He will be due to update lab work in August and every 3 months to monitor for drug toxicity.  Standing orders for CBC and CMP remain in place. He has not had any recent infections.  Discussed the importance of holding methotrexate if he develops signs or symptoms of an infection and to resume once the infection has completely cleared. He has no new questions or concerns.   History of total knee replacement, bilateral: Doing well.  He has good range of motion with no discomfort at this time.  Chronic pain syndrome - He takes tylenol #3 or tylenol arthritis for pain relief.  Other medical conditions are listed as follows:  Prediabetes  History of melanoma  History of hyperlipidemia  History of basal cell carcinoma  History of anemia  History of gastroesophageal reflux (GERD)  Osteoporosis screening - Followed by PCP.  Dr. Wolfgang Phoenix placed order.    Orders: No orders of the defined types were placed in this encounter.  Meds ordered this encounter  Medications   methotrexate 2.5 MG tablet    Sig: TAKE 6 TABLETS BY MOUTH ONCE A WEEK --  CHEMOTHERAPY  PROTECT  FROM  LIGHT    Dispense:  72 tablet    Refill:  0     Follow-Up Instructions: Return in about 5 months (around 07/16/2021) for Rheumatoid arthritis.   Ofilia Neas, PA-C  Note - This record has been created using Dragon software.  Chart creation errors have been sought, but may not always  have been located. Such creation errors do not reflect on  the  standard of medical care.

## 2021-02-13 ENCOUNTER — Ambulatory Visit: Payer: PPO | Admitting: Physician Assistant

## 2021-02-13 ENCOUNTER — Encounter: Payer: Self-pay | Admitting: Physician Assistant

## 2021-02-13 ENCOUNTER — Other Ambulatory Visit: Payer: Self-pay

## 2021-02-13 VITALS — BP 125/70 | HR 65 | Resp 16 | Ht 67.0 in | Wt 184.6 lb

## 2021-02-13 DIAGNOSIS — Z85820 Personal history of malignant melanoma of skin: Secondary | ICD-10-CM

## 2021-02-13 DIAGNOSIS — Z85828 Personal history of other malignant neoplasm of skin: Secondary | ICD-10-CM | POA: Diagnosis not present

## 2021-02-13 DIAGNOSIS — Z13820 Encounter for screening for osteoporosis: Secondary | ICD-10-CM

## 2021-02-13 DIAGNOSIS — Z8719 Personal history of other diseases of the digestive system: Secondary | ICD-10-CM

## 2021-02-13 DIAGNOSIS — Z862 Personal history of diseases of the blood and blood-forming organs and certain disorders involving the immune mechanism: Secondary | ICD-10-CM

## 2021-02-13 DIAGNOSIS — Z96653 Presence of artificial knee joint, bilateral: Secondary | ICD-10-CM

## 2021-02-13 DIAGNOSIS — Z79899 Other long term (current) drug therapy: Secondary | ICD-10-CM

## 2021-02-13 DIAGNOSIS — R7303 Prediabetes: Secondary | ICD-10-CM

## 2021-02-13 DIAGNOSIS — M0579 Rheumatoid arthritis with rheumatoid factor of multiple sites without organ or systems involvement: Secondary | ICD-10-CM

## 2021-02-13 DIAGNOSIS — Z8639 Personal history of other endocrine, nutritional and metabolic disease: Secondary | ICD-10-CM | POA: Diagnosis not present

## 2021-02-13 DIAGNOSIS — G894 Chronic pain syndrome: Secondary | ICD-10-CM

## 2021-02-13 MED ORDER — METHOTREXATE SODIUM 2.5 MG PO TABS: ORAL_TABLET | ORAL | 0 refills | Status: DC

## 2021-02-13 NOTE — Patient Instructions (Signed)
Standing Labs We placed an order today for your standing lab work.   Please have your standing labs drawn in August and every 3 months   If possible, please have your labs drawn 2 weeks prior to your appointment so that the provider can discuss your results at your appointment.  Please note that you may see your imaging and lab results in MyChart before we have reviewed them. We may be awaiting multiple results to interpret others before contacting you. Please allow our office up to 72 hours to thoroughly review all of the results before contacting the office for clarification of your results.  We have open lab daily: Monday through Thursday from 1:30-4:30 PM and Friday from 1:30-4:00 PM at the office of Dr. Shaili Deveshwar, [facility].   Please be advised, all patients with office appointments requiring lab work will take precedent over walk-in lab work.  If possible, please come for your lab work on Monday and Friday afternoons, as you may experience shorter wait times. The office is located at [address], [address], [place],  [address] No appointment is necessary.   Labs are drawn by [facility]. Please bring your co-pay at the time of your lab draw.  You may receive a bill from [facility] for your lab work.  If you wish to have your labs drawn at another location, please call the office 24 hours in advance to send orders.  If you have any questions regarding directions or hours of operation,  please call [phone].   As a reminder, please drink plenty of water prior to coming for your lab work. Thanks!  

## 2021-02-19 ENCOUNTER — Other Ambulatory Visit: Payer: Self-pay | Admitting: Family Medicine

## 2021-04-10 ENCOUNTER — Other Ambulatory Visit: Payer: Self-pay | Admitting: Family Medicine

## 2021-04-10 NOTE — Telephone Encounter (Signed)
Needs to schedule follow-up visit for either later this month or early September then I can fill the prescription

## 2021-04-11 NOTE — Telephone Encounter (Signed)
Please contact patient to schedule appt. Thank you!

## 2021-04-19 ENCOUNTER — Other Ambulatory Visit: Payer: Self-pay | Admitting: Family Medicine

## 2021-04-19 ENCOUNTER — Telehealth: Payer: Self-pay | Admitting: *Deleted

## 2021-04-19 DIAGNOSIS — E782 Mixed hyperlipidemia: Secondary | ICD-10-CM

## 2021-04-19 DIAGNOSIS — R7303 Prediabetes: Secondary | ICD-10-CM

## 2021-04-19 DIAGNOSIS — Z79899 Other long term (current) drug therapy: Secondary | ICD-10-CM

## 2021-04-19 MED ORDER — FUROSEMIDE 20 MG PO TABS: 20.0000 mg | ORAL_TABLET | ORAL | 1 refills | Status: DC | PRN

## 2021-04-19 NOTE — Telephone Encounter (Signed)
Furosemide was refilled as requested Metabolic 7, lipid profile before next follow-up visit in 3 months Have patient set up follow-up in 3 months thank you sooner if any problems

## 2021-04-19 NOTE — Telephone Encounter (Signed)
Blood work ordered in [employer]. Telephone call-mailbox is full

## 2021-04-19 NOTE — Telephone Encounter (Signed)
"  Fluid pill"

## 2021-04-24 NOTE — Telephone Encounter (Signed)
Pt contacted and verbalized understanding.  

## 2021-04-24 NOTE — Telephone Encounter (Signed)
Patient schedule appointment for 9/19

## 2021-04-26 DIAGNOSIS — E782 Mixed hyperlipidemia: Secondary | ICD-10-CM | POA: Diagnosis not present

## 2021-04-26 DIAGNOSIS — R7303 Prediabetes: Secondary | ICD-10-CM | POA: Diagnosis not present

## 2021-04-26 DIAGNOSIS — Z79899 Other long term (current) drug therapy: Secondary | ICD-10-CM | POA: Diagnosis not present

## 2021-04-27 ENCOUNTER — Encounter: Payer: Self-pay | Admitting: Family Medicine

## 2021-04-27 LAB — LIPID PANEL
Chol/HDL Ratio: 2.2 ratio (ref 0.0–5.0)
Cholesterol, Total: 146 mg/dL (ref 100–199)
HDL: 66 mg/dL (ref >39)
LDL Chol Calc (NIH): 65 mg/dL (ref 0–99)
Triglycerides: 79 mg/dL (ref 0–149)
VLDL Cholesterol Cal: 15 mg/dL (ref 5–40)

## 2021-04-27 LAB — BASIC METABOLIC PANEL
BUN/Creatinine Ratio: 26 — ABNORMAL HIGH (ref 10–24)
BUN: 22 mg/dL (ref 8–27)
CO2: 24 mmol/L (ref 20–29)
Calcium: 9.4 mg/dL (ref 8.6–10.2)
Chloride: 101 mmol/L (ref 96–106)
Creatinine, Ser: 0.85 mg/dL (ref 0.76–1.27)
Glucose: 118 mg/dL — ABNORMAL HIGH (ref 65–99)
Potassium: 4.5 mmol/L (ref 3.5–5.2)
Sodium: 138 mmol/L (ref 134–144)
eGFR: 88 mL/min/{1.73_m2} (ref >59)

## 2021-05-07 ENCOUNTER — Telehealth: Payer: Self-pay | Admitting: Family Medicine

## 2021-05-07 NOTE — Telephone Encounter (Signed)
Patient came to office to clarify upcoming appointments; stated he was told to see the provider for an additional appointment before the one scheduled for 9/19. No additional appointment on the schedule. Patient doesn't remember why he needs it. Please advise at [phone].

## 2021-05-07 NOTE — Telephone Encounter (Signed)
Pt contacted. Reviewed chart; no additional appts needing unless having issues. Reminded pt of 05/20/21 appt. Pt verbalized understanding.

## 2021-05-08 NOTE — Telephone Encounter (Signed)
Encounter created in error. Please disregard.

## 2021-05-14 DIAGNOSIS — Z1283 Encounter for screening for malignant neoplasm of skin: Secondary | ICD-10-CM | POA: Diagnosis not present

## 2021-05-14 DIAGNOSIS — L570 Actinic keratosis: Secondary | ICD-10-CM | POA: Diagnosis not present

## 2021-05-14 DIAGNOSIS — Z08 Encounter for follow-up examination after completed treatment for malignant neoplasm: Secondary | ICD-10-CM | POA: Diagnosis not present

## 2021-05-14 DIAGNOSIS — X32XXXD Exposure to sunlight, subsequent encounter: Secondary | ICD-10-CM | POA: Diagnosis not present

## 2021-05-14 DIAGNOSIS — Z85820 Personal history of malignant melanoma of skin: Secondary | ICD-10-CM | POA: Diagnosis not present

## 2021-05-16 DIAGNOSIS — E782 Mixed hyperlipidemia: Secondary | ICD-10-CM | POA: Diagnosis not present

## 2021-05-17 LAB — LIPID PANEL
Chol/HDL Ratio: 3 ratio (ref 0.0–5.0)
Cholesterol, Total: 141 mg/dL (ref 100–199)
HDL: 47 mg/dL (ref >39)
LDL Chol Calc (NIH): 72 mg/dL (ref 0–99)
Triglycerides: 125 mg/dL (ref 0–149)
VLDL Cholesterol Cal: 22 mg/dL (ref 5–40)

## 2021-05-20 ENCOUNTER — Ambulatory Visit: Payer: PPO | Admitting: Family Medicine

## 2021-06-03 ENCOUNTER — Other Ambulatory Visit: Payer: Self-pay | Admitting: Physician Assistant

## 2021-06-03 NOTE — Telephone Encounter (Signed)
Next Visit: 07/17/2021  Last Visit: 02/13/2021  Last Fill: 02/13/2021  DX: Rheumatoid arthritis involving multiple sites with positive rheumatoid factor   Current Dose per office note 02/13/2021: MTX 2.5 mg 6 tablets by mouth every 7 days   Labs: 01/14/2021 Glucose is elevated, probably not a fasting sample.  Anemia is a stable.  Attempted to contact patient to advise he is due to update labs. Unable to leave message, voicemail is full.   Okay to refill MTX?

## 2021-06-06 ENCOUNTER — Other Ambulatory Visit: Payer: Self-pay

## 2021-06-06 ENCOUNTER — Ambulatory Visit (INDEPENDENT_AMBULATORY_CARE_PROVIDER_SITE_OTHER): Payer: PPO | Admitting: Family Medicine

## 2021-06-06 ENCOUNTER — Encounter: Payer: Self-pay | Admitting: Family Medicine

## 2021-06-06 VITALS — BP 122/70 | HR 61 | Temp 98.1°F | Ht 67.0 in | Wt 180.0 lb

## 2021-06-06 DIAGNOSIS — F321 Major depressive disorder, single episode, moderate: Secondary | ICD-10-CM

## 2021-06-06 DIAGNOSIS — Z23 Encounter for immunization: Secondary | ICD-10-CM | POA: Diagnosis not present

## 2021-06-06 DIAGNOSIS — N400 Enlarged prostate without lower urinary tract symptoms: Secondary | ICD-10-CM

## 2021-06-06 DIAGNOSIS — M0579 Rheumatoid arthritis with rheumatoid factor of multiple sites without organ or systems involvement: Secondary | ICD-10-CM | POA: Diagnosis not present

## 2021-06-06 MED ORDER — FLUOXETINE HCL 20 MG PO CAPS: 20.0000 mg | ORAL_CAPSULE | Freq: Every day | ORAL | 5 refills | Status: DC

## 2021-06-06 NOTE — Progress Notes (Signed)
   Subjective:    Patient ID: Bryan Homans Sr., male    DOB: 07-22-1941, 80 y.o.   MRN: [id number]  HPI Follow up chronic medical conditions Referral to [facility], missed appt  He relates that he is trying to do the best he can he does uses diuretic and potassium intermittently when he has significant swelling He does relate that at times he feels down and sad but not suicidal He does take his cholesterol medicine on a regular basis  Review of Systems     Objective:   Physical Exam  General-in no acute distress Eyes-no discharge Lungs-respiratory rate normal, CTA CV-no murmurs,RRR Extremities skin warm dry lower leg edema Neuro grossly normal Behavior normal, alert       Assessment & Plan:  Follow-up with [facility] as planned  1. Immunization due Today - Flu Vaccine QUAD High Dose(Fluad)  2. Benign prostatic hyperplasia without lower urinary tract symptoms We will try to help set up his appointment he missed his appointment he stated he needed help resetting the appointment  3. Rheumatoid arthritis involving multiple sites with positive rheumatoid factor (HCC) Continue current medication rheumatoid arthritis under good control  Moderate depression not suicidal bump up the dose of the medication - FLUoxetine (PROZAC) 20 MG capsule; Take 1 capsule (20 mg total) by mouth daily.  Dispense: 30 capsule; Refill: 5   Follow-up within 4 to 5 months follow-up sooner problems or if not improving with his moods over the next month if suicidal follow-up immediately here or ER

## 2021-06-06 NOTE — Patient Instructions (Signed)
Results for orders placed or performed in visit on 04/19/21  Lipid panel  Result Value Ref Range   Cholesterol, Total 146 100 - 199 mg/dL   Triglycerides 79 0 - 149 mg/dL   HDL 66 >39 mg/dL   VLDL Cholesterol Cal 15 5 - 40 mg/dL   LDL Chol Calc (NIH) 65 0 - 99 mg/dL   Chol/HDL Ratio 2.2 0.0 - 5.0 ratio  Basic metabolic panel  Result Value Ref Range   Glucose 118 (H) 65 - 99 mg/dL   BUN 22 8 - 27 mg/dL   Creatinine, Ser 0.85 0.76 - 1.27 mg/dL   eGFR 88 >59 mL/min/1.73   BUN/Creatinine Ratio 26 (H) 10 - 24   Sodium 138 134 - 144 mmol/L   Potassium 4.5 3.5 - 5.2 mmol/L   Chloride 101 96 - 106 mmol/L   CO2 24 20 - 29 mmol/L   Calcium 9.4 8.6 - 10.2 mg/dL   Please follow up in March  If you need anything please call us!! Thanks- Dr Nicki Reaper

## 2021-07-03 NOTE — Progress Notes (Signed)
Office Visit Note  Patient: Bryan Corkins Dantes Sr.             Date of Birth: Jan 02, 1941           MRN: [id number]             PCP: Kathyrn Drown, MD Referring: Kathyrn Drown, MD Visit Date: 07/17/2021 Occupation: @GUAROCC @  Subjective:  Joint stiffness.   History of Present Illness: Bryan GLASSCOCK Sr. is a 80 y.o. male with a history of rheumatoid arthritis.  He states he is doing well on methotrexate.  He denies any joint swelling.  He continues to have some stiffness in his joints.  He has difficulty climbing stairs and holding objects due to severe arthritis.  Although he is very active.  He states he drives a stick shift car and also does gardening.  Activities of Daily Living:  Patient reports morning stiffness for 20 minutes.   Patient Reports nocturnal pain.  Difficulty dressing/grooming: Denies Difficulty climbing stairs: Reports Difficulty getting out of chair: Reports Difficulty using hands for taps, buttons, cutlery, and/or writing: Reports  Review of Systems  Constitutional:  Positive for fatigue.  HENT:  Positive for mouth dryness.   Eyes:  Positive for dryness.  Respiratory:  Negative for shortness of breath.   Cardiovascular:  Positive for swelling in legs/feet.  Gastrointestinal:  Negative for constipation.  Endocrine: Positive for excessive thirst.  Genitourinary:  Negative for difficulty urinating.  Musculoskeletal:  Positive for joint pain, gait problem, joint pain, joint swelling, muscle weakness and morning stiffness.  Skin:  Negative for rash.  Allergic/Immunologic: Negative for susceptible to infections.  Neurological:  Positive for numbness and weakness.  Hematological:  Positive for bruising/bleeding tendency.  Psychiatric/Behavioral:  Negative for sleep disturbance.    PMFS History:  Patient Active Problem List   Diagnosis Date Noted   [name] ([place]) 01/29/2021   Anemia, chronic disease 06/25/2020   High risk medication use 09/19/2016    History of total knee replacement, bilateral 09/19/2016   Gastroesophageal reflux disease without esophagitis 09/19/2016   History of melanoma 09/19/2016   History of basal cell carcinoma 09/19/2016   History of anemia 09/19/2016   Allergic rhinitis 02/18/2016   Melanoma of skin ([place]) 06/06/2015   Prediabetes 11/20/2014   Hyperlipidemia 11/20/2014   BPH (benign prostatic hyperplasia) 05/22/2014   Rheumatoid arthritis involving multiple sites with positive rheumatoid factor ([place]) 01/05/2013   Chronic pain syndrome 01/05/2013    Past Medical History:  Diagnosis Date   Anemia    Basal cell carcinoma    BPH (benign prostatic hyperplasia)    GERD (gastroesophageal reflux disease)    Melanoma (HCC)    Osteoarthritis    Rheumatoid aortitis    Rheumatoid arthritis ([place])     Family History  Problem Relation Age of Onset   Rheum arthritis Mother    Emphysema Father    Cancer Brother    Emphysema Sister    Past Surgical History:  Procedure Laterality Date   CATARACT EXTRACTION W/PHACO Right 03/12/2020   Procedure: CATARACT EXTRACTION PHACO AND INTRAOCULAR LENS PLACEMENT RIGHT EYE;  Surgeon: Baruch Goldmann, MD;  Location: [facility];  Service: Ophthalmology;  Laterality: Right;  CDE: 9.99   CATARACT EXTRACTION W/PHACO Left 03/26/2020   Procedure: CATARACT EXTRACTION PHACO AND INTRAOCULAR LENS PLACEMENT (IOC);  Surgeon: Baruch Goldmann, MD;  Location: [facility];  Service: Ophthalmology;  Laterality: Left;  CDE: 9.16    COLONOSCOPY     HERNIA REPAIR  JOINT REPLACEMENT Bilateral    KNEE ARTHROPLASTY     MELANOMA EXCISION     MELANOMA EXCISION  09/22/2018   on head   Social History   Social History Narrative   Not on file   Immunization History  Administered Date(s) Administered   Fluad Quad(high Dose 65+) 06/25/2020, 06/06/2021   Influenza Split 05/19/2013   Influenza, High Dose Seasonal PF 05/20/2019   Influenza,inj,Quad PF,6+ Mos 05/22/2014, 05/22/2015, 06/06/2016, 06/10/2017,  05/14/2018   Influenza-Unspecified 05/20/2019   PFIZER(Purple Top)SARS-COV-2 Vaccination 11/14/2019, 12/05/2019   Pneumococcal Conjugate-13 05/22/2014   Pneumococcal Polysaccharide-23 05/19/2013   Zoster Recombinat (Shingrix) 09/06/2018, 03/11/2019     Objective: Vital Signs: BP 122/69 (BP Location: Left Arm, Patient Position: Sitting, Cuff Size: Small)   Pulse 71   Resp 12   Ht 5\' 7"  (1.702 m)   Wt 182 lb 6.4 oz (82.7 kg)   BMI 28.57 kg/m    Physical Exam Vitals and nursing note reviewed.  Constitutional:      Appearance: He is well-developed.  HENT:     Head: Normocephalic and atraumatic.  Eyes:     Conjunctiva/sclera: Conjunctivae normal.     Pupils: Pupils are equal, round, and reactive to light.  Cardiovascular:     Rate and Rhythm: Normal rate and regular rhythm.     Heart sounds: Normal heart sounds.  Pulmonary:     Effort: Pulmonary effort is normal.     Breath sounds: Normal breath sounds.  Abdominal:     General: Bowel sounds are normal.     Palpations: Abdomen is soft.  Musculoskeletal:     Cervical back: Normal range of motion and neck supple.  Skin:    General: Skin is warm and dry.     Capillary Refill: Capillary refill takes less than 2 seconds.  Neurological:     Mental Status: He is alert and oriented to person, place, and time.  Psychiatric:        Behavior: Behavior normal.     Musculoskeletal Exam: He had limited range of motion of his cervical spine.  Right shoulder abduction was limited to 90 degrees.  Left shoulder abduction limited 210 degrees.  He had limited internal rotation of his shoulders.  Right elbow joint contracture was noted.  He had no range of motion in his wrist joints.  Synovial thickening was noted over wrist joints and MCPs.  He has swan-neck deformities in his bilateral PIPs.  Hip joints were in good range of motion.  Both knee joints are replaced and were in good range of motion.  He had no tenderness over ankles or  MTPs.  CDAI Exam: CDAI Score: 0.8  Patient Global: 4 mm; Provider Global: 4 mm Swollen: 0 ; Tender: 0  Joint Exam 07/17/2021   No joint exam has been documented for this visit   There is currently no information documented on the homunculus. Go to the Rheumatology activity and complete the homunculus joint exam.  Investigation: No additional findings.  Imaging: No results found.  Recent Labs: Lab Results  Component Value Date   WBC 5.5 07/11/2021   HGB 10.9 (L) 07/11/2021   PLT 165 07/11/2021   NA 140 07/11/2021   K 4.3 07/11/2021   CL 105 07/11/2021   CO2 26 07/11/2021   GLUCOSE 98 07/11/2021   BUN 18 07/11/2021   CREATININE 0.85 07/11/2021   BILITOT 0.7 07/11/2021   ALKPHOS 76 10/10/2019   AST 22 07/11/2021   ALT 17 07/11/2021   PROT  6.6 07/11/2021   ALBUMIN 4.5 10/10/2019   CALCIUM 8.6 07/11/2021   GFRAA 96 01/14/2021    Speciality Comments: No specialty comments available.  Procedures:  No procedures performed Allergies: Morphine and related and Acyclovir and related   Assessment / Plan:     Visit Diagnoses: Rheumatoid arthritis involving multiple sites with positive rheumatoid factor (HCC) - Positive rheumatoid factor, positive anti-CCP, severe erosive disease with nodulosis and contractures: He has severe rheumatoid arthritis with multiple contractures.  He had no active synovitis on examination.  He has synovial thickening.  He continues to have some stiffness.  He states that he does not have much discomfort and stays very active.  He has been tolerating methotrexate well.  High risk medication use - MTX 2.5 mg 6 tablets by mouth every 7 days and folic acid 1 mg 2 tablets daily.  Labs from 6 July 11, 2021 were reviewed hemoglobin was 10.9 with elevated MCV.  He has been taking folic acid on a regular basis.  CMP with GFR was normal.  He has been advised to hold methotrexate in case he develops an infection and restart after the infection resolves..   Information regarding immunization was also placed in the AVS.  History of total knee replacement, bilateral-he had good range of motion bilateral knee joints without discomfort.  Chronic pain syndrome - He takes tylenol #3 or tylenol arthritis for pain relief.  Prediabetes  History of hyperlipidemia  History of gastroesophageal reflux (GERD)  History of basal cell carcinoma  History of melanoma-he is followed by dermatologist every 6 months per patient.  History of anemia  Orders: No orders of the defined types were placed in this encounter.  No orders of the defined types were placed in this encounter.    Follow-Up Instructions: Return in about 5 months (around 12/15/2021) for Rheumatoid arthritis.   Bo Merino, MD  Note - This record has been created using Editor, commissioning.  Chart creation errors have been sought, but may not always  have been located. Such creation errors do not reflect on  the standard of medical care.

## 2021-07-09 ENCOUNTER — Other Ambulatory Visit: Payer: Self-pay | Admitting: Physician Assistant

## 2021-07-11 ENCOUNTER — Other Ambulatory Visit: Payer: Self-pay

## 2021-07-11 DIAGNOSIS — Z79899 Other long term (current) drug therapy: Secondary | ICD-10-CM | POA: Diagnosis not present

## 2021-07-12 ENCOUNTER — Other Ambulatory Visit: Payer: Self-pay | Admitting: *Deleted

## 2021-07-12 LAB — CBC WITH DIFFERENTIAL/PLATELET
Absolute Monocytes: 391 cells/uL (ref 200–950)
Basophils Absolute: 28 cells/uL (ref 0–200)
Basophils Relative: 0.5 %
Eosinophils Absolute: 385 cells/uL (ref 15–500)
Eosinophils Relative: 7 %
HCT: 32.6 % — ABNORMAL LOW (ref 38.5–50.0)
Hemoglobin: 10.9 g/dL — ABNORMAL LOW (ref 13.2–17.1)
Lymphs Abs: 2580 cells/uL (ref 850–3900)
MCH: 34.2 pg — ABNORMAL HIGH (ref 27.0–33.0)
MCHC: 33.4 g/dL (ref 32.0–36.0)
MCV: 102.2 fL — ABNORMAL HIGH (ref 80.0–100.0)
MPV: 10.2 fL (ref 7.5–12.5)
Monocytes Relative: 7.1 %
Neutro Abs: 2118 cells/uL (ref 1500–7800)
Neutrophils Relative %: 38.5 %
Platelets: 165 10*3/uL (ref 140–400)
RBC: 3.19 10*6/uL — ABNORMAL LOW (ref 4.20–5.80)
RDW: 13.2 % (ref 11.0–15.0)
Total Lymphocyte: 46.9 %
WBC: 5.5 10*3/uL (ref 3.8–10.8)

## 2021-07-12 LAB — COMPLETE METABOLIC PANEL WITH GFR
AG Ratio: 1.8 (calc) (ref 1.0–2.5)
ALT: 17 U/L (ref 9–46)
AST: 22 U/L (ref 10–35)
Albumin: 4.2 g/dL (ref 3.6–5.1)
Alkaline phosphatase (APISO): 68 U/L (ref 35–144)
BUN: 18 mg/dL (ref 7–25)
CO2: 26 mmol/L (ref 20–32)
Calcium: 8.6 mg/dL (ref 8.6–10.3)
Chloride: 105 mmol/L (ref 98–110)
Creat: 0.85 mg/dL (ref 0.70–1.22)
Globulin: 2.4 g/dL (calc) (ref 1.9–3.7)
Glucose, Bld: 98 mg/dL (ref 65–99)
Potassium: 4.3 mmol/L (ref 3.5–5.3)
Sodium: 140 mmol/L (ref 135–146)
Total Bilirubin: 0.7 mg/dL (ref 0.2–1.2)
Total Protein: 6.6 g/dL (ref 6.1–8.1)
eGFR: 88 mL/min/{1.73_m2} (ref >=60)

## 2021-07-12 MED ORDER — METHOTREXATE SODIUM 2.5 MG PO TABS: 15.0000 mg | ORAL_TABLET | ORAL | 0 refills | Status: DC

## 2021-07-12 NOTE — Telephone Encounter (Signed)
-----   Message from Bo Merino, MD sent at 07/12/2021  8:25 AM EST ----- CMP is normal.  Anemia noted.  Please advise patient to take folic acid 2 tablets daily on a regular basis.

## 2021-07-12 NOTE — Progress Notes (Signed)
CMP is normal.  Anemia noted.  Please advise patient to take folic acid 2 tablets daily on a regular basis.

## 2021-07-12 NOTE — Telephone Encounter (Signed)
Patient requesting a refill on MTX  Next Visit: 07/17/2021  Last Visit: 02/13/2021  Last Fill: 06/03/2021 (30 day supply)  DX: Rheumatoid arthritis involving multiple sites with positive rheumatoid factor   Current Dose per office note 02/13/2021: MTX 2.5 mg 6 tablets by mouth every 7 days   Labs: 07/11/2021 CMP is normal.  Anemia noted.  Okay to refill MTX?

## 2021-07-17 ENCOUNTER — Encounter: Payer: Self-pay | Admitting: Rheumatology

## 2021-07-17 ENCOUNTER — Other Ambulatory Visit: Payer: Self-pay

## 2021-07-17 ENCOUNTER — Ambulatory Visit: Payer: PPO | Admitting: Rheumatology

## 2021-07-17 VITALS — BP 122/69 | HR 71 | Resp 12 | Ht 67.0 in | Wt 182.4 lb

## 2021-07-17 DIAGNOSIS — Z85820 Personal history of malignant melanoma of skin: Secondary | ICD-10-CM | POA: Diagnosis not present

## 2021-07-17 DIAGNOSIS — Z8719 Personal history of other diseases of the digestive system: Secondary | ICD-10-CM | POA: Diagnosis not present

## 2021-07-17 DIAGNOSIS — M0579 Rheumatoid arthritis with rheumatoid factor of multiple sites without organ or systems involvement: Secondary | ICD-10-CM

## 2021-07-17 DIAGNOSIS — Z8639 Personal history of other endocrine, nutritional and metabolic disease: Secondary | ICD-10-CM | POA: Diagnosis not present

## 2021-07-17 DIAGNOSIS — Z862 Personal history of diseases of the blood and blood-forming organs and certain disorders involving the immune mechanism: Secondary | ICD-10-CM | POA: Diagnosis not present

## 2021-07-17 DIAGNOSIS — G894 Chronic pain syndrome: Secondary | ICD-10-CM | POA: Diagnosis not present

## 2021-07-17 DIAGNOSIS — Z79899 Other long term (current) drug therapy: Secondary | ICD-10-CM

## 2021-07-17 DIAGNOSIS — R7303 Prediabetes: Secondary | ICD-10-CM | POA: Diagnosis not present

## 2021-07-17 DIAGNOSIS — Z96653 Presence of artificial knee joint, bilateral: Secondary | ICD-10-CM | POA: Diagnosis not present

## 2021-07-17 DIAGNOSIS — Z85828 Personal history of other malignant neoplasm of skin: Secondary | ICD-10-CM

## 2021-07-17 NOTE — Patient Instructions (Signed)
Standing Labs °We placed an order today for your standing lab work.  ° °Please have your standing labs drawn in February and every 3 months ° °If possible, please have your labs drawn 2 weeks prior to your appointment so that the provider can discuss your results at your appointment. ° °Please note that you may see your imaging and lab results in MyChart before we have reviewed them. °We may be awaiting multiple results to interpret others before contacting you. °Please allow our office up to 72 hours to thoroughly review all of the results before contacting the office for clarification of your results. ° °We have open lab daily: °Monday through Thursday from 1:30-4:30 PM and Friday from 1:30-4:00 PM °at the office of Dr. Kadelyn Dimascio, [facility].   °Please be advised, all patients with office appointments requiring lab work will take precedent over walk-in lab work.  °If possible, please come for your lab work on Monday and Friday afternoons, as you may experience shorter wait times. °The office is located at [address], [address], [place], [place] [address] °No appointment is necessary.   °Labs are drawn by [facility]. Please bring your co-pay at the time of your lab draw.  You may receive a bill from [facility] for your lab work. ° °If you wish to have your labs drawn at another location, please call the office 24 hours in advance to send orders. ° °If you have any questions regarding directions or hours of operation,  °please call [phone].   °As a reminder, please drink plenty of water prior to coming for your lab work. Thanks!  ° °Vaccines °You are taking a medication(s) that can suppress your immune system.  The following immunizations are recommended: °Flu annually °Covid-19  °Td/Tdap (tetanus, diphtheria, pertussis) every 10 years °Pneumonia (Prevnar 15 then Pneumovax 23 at least 1 year apart.  Alternatively, can take Prevnar 20 without needing additional dose) °Shingrix: 2 doses from 4  weeks to 6 months apart ° °Please check with your PCP to make sure you are up to date.  ° °If you have signs or symptoms of an infection or start antibiotics: °First, call your PCP for workup of your infection. °Hold your medication through the infection, until you complete your antibiotics, and until symptoms resolve if you take the following: °Injectable medication (Actemra, Benlysta, Cimzia, Cosentyx, Enbrel, Humira, Kevzara, Orencia, Remicade, Simponi, Stelara, Taltz, Tremfya) °Methotrexate °Leflunomide (Arava) °Mycophenolate (Cellcept) °Xeljanz, Olumiant, or Rinvoq  ° °

## 2021-08-07 ENCOUNTER — Other Ambulatory Visit: Payer: Self-pay | Admitting: Family Medicine

## 2021-08-13 DIAGNOSIS — Z85820 Personal history of malignant melanoma of skin: Secondary | ICD-10-CM | POA: Diagnosis not present

## 2021-08-13 DIAGNOSIS — C4441 Basal cell carcinoma of skin of scalp and neck: Secondary | ICD-10-CM | POA: Diagnosis not present

## 2021-08-13 DIAGNOSIS — Z1283 Encounter for screening for malignant neoplasm of skin: Secondary | ICD-10-CM | POA: Diagnosis not present

## 2021-08-13 DIAGNOSIS — C44612 Basal cell carcinoma of skin of right upper limb, including shoulder: Secondary | ICD-10-CM | POA: Diagnosis not present

## 2021-08-13 DIAGNOSIS — D225 Melanocytic nevi of trunk: Secondary | ICD-10-CM | POA: Diagnosis not present

## 2021-08-13 DIAGNOSIS — Z08 Encounter for follow-up examination after completed treatment for malignant neoplasm: Secondary | ICD-10-CM | POA: Diagnosis not present

## 2021-09-06 DIAGNOSIS — R350 Frequency of micturition: Secondary | ICD-10-CM | POA: Diagnosis not present

## 2021-09-06 DIAGNOSIS — R972 Elevated prostate specific antigen [PSA]: Secondary | ICD-10-CM | POA: Diagnosis not present

## 2021-09-06 DIAGNOSIS — N401 Enlarged prostate with lower urinary tract symptoms: Secondary | ICD-10-CM | POA: Diagnosis not present

## 2021-09-17 DIAGNOSIS — L928 Other granulomatous disorders of the skin and subcutaneous tissue: Secondary | ICD-10-CM | POA: Diagnosis not present

## 2021-09-17 DIAGNOSIS — L570 Actinic keratosis: Secondary | ICD-10-CM | POA: Diagnosis not present

## 2021-09-17 DIAGNOSIS — Z08 Encounter for follow-up examination after completed treatment for malignant neoplasm: Secondary | ICD-10-CM | POA: Diagnosis not present

## 2021-09-17 DIAGNOSIS — Z85820 Personal history of malignant melanoma of skin: Secondary | ICD-10-CM | POA: Diagnosis not present

## 2021-09-17 DIAGNOSIS — Z85828 Personal history of other malignant neoplasm of skin: Secondary | ICD-10-CM | POA: Diagnosis not present

## 2021-09-17 DIAGNOSIS — X32XXXD Exposure to sunlight, subsequent encounter: Secondary | ICD-10-CM | POA: Diagnosis not present

## 2021-09-23 ENCOUNTER — Telehealth: Payer: Self-pay | Admitting: Rheumatology

## 2021-09-23 DIAGNOSIS — Z79899 Other long term (current) drug therapy: Secondary | ICD-10-CM

## 2021-09-23 NOTE — Telephone Encounter (Signed)
[facility] in [place] on [address] called the office stating the patient was at their office now. Patient is aware he did not request lab orders from [place] prior to his visit with them and hoped they could do it anyway. They requested lab orders to be sent in as soon as possible because the patient didn't want to waste gas having to drive back at a later date. Informed patient that we are short staffed so it could potentially take a while. Fax #[phone]

## 2021-09-23 NOTE — Telephone Encounter (Signed)
Lab orders have been released and faxed to number provided.

## 2021-09-24 ENCOUNTER — Other Ambulatory Visit: Payer: Self-pay | Admitting: Physician Assistant

## 2021-09-24 LAB — CBC WITH DIFFERENTIAL/PLATELET
Basophils Absolute: 0 10*3/uL (ref 0.0–0.2)
Basos: 1 %
EOS (ABSOLUTE): 0.4 10*3/uL (ref 0.0–0.4)
Eos: 6 %
Hematocrit: 32.8 % — ABNORMAL LOW (ref 37.5–51.0)
Hemoglobin: 10.8 g/dL — ABNORMAL LOW (ref 13.0–17.7)
Immature Grans (Abs): 0 10*3/uL (ref 0.0–0.1)
Immature Granulocytes: 0 %
Lymphocytes Absolute: 3 10*3/uL (ref 0.7–3.1)
Lymphs: 46 %
MCH: 32.6 pg (ref 26.6–33.0)
MCHC: 32.9 g/dL (ref 31.5–35.7)
MCV: 99 fL — ABNORMAL HIGH (ref 79–97)
Monocytes Absolute: 0.5 10*3/uL (ref 0.1–0.9)
Monocytes: 8 %
Neutrophils Absolute: 2.5 10*3/uL (ref 1.4–7.0)
Neutrophils: 39 %
Platelets: 183 10*3/uL (ref 150–450)
RBC: 3.31 x10E6/uL — ABNORMAL LOW (ref 4.14–5.80)
RDW: 13.8 % (ref 11.6–15.4)
WBC: 6.4 10*3/uL (ref 3.4–10.8)

## 2021-09-24 LAB — CMP14+EGFR
ALT: 13 IU/L (ref 0–44)
AST: 19 IU/L (ref 0–40)
Albumin/Globulin Ratio: 1.8 (ref 1.2–2.2)
Albumin: 4.3 g/dL (ref 3.6–4.6)
Alkaline Phosphatase: 79 IU/L (ref 44–121)
BUN/Creatinine Ratio: 21 (ref 10–24)
BUN: 20 mg/dL (ref 8–27)
Bilirubin Total: 0.7 mg/dL (ref 0.0–1.2)
CO2: 24 mmol/L (ref 20–29)
Calcium: 8.8 mg/dL (ref 8.6–10.2)
Chloride: 102 mmol/L (ref 96–106)
Creatinine, Ser: 0.94 mg/dL (ref 0.76–1.27)
Globulin, Total: 2.4 g/dL (ref 1.5–4.5)
Glucose: 110 mg/dL — ABNORMAL HIGH (ref 70–99)
Potassium: 4.3 mmol/L (ref 3.5–5.2)
Sodium: 139 mmol/L (ref 134–144)
Total Protein: 6.7 g/dL (ref 6.0–8.5)
eGFR: 81 mL/min/{1.73_m2} (ref >59)

## 2021-09-24 NOTE — Progress Notes (Signed)
Anemia is stable.  Glucose is mildly elevated.

## 2021-09-25 NOTE — Telephone Encounter (Signed)
Next Visit: 12/18/2021  Last Visit: 07/17/2021  Last Fill: 07/12/2021  DX: Rheumatoid arthritis involving multiple sites with positive rheumatoid factor   Current Dose per office note 07/17/2021: MTX 2.5 mg 6 tablets by mouth every 7 days   Labs: 09/23/2021 Anemia is stable.  Glucose is mildly elevated.    Okay to refill MTX?

## 2021-10-01 ENCOUNTER — Ambulatory Visit (INDEPENDENT_AMBULATORY_CARE_PROVIDER_SITE_OTHER): Payer: PPO | Admitting: Family Medicine

## 2021-10-01 ENCOUNTER — Other Ambulatory Visit: Payer: Self-pay

## 2021-10-01 VITALS — BP 124/76

## 2021-10-01 DIAGNOSIS — N410 Acute prostatitis: Secondary | ICD-10-CM | POA: Diagnosis not present

## 2021-10-01 DIAGNOSIS — R3916 Straining to void: Secondary | ICD-10-CM

## 2021-10-01 DIAGNOSIS — R3589 Other polyuria: Secondary | ICD-10-CM

## 2021-10-01 DIAGNOSIS — N401 Enlarged prostate with lower urinary tract symptoms: Secondary | ICD-10-CM

## 2021-10-01 LAB — POCT URINALYSIS DIPSTICK
Spec Grav, UA: >=1.03 — AB (ref 1.010–1.025)
pH, UA: 6 (ref 5.0–8.0)

## 2021-10-01 MED ORDER — TAMSULOSIN HCL 0.4 MG PO CAPS: ORAL_CAPSULE | ORAL | 5 refills | Status: DC

## 2021-10-01 MED ORDER — DOXYCYCLINE HYCLATE 100 MG PO TABS: ORAL_TABLET | ORAL | 0 refills | Status: DC

## 2021-10-01 NOTE — Progress Notes (Signed)
° °  Subjective:    Patient ID: Bryan Homans Sr., male    DOB: 1940-09-07, 81 y.o.   MRN: [id number]  HPI Pt having frequent urination at night.  Frequency decreased urine flow intermittent burning denies abdominal pain fever chills flank pain  Review of Systems     Objective:   Physical Exam  Lungs clear heart regular abdomen is soft      Assessment & Plan:  1. Polyuria Will go ahead and do a urine culture and urinalysis I believe it is more related to his BPH with restriction on urination  - Urine Culture - POCT Urinalysis Dipstick  2. Benign prostatic hyperplasia (BPH) with straining on urination Go ahead with bumping up dose of Flomax 2 tablets each evening if it causes side effects or problems notify us follow-up if progressive troubles or problems If not improving over the next 4 weeks consideration for urology consultation Antibiotics prescribed for probable prostatitis proper way to take them discussed

## 2021-10-02 ENCOUNTER — Telehealth: Payer: Self-pay | Admitting: Rheumatology

## 2021-10-02 MED ORDER — METHOTREXATE SODIUM 2.5 MG PO TABS: 15.0000 mg | ORAL_TABLET | ORAL | 0 refills | Status: DC

## 2021-10-02 NOTE — Telephone Encounter (Signed)
Patient called the office stating he has been trying to fill his MTX with his pharmacy for a week and now they told him they no longer have the prescription and cannot fill his medication. Patient states he is unsure of what happened but needs this medication asap. [facility] in [place]

## 2021-10-02 NOTE — Telephone Encounter (Signed)
Spoke with pharmacy and they have the prescription on file but do not have any in stock. The pharmacist states their wholesaler does not have any in stock. Spoke with patient and advised. Patient requests his prescription to be sent to the [facility] in [place]. Prescription resent to the [facility] as requested.

## 2021-10-04 LAB — URINE CULTURE

## 2021-10-14 ENCOUNTER — Telehealth: Payer: Self-pay | Admitting: Family Medicine
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
## 2021-10-14 NOTE — Telephone Encounter (Signed)
Prescription called into [facility] on [address] as requested by patient.  Patient was notified and advised that insurance may not cover the script

## 2021-10-28 ENCOUNTER — Telehealth: Payer: Self-pay | Admitting: Family Medicine

## 2021-10-28 MED ORDER — ATORVASTATIN CALCIUM 10 MG PO TABS: 10.0000 mg | ORAL_TABLET | Freq: Every day | ORAL | 0 refills | Status: DC

## 2021-10-28 NOTE — Telephone Encounter (Signed)
Prescription sent electronically to pharmacy. Patient notified. 

## 2021-10-28 NOTE — Telephone Encounter (Signed)
Patient came to office to request refill of  atorvastatin (LIPITOR) 10 MG tablet [[id number]]   Patient requesting for preferred pharmacy to be changed to [place] near [place]  [phone]  Patient completely out of medication; took last dose this morning.    Please advise at [phone]

## 2021-11-04 ENCOUNTER — Ambulatory Visit: Payer: PPO | Admitting: Family Medicine

## 2021-11-10 ENCOUNTER — Other Ambulatory Visit: Payer: Self-pay | Admitting: Family Medicine

## 2021-11-10 DIAGNOSIS — M0579 Rheumatoid arthritis with rheumatoid factor of multiple sites without organ or systems involvement: Secondary | ICD-10-CM

## 2021-11-12 DIAGNOSIS — Z85820 Personal history of malignant melanoma of skin: Secondary | ICD-10-CM | POA: Diagnosis not present

## 2021-11-12 DIAGNOSIS — D485 Neoplasm of uncertain behavior of skin: Secondary | ICD-10-CM | POA: Diagnosis not present

## 2021-11-12 DIAGNOSIS — L821 Other seborrheic keratosis: Secondary | ICD-10-CM | POA: Diagnosis not present

## 2021-11-12 DIAGNOSIS — D225 Melanocytic nevi of trunk: Secondary | ICD-10-CM | POA: Diagnosis not present

## 2021-11-12 DIAGNOSIS — Z1283 Encounter for screening for malignant neoplasm of skin: Secondary | ICD-10-CM | POA: Diagnosis not present

## 2021-11-12 DIAGNOSIS — Z08 Encounter for follow-up examination after completed treatment for malignant neoplasm: Secondary | ICD-10-CM | POA: Diagnosis not present

## 2021-11-26 ENCOUNTER — Ambulatory Visit: Payer: PPO | Admitting: Family Medicine

## 2021-12-04 NOTE — Progress Notes (Signed)
? ?Office Visit Note ? ?Patient: Bryan Calvillo Bushart Sr.             ?Date of Birth: 1941-04-30           ?MRN: [id number]             ?PCP: Kathyrn Drown, MD ?Referring: Kathyrn Drown, MD ?Visit Date: 12/18/2021 ?Occupation: '@GUAROCC'$ @ ? ?Subjective:  ?Chronic pain in both feet  ? ?History of Present Illness: Bryan BOLLIER Sr. is a 81 y.o. male with history of seropositive rheumatoid arthritis.  He is taking methotrexate 6 tablets by mouth once weekly and folic acid 2 mg daily.  He continues to tolerate methotrexate without any side effects and has not missed any doses recently.  He states that he has had some increased joint pain and stiffness as he has increased his activity level with warmer weather temperatures.  He has chronic pain and overcrowding in both feet.  He has not seen a podiatrist  yet. He states he continues to take Tylenol 3 or Tylenol arthritis for pain relief.  He also uses topical agents as needed for pain relief.  He states overall his knee replacements are doing well.  He denies any recent infections. ? ?Activities of Daily Living:  ?Patient reports morning stiffness for 20 minutes.   ?Patient Reports nocturnal pain.  ?Difficulty dressing/grooming: Reports ?Difficulty climbing stairs: Denies ?Difficulty getting out of chair: Denies ?Difficulty using hands for taps, buttons, cutlery, and/or writing: Reports ? ?Review of Systems  ?Constitutional:  Positive for fatigue. Negative for night sweats.  ?HENT:  Positive for mouth dryness. Negative for mouth sores and nose dryness.   ?Eyes:  Positive for dryness. Negative for redness.  ?Respiratory:  Negative for difficulty breathing.   ?Cardiovascular:  Positive for swelling in legs/feet. Negative for chest pain, palpitations, hypertension and irregular heartbeat.  ?Gastrointestinal:  Negative for constipation and diarrhea.  ?Genitourinary:  Negative for difficulty urinating and painful urination.  ?Musculoskeletal:  Positive for joint pain, gait  problem, joint pain, joint swelling, muscle weakness, morning stiffness and muscle tenderness. Negative for myalgias and myalgias.  ?Skin:  Positive for rash and redness. Negative for color change, hair loss, nodules/bumps, skin tightness, ulcers and sensitivity to sunlight.  ?Allergic/Immunologic: Negative for susceptible to infections.  ?Neurological:  Positive for numbness and weakness. Negative for dizziness, fainting, memory loss and night sweats.  ?Hematological:  Positive for bruising/bleeding tendency. Negative for swollen glands.  ?Psychiatric/Behavioral:  Positive for sleep disturbance. Negative for depressed mood. The patient is not nervous/anxious.   ? ?PMFS History:  ?Patient Active Problem List  ? Diagnosis Date Noted  ? [name] ([place]) 01/29/2021  ? Anemia, chronic disease 06/25/2020  ? High risk medication use 09/19/2016  ? History of total knee replacement, bilateral 09/19/2016  ? Gastroesophageal reflux disease without esophagitis 09/19/2016  ? History of melanoma 09/19/2016  ? History of basal cell carcinoma 09/19/2016  ? History of anemia 09/19/2016  ? Allergic rhinitis 02/18/2016  ? Melanoma of skin ([place]) 06/06/2015  ? Prediabetes 11/20/2014  ? Hyperlipidemia 11/20/2014  ? BPH (benign prostatic hyperplasia) 05/22/2014  ? Rheumatoid arthritis involving multiple sites with positive rheumatoid factor ([place]) 01/05/2013  ? Chronic pain syndrome 01/05/2013  ?  ?Past Medical History:  ?Diagnosis Date  ? Anemia   ? Basal cell carcinoma   ? BPH (benign prostatic hyperplasia)   ? GERD (gastroesophageal reflux disease)   ? Melanoma ([place])   ? Osteoarthritis   ? Rheumatoid aortitis   ?  Rheumatoid arthritis ([place])   ?  ?Family History  ?Problem Relation Age of Onset  ? Rheum arthritis Mother   ? Emphysema Father   ? Cancer Brother   ? Emphysema Sister   ? ?Past Surgical History:  ?Procedure Laterality Date  ? CATARACT EXTRACTION W/PHACO Right 03/12/2020  ? Procedure: CATARACT EXTRACTION PHACO AND  INTRAOCULAR LENS PLACEMENT RIGHT EYE;  Surgeon: Baruch Goldmann, MD;  Location: [facility];  Service: Ophthalmology;  Laterality: Right;  CDE: 9.99  ? CATARACT EXTRACTION W/PHACO Left 03/26/2020  ? Procedure: CATARACT EXTRACTION PHACO AND INTRAOCULAR LENS PLACEMENT (IOC);  Surgeon: Baruch Goldmann, MD;  Location: [facility];  Service: Ophthalmology;  Laterality: Left;  CDE: 9.16 ?  ? COLONOSCOPY    ? HERNIA REPAIR    ? JOINT REPLACEMENT Bilateral   ? KNEE ARTHROPLASTY    ? MELANOMA EXCISION    ? MELANOMA EXCISION  09/22/2018  ? on head  ? ?Social History  ? ?Social History Narrative  ? Not on file  ? ?Immunization History  ?Administered Date(s) Administered  ? Fluad Quad(high Dose 65+) 06/25/2020, 06/06/2021  ? Influenza Split 05/19/2013  ? Influenza, High Dose Seasonal PF 05/20/2019  ? Influenza,inj,Quad PF,6+ Mos 05/22/2014, 05/22/2015, 06/06/2016, 06/10/2017, 05/14/2018  ? Influenza-Unspecified 05/20/2019  ? PFIZER(Purple Top)SARS-COV-2 Vaccination 11/14/2019, 12/05/2019  ? Pneumococcal Conjugate-13 05/22/2014  ? Pneumococcal Polysaccharide-23 05/19/2013  ? Zoster Recombinat (Shingrix) 09/06/2018, 03/11/2019  ?  ? ?Objective: ?Vital Signs: BP 94/60 (BP Location: Left Arm, Patient Position: Sitting, Cuff Size: Normal)   Pulse 77   Resp 16   Ht 5' 7.5" (1.715 m)   Wt 183 lb (83 kg)   BMI 28.24 kg/m?   ? ?Physical Exam ?Vitals and nursing note reviewed.  ?Constitutional:   ?   Appearance: He is well-developed.  ?HENT:  ?   Head: Normocephalic and atraumatic.  ?Eyes:  ?   Conjunctiva/sclera: Conjunctivae normal.  ?   Pupils: Pupils are equal, round, and reactive to light.  ?Cardiovascular:  ?   Rate and Rhythm: Normal rate and regular rhythm.  ?   Heart sounds: Normal heart sounds.  ?Pulmonary:  ?   Effort: Pulmonary effort is normal.  ?   Breath sounds: Normal breath sounds.  ?Abdominal:  ?   General: Bowel sounds are normal.  ?   Palpations: Abdomen is soft.  ?Musculoskeletal:  ?   Cervical back: Normal range of motion  and neck supple.  ?Skin: ?   General: Skin is warm and dry.  ?   Capillary Refill: Capillary refill takes less than 2 seconds.  ?Neurological:  ?   Mental Status: He is alert and oriented to person, place, and time.  ?Psychiatric:     ?   Behavior: Behavior normal.  ?  ? ?Musculoskeletal Exam: C-spine has limited range of motion.  Thoracic kyphosis noted.  Right shoulder limited abduction to about 90 degrees.  Left shoulder has limited abduction to about 120 degrees.  Right elbow joint contracture noted.  Both wrist joints are fused.  Synovial thickening of both wrist and MCPs.  Swan neck deformities of all PIP joints.  Incomplete fist formation. Limited extension of the left knee replacement.  Synovial thickening of both ankles.  Overcrowding of toes.  Bunions bilaterally.  ? ?CDAI Exam: ?CDAI Score: 0.4  ?Patient Global: 2 mm; Provider Global: 2 mm ?Swollen: 0 ; Tender: 0  ?Joint Exam 12/18/2021  ? ?No joint exam has been documented for this visit  ? ?There is currently no  information documented on the homunculus. Go to the Rheumatology activity and complete the homunculus joint exam. ? ?Investigation: ?No additional findings. ? ?Imaging: ?No results found. ? ?Recent Labs: ?Lab Results  ?Component Value Date  ? WBC 6.4 09/23/2021  ? HGB 10.8 (L) 09/23/2021  ? PLT 183 09/23/2021  ? NA 139 09/23/2021  ? K 4.3 09/23/2021  ? CL 102 09/23/2021  ? CO2 24 09/23/2021  ? GLUCOSE 110 (H) 09/23/2021  ? BUN 20 09/23/2021  ? CREATININE 0.94 09/23/2021  ? BILITOT 0.7 09/23/2021  ? ALKPHOS 79 09/23/2021  ? AST 19 09/23/2021  ? ALT 13 09/23/2021  ? PROT 6.7 09/23/2021  ? ALBUMIN 4.3 09/23/2021  ? CALCIUM 8.8 09/23/2021  ? GFRAA 96 01/14/2021  ? ? ?Speciality Comments: No specialty comments available. ? ?Procedures:  ?No procedures performed ?Allergies: Morphine and related and Acyclovir and related  ? ?Assessment / Plan:     ?Visit Diagnoses: Rheumatoid arthritis involving multiple sites with positive rheumatoid factor (HCC) -  Positive rheumatoid factor, positive anti-CCP, severe erosive disease with nodulosis and contractures: He has no synovitis on examination today.  He continues to experience intermittent arthralgias and joint stiffness.  He ex

## 2021-12-11 ENCOUNTER — Ambulatory Visit (INDEPENDENT_AMBULATORY_CARE_PROVIDER_SITE_OTHER): Payer: PPO | Admitting: Family Medicine

## 2021-12-11 VITALS — BP 108/68 | HR 65 | Temp 97.4°F | Ht 67.0 in | Wt 189.0 lb

## 2021-12-11 DIAGNOSIS — N401 Enlarged prostate with lower urinary tract symptoms: Secondary | ICD-10-CM | POA: Diagnosis not present

## 2021-12-11 DIAGNOSIS — R7303 Prediabetes: Secondary | ICD-10-CM

## 2021-12-11 DIAGNOSIS — R3916 Straining to void: Secondary | ICD-10-CM | POA: Diagnosis not present

## 2021-12-11 DIAGNOSIS — E782 Mixed hyperlipidemia: Secondary | ICD-10-CM

## 2021-12-11 MED ORDER — OMEPRAZOLE 20 MG PO CPDR: 20.0000 mg | DELAYED_RELEASE_CAPSULE | Freq: Every day | ORAL | 1 refills | Status: DC

## 2021-12-11 MED ORDER — FUROSEMIDE 20 MG PO TABS
20.0000 mg | ORAL_TABLET | ORAL | 1 refills | Status: DC | PRN
Start: 2021-12-11 — End: 2022-03-23

## 2021-12-11 MED ORDER — POTASSIUM CHLORIDE CRYS ER 20 MEQ PO TBCR
EXTENDED_RELEASE_TABLET | ORAL | 5 refills | Status: DC
Start: 2021-12-11 — End: 2022-06-23

## 2021-12-11 NOTE — Progress Notes (Signed)
? ?  Subjective:  ? ? Patient ID: Bryan Homans Sr., male    DOB: 08/16/1941, 81 y.o.   MRN: [id number] ? ?HPI ?3 month follow up, voices no concerns  ?Has upcoming appt with derm  ?Benign prostatic hyperplasia (BPH) with straining on urination - Plan: Lipid panel, Hepatic function panel, Basic metabolic panel ? ?Mixed hyperlipidemia - Plan: Lipid panel, Hepatic function panel, Basic metabolic panel ? ?Prediabetes - Plan: Lipid panel, Hepatic function panel, Basic metabolic panel ?Patient does relate he tries to eat healthy.  States urinating okay.  Followed by rheumatologist on a regular basis. ? ?Review of Systems ? ?   ?Objective:  ? Physical Exam ?General-in no acute distress ?Eyes-no discharge ?Lungs-respiratory rate normal, CTA ?CV-no murmurs,RRR ?Extremities skin warm dry no edema ?Neuro grossly normal ?Behavior normal, alert ? ? ? ? ?   ?Assessment & Plan:  ?Severe arthritis ?1. Benign prostatic hyperplasia (BPH) with straining on urination ?Continue medication.  Await results. ?- Lipid panel ?- Hepatic function panel ?- Basic metabolic panel ? ?2. Mixed hyperlipidemia ?Continue medication await results ?- Lipid panel ?- Hepatic function panel ?- Basic metabolic panel ? ?3. Prediabetes ?Minimize starches in diet stay active ?- Lipid panel ?- Hepatic function panel ?- Basic metabolic panel ? ?Severe arthritis follow through with rheumatologist ?

## 2021-12-18 ENCOUNTER — Ambulatory Visit: Payer: PPO | Admitting: Physician Assistant

## 2021-12-18 ENCOUNTER — Encounter: Payer: Self-pay | Admitting: Physician Assistant

## 2021-12-18 VITALS — BP 94/60 | HR 77 | Resp 16 | Ht 67.5 in | Wt 183.0 lb

## 2021-12-18 DIAGNOSIS — M79672 Pain in left foot: Secondary | ICD-10-CM | POA: Diagnosis not present

## 2021-12-18 DIAGNOSIS — Z8719 Personal history of other diseases of the digestive system: Secondary | ICD-10-CM | POA: Diagnosis not present

## 2021-12-18 DIAGNOSIS — Z96653 Presence of artificial knee joint, bilateral: Secondary | ICD-10-CM

## 2021-12-18 DIAGNOSIS — Z85820 Personal history of malignant melanoma of skin: Secondary | ICD-10-CM

## 2021-12-18 DIAGNOSIS — R7303 Prediabetes: Secondary | ICD-10-CM

## 2021-12-18 DIAGNOSIS — Z79899 Other long term (current) drug therapy: Secondary | ICD-10-CM | POA: Diagnosis not present

## 2021-12-18 DIAGNOSIS — G894 Chronic pain syndrome: Secondary | ICD-10-CM | POA: Diagnosis not present

## 2021-12-18 DIAGNOSIS — Z862 Personal history of diseases of the blood and blood-forming organs and certain disorders involving the immune mechanism: Secondary | ICD-10-CM

## 2021-12-18 DIAGNOSIS — M79671 Pain in right foot: Secondary | ICD-10-CM

## 2021-12-18 DIAGNOSIS — Z85828 Personal history of other malignant neoplasm of skin: Secondary | ICD-10-CM

## 2021-12-18 DIAGNOSIS — Z8639 Personal history of other endocrine, nutritional and metabolic disease: Secondary | ICD-10-CM | POA: Diagnosis not present

## 2021-12-18 DIAGNOSIS — M0579 Rheumatoid arthritis with rheumatoid factor of multiple sites without organ or systems involvement: Secondary | ICD-10-CM

## 2021-12-18 NOTE — Patient Instructions (Signed)
Standing Labs ?We placed an order today for your standing lab work.  ? ?Please have your standing labs drawn in July and every 3 months  ? ?If possible, please have your labs drawn 2 weeks prior to your appointment so that the provider can discuss your results at your appointment. ? ?Please note that you may see your imaging and lab results in MyChart before we have reviewed them. ?We may be awaiting multiple results to interpret others before contacting you. ?Please allow our office up to 72 hours to thoroughly review all of the results before contacting the office for clarification of your results. ? ?We have open lab daily: ?Monday through Thursday from 1:30-4:30 PM and Friday from 1:30-4:00 PM ?at the office of Dr. Shaili Deveshwar, [facility].   ?Please be advised, all patients with office appointments requiring lab work will take precedent over walk-in lab work.  ?If possible, please come for your lab work on Monday and Friday afternoons, as you may experience shorter wait times. ?The office is located at [address], [address], [place], [place] [address] ?No appointment is necessary.   ?Labs are drawn by [facility]. Please bring your co-pay at the time of your lab draw.  You may receive a bill from [facility] for your lab work. ? ?Please note if you are on Hydroxychloroquine and and an order has been placed for a Hydroxychloroquine level, you will need to have it drawn 4 hours or more after your last dose. ? ?If you wish to have your labs drawn at another location, please call the office 24 hours in advance to send orders. ? ?If you have any questions regarding directions or hours of operation,  ?please call [phone].   ?As a reminder, please drink plenty of water prior to coming for your lab work. Thanks! ? ?

## 2021-12-18 NOTE — Addendum Note (Signed)
Addended by: Earnestine Mealing on: 12/18/2021 04:09 PM ? ? Modules accepted: Orders ? ?

## 2021-12-19 ENCOUNTER — Other Ambulatory Visit: Payer: Self-pay | Admitting: Rheumatology

## 2021-12-19 LAB — COMPLETE METABOLIC PANEL WITH GFR
AG Ratio: 1.6 (calc) (ref 1.0–2.5)
ALT: 12 U/L (ref 9–46)
AST: 25 U/L (ref 10–35)
Albumin: 4.1 g/dL (ref 3.6–5.1)
Alkaline phosphatase (APISO): 69 U/L (ref 35–144)
BUN: 16 mg/dL (ref 7–25)
CO2: 28 mmol/L (ref 20–32)
Calcium: 9 mg/dL (ref 8.6–10.3)
Chloride: 103 mmol/L (ref 98–110)
Creat: 0.94 mg/dL (ref 0.70–1.22)
Globulin: 2.6 g/dL (calc) (ref 1.9–3.7)
Glucose, Bld: 109 mg/dL — ABNORMAL HIGH (ref 65–99)
Potassium: 4.6 mmol/L (ref 3.5–5.3)
Sodium: 138 mmol/L (ref 135–146)
Total Bilirubin: 1.4 mg/dL — ABNORMAL HIGH (ref 0.2–1.2)
Total Protein: 6.7 g/dL (ref 6.1–8.1)
eGFR: 81 mL/min/{1.73_m2} (ref >=60)

## 2021-12-19 LAB — CBC WITH DIFFERENTIAL/PLATELET
Absolute Monocytes: 385 cells/uL (ref 200–950)
Basophils Absolute: 28 cells/uL (ref 0–200)
Basophils Relative: 0.5 %
Eosinophils Absolute: 253 cells/uL (ref 15–500)
Eosinophils Relative: 4.6 %
HCT: 33.2 % — ABNORMAL LOW (ref 38.5–50.0)
Hemoglobin: 11.1 g/dL — ABNORMAL LOW (ref 13.2–17.1)
Lymphs Abs: 2508 cells/uL (ref 850–3900)
MCH: 32.6 pg (ref 27.0–33.0)
MCHC: 33.4 g/dL (ref 32.0–36.0)
MCV: 97.6 fL (ref 80.0–100.0)
MPV: 10.2 fL (ref 7.5–12.5)
Monocytes Relative: 7 %
Neutro Abs: 2327 cells/uL (ref 1500–7800)
Neutrophils Relative %: 42.3 %
Platelets: 186 10*3/uL (ref 140–400)
RBC: 3.4 10*6/uL — ABNORMAL LOW (ref 4.20–5.80)
RDW: 14.3 % (ref 11.0–15.0)
Total Lymphocyte: 45.6 %
WBC: 5.5 10*3/uL (ref 3.8–10.8)

## 2021-12-19 MED ORDER — FOLIC ACID 1 MG PO TABS: 2.0000 mg | ORAL_TABLET | Freq: Every day | ORAL | 3 refills | Status: DC

## 2021-12-19 MED ORDER — METHOTREXATE SODIUM 2.5 MG PO TABS: 15.0000 mg | ORAL_TABLET | ORAL | 0 refills | Status: DC

## 2021-12-19 NOTE — Telephone Encounter (Signed)
Patient called the office stating he had an appointment yesterday and was supposed to have Methotrexate and folic acid sent in after his appointment. Patient states he called the pharmacy and they never received a prescription. Patient requests it be sent in urgently, as he is very low on medication. [facility] [place]. ?

## 2021-12-19 NOTE — Progress Notes (Signed)
Total bilirubin-1.4. rest of CMP WNL. RBC count, hgb, and hct are low but improving.  We will continue to monitor.

## 2021-12-19 NOTE — Telephone Encounter (Signed)
Patient is requesting refill to be sent to [facility] in [place]. ? ?Next Visit: 05/28/2022 ? ?Last Visit: 12/18/2021  ? ?Last Fill: 10/02/2021 (MTX) 9/59/7471 (Folic Acid) ? ?DX: Rheumatoid arthritis involving multiple sites with positive rheumatoid factor  ? ?Current Dose per office note 12/18/2021: Methotrexate 2.5 mg 6 tablets by mouth every 7 days and folic acid 1 mg 2 tablets daily ? ?Labs: 12/18/2021 Total bilirubin-1.4. rest of CMP WNL. RBC count, hgb, and hct are low but improving.  We will continue to monitor.    ? ?Okay to refill MTX and Folic Acid?  ?

## 2021-12-25 DIAGNOSIS — X32XXXD Exposure to sunlight, subsequent encounter: Secondary | ICD-10-CM | POA: Diagnosis not present

## 2021-12-25 DIAGNOSIS — L570 Actinic keratosis: Secondary | ICD-10-CM | POA: Diagnosis not present

## 2022-01-08 DIAGNOSIS — M79672 Pain in left foot: Secondary | ICD-10-CM | POA: Diagnosis not present

## 2022-01-08 DIAGNOSIS — I739 Peripheral vascular disease, unspecified: Secondary | ICD-10-CM | POA: Diagnosis not present

## 2022-01-08 DIAGNOSIS — M79675 Pain in left toe(s): Secondary | ICD-10-CM | POA: Diagnosis not present

## 2022-01-08 DIAGNOSIS — L110 Acquired keratosis follicularis: Secondary | ICD-10-CM | POA: Diagnosis not present

## 2022-01-08 DIAGNOSIS — L609 Nail disorder, unspecified: Secondary | ICD-10-CM | POA: Diagnosis not present

## 2022-01-08 DIAGNOSIS — M79674 Pain in right toe(s): Secondary | ICD-10-CM | POA: Diagnosis not present

## 2022-01-08 DIAGNOSIS — M79671 Pain in right foot: Secondary | ICD-10-CM | POA: Diagnosis not present

## 2022-01-13 ENCOUNTER — Telehealth: Payer: Self-pay | Admitting: Family Medicine

## 2022-01-13 NOTE — Telephone Encounter (Signed)
No answer unable to leave a message for patient to call back and schedule Medicare Annual Wellness Visit (AWV) in office.  ? ?If unable to come into the office for AWV,  please offer to do virtually or by telephone. ? ?Last AWV: 01/08/2021 ? ?Please schedule at anytime with [facility] Advisor. ? ?30 minute appointment for Virtual or phone ? ?45 minute appointment for in office or Initial virtual/phone ? ?Any questions, please contact me at [phone]  ?  ?  ?

## 2022-01-23 ENCOUNTER — Other Ambulatory Visit: Payer: Self-pay | Admitting: Family Medicine

## 2022-02-19 DIAGNOSIS — Z85820 Personal history of malignant melanoma of skin: Secondary | ICD-10-CM | POA: Diagnosis not present

## 2022-02-19 DIAGNOSIS — Z08 Encounter for follow-up examination after completed treatment for malignant neoplasm: Secondary | ICD-10-CM | POA: Diagnosis not present

## 2022-02-19 DIAGNOSIS — D0461 Carcinoma in situ of skin of right upper limb, including shoulder: Secondary | ICD-10-CM | POA: Diagnosis not present

## 2022-02-19 DIAGNOSIS — Z1283 Encounter for screening for malignant neoplasm of skin: Secondary | ICD-10-CM | POA: Diagnosis not present

## 2022-02-19 DIAGNOSIS — D225 Melanocytic nevi of trunk: Secondary | ICD-10-CM | POA: Diagnosis not present

## 2022-02-24 ENCOUNTER — Other Ambulatory Visit: Payer: Self-pay | Admitting: *Deleted

## 2022-02-24 DIAGNOSIS — Z79899 Other long term (current) drug therapy: Secondary | ICD-10-CM

## 2022-02-25 LAB — COMPLETE METABOLIC PANEL WITH GFR
AG Ratio: 1.4 (calc) (ref 1.0–2.5)
ALT: 11 U/L (ref 9–46)
AST: 20 U/L (ref 10–35)
Albumin: 3.9 g/dL (ref 3.6–5.1)
Alkaline phosphatase (APISO): 69 U/L (ref 35–144)
BUN: 17 mg/dL (ref 7–25)
CO2: 30 mmol/L (ref 20–32)
Calcium: 9 mg/dL (ref 8.6–10.3)
Chloride: 105 mmol/L (ref 98–110)
Creat: 1.02 mg/dL (ref 0.70–1.22)
Globulin: 2.8 g/dL (calc) (ref 1.9–3.7)
Glucose, Bld: 85 mg/dL (ref 65–99)
Potassium: 4.5 mmol/L (ref 3.5–5.3)
Sodium: 140 mmol/L (ref 135–146)
Total Bilirubin: 0.9 mg/dL (ref 0.2–1.2)
Total Protein: 6.7 g/dL (ref 6.1–8.1)
eGFR: 74 mL/min/{1.73_m2} (ref >=60)

## 2022-02-25 LAB — CBC WITH DIFFERENTIAL/PLATELET
Absolute Monocytes: 547 cells/uL (ref 200–950)
Basophils Absolute: 43 cells/uL (ref 0–200)
Basophils Relative: 0.6 %
Eosinophils Absolute: 440 cells/uL (ref 15–500)
Eosinophils Relative: 6.2 %
HCT: 34.3 % — ABNORMAL LOW (ref 38.5–50.0)
Hemoglobin: 11.2 g/dL — ABNORMAL LOW (ref 13.2–17.1)
Lymphs Abs: 3394 cells/uL (ref 850–3900)
MCH: 32.5 pg (ref 27.0–33.0)
MCHC: 32.7 g/dL (ref 32.0–36.0)
MCV: 99.4 fL (ref 80.0–100.0)
MPV: 9.8 fL (ref 7.5–12.5)
Monocytes Relative: 7.7 %
Neutro Abs: 2677 cells/uL (ref 1500–7800)
Neutrophils Relative %: 37.7 %
Platelets: 182 10*3/uL (ref 140–400)
RBC: 3.45 10*6/uL — ABNORMAL LOW (ref 4.20–5.80)
RDW: 14.5 % (ref 11.0–15.0)
Total Lymphocyte: 47.8 %
WBC: 7.1 10*3/uL (ref 3.8–10.8)

## 2022-03-05 ENCOUNTER — Encounter: Payer: Self-pay | Admitting: *Deleted

## 2022-03-12 ENCOUNTER — Other Ambulatory Visit: Payer: Self-pay | Admitting: *Deleted

## 2022-03-12 DIAGNOSIS — Z08 Encounter for follow-up examination after completed treatment for malignant neoplasm: Secondary | ICD-10-CM | POA: Diagnosis not present

## 2022-03-12 DIAGNOSIS — Z85828 Personal history of other malignant neoplasm of skin: Secondary | ICD-10-CM | POA: Diagnosis not present

## 2022-03-12 MED ORDER — METHOTREXATE SODIUM 2.5 MG PO TABS: 15.0000 mg | ORAL_TABLET | ORAL | 0 refills | Status: DC

## 2022-03-12 NOTE — Telephone Encounter (Signed)
Patient called the office stating he received the letter we mailed him about his labs. Patient provided [place] with an updated phone number. Reviewed results with patient. He requests refill of MTX to be sent to the [facility] in [place].  Next Visit: 05/28/2022  Last Visit: 12/18/2021  Last Fill: 12/19/2021  DX: Rheumatoid arthritis involving multiple sites with positive rheumatoid factor   Current Dose per office note 12/18/2021: Methotrexate 2.5 mg 6 tablets by mouth every 7 days   Labs: 02/24/2022 Hemoglobin is low and stable.  CMP is normal.  Okay to refill MTX?

## 2022-03-20 DIAGNOSIS — I739 Peripheral vascular disease, unspecified: Secondary | ICD-10-CM | POA: Diagnosis not present

## 2022-03-20 DIAGNOSIS — M1990 Unspecified osteoarthritis, unspecified site: Secondary | ICD-10-CM | POA: Diagnosis not present

## 2022-03-20 DIAGNOSIS — M79675 Pain in left toe(s): Secondary | ICD-10-CM | POA: Diagnosis not present

## 2022-03-20 DIAGNOSIS — M79672 Pain in left foot: Secondary | ICD-10-CM | POA: Diagnosis not present

## 2022-03-20 DIAGNOSIS — M79674 Pain in right toe(s): Secondary | ICD-10-CM | POA: Diagnosis not present

## 2022-03-20 DIAGNOSIS — M79671 Pain in right foot: Secondary | ICD-10-CM | POA: Diagnosis not present

## 2022-03-21 ENCOUNTER — Other Ambulatory Visit: Payer: Self-pay | Admitting: Family Medicine

## 2022-04-15 ENCOUNTER — Ambulatory Visit (INDEPENDENT_AMBULATORY_CARE_PROVIDER_SITE_OTHER): Payer: PPO | Admitting: Family Medicine

## 2022-04-15 ENCOUNTER — Encounter: Payer: Self-pay | Admitting: Family Medicine

## 2022-04-15 VITALS — BP 142/79 | HR 52 | Wt 185.4 lb

## 2022-04-15 DIAGNOSIS — D692 Other nonthrombocytopenic purpura: Secondary | ICD-10-CM | POA: Diagnosis not present

## 2022-04-15 DIAGNOSIS — N401 Enlarged prostate with lower urinary tract symptoms: Secondary | ICD-10-CM

## 2022-04-15 DIAGNOSIS — G894 Chronic pain syndrome: Secondary | ICD-10-CM

## 2022-04-15 DIAGNOSIS — M0579 Rheumatoid arthritis with rheumatoid factor of multiple sites without organ or systems involvement: Secondary | ICD-10-CM | POA: Diagnosis not present

## 2022-04-15 DIAGNOSIS — R3916 Straining to void: Secondary | ICD-10-CM | POA: Diagnosis not present

## 2022-04-15 NOTE — Progress Notes (Signed)
   Subjective:    Patient ID: Bryan Homans Sr., male    DOB: 11/03/1940, 81 y.o.   MRN: [id number]  HPI Pt arrives here for follow up. Pt is wanting to make sure he is taking the correct meds.  Patient with a history of severe rheumatoid arthritis here today for follow-up.  To go over his medications.  A friend of the family is with him Hulen Skains We are reviewed over his medications He seemed to have a decent grasp of things but he had difficulty putting the right medicines in his wife's containers which significantly raises the question that he may also benefit from having help with his medication such as a bubble pack Review of Systems     Objective:   Physical Exam Patient does have senile purpura Lungs are clear hearts regular pulse normal Extremities no edema. Very poor condition of his feet with calluses long toenails and rheumatoid arthritis changes  I reviewed over the lab work from June with the patient    Assessment & Plan:  1. Rheumatoid arthritis involving multiple sites with positive rheumatoid factor ([place]) He is seen by specialist on a regular basis.  But he also has significant rheumatoid arthritic changes in his feet He would benefit from seeing podiatry They are requesting podiatrist to be in [place] Apparently the previous place that was taking care of him told him that they "were not making enough money off and him and did not want to have him come back " - Ambulatory referral to Podiatry  2. [name] (HCC) Some bruising on the arms noted.  Very consistent with senile purpura  3. Benign prostatic hyperplasia (BPH) with straining on urination Recommend continuation of tamsulosin.  4. Chronic pain syndrome Tylenol 3 is when necessary for his pain  Patient would benefit from bubble pack we will try to get health team advantage consultation with healthcare nurse manager  Follow-up 4 months

## 2022-04-20 ENCOUNTER — Other Ambulatory Visit: Payer: Self-pay | Admitting: Family Medicine

## 2022-04-21 ENCOUNTER — Other Ambulatory Visit: Payer: Self-pay | Admitting: Family Medicine

## 2022-04-24 ENCOUNTER — Other Ambulatory Visit: Payer: Self-pay | Admitting: *Deleted

## 2022-04-24 DIAGNOSIS — M0579 Rheumatoid arthritis with rheumatoid factor of multiple sites without organ or systems involvement: Secondary | ICD-10-CM

## 2022-04-24 MED ORDER — FLUOXETINE HCL 20 MG PO CAPS: 20.0000 mg | ORAL_CAPSULE | Freq: Every day | ORAL | 1 refills | Status: DC

## 2022-04-27 ENCOUNTER — Other Ambulatory Visit: Payer: Self-pay | Admitting: Family Medicine

## 2022-04-28 NOTE — Progress Notes (Signed)
Tried calling Bryan Maldonado, mailbox is full.

## 2022-05-08 NOTE — Progress Notes (Signed)
Pt friend contacted and verbalized understanding. Bryan Maldonado will try to get patient to switch to [facility] and will give [place] a call back

## 2022-05-15 NOTE — Progress Notes (Deleted)
Office Visit Note  Patient: Bryan Chausse Goettel Sr.             Date of Birth: 11-Feb-1941           MRN: [id number]             PCP: Kathyrn Drown, MD Referring: Kathyrn Drown, MD Visit Date: 05/28/2022 Occupation: '@GUAROCC'$ @  Subjective:  No chief complaint on file.   History of Present Illness: Bryan WHETSTINE Sr. is a 81 y.o. male ***   Activities of Daily Living:  Patient reports morning stiffness for *** {minute/hour:19697}.   Patient {ACTIONS;DENIES/REPORTS:21021675::"Denies"} nocturnal pain.  Difficulty dressing/grooming: {ACTIONS;DENIES/REPORTS:21021675::"Denies"} Difficulty climbing stairs: {ACTIONS;DENIES/REPORTS:21021675::"Denies"} Difficulty getting out of chair: {ACTIONS;DENIES/REPORTS:21021675::"Denies"} Difficulty using hands for taps, buttons, cutlery, and/or writing: {ACTIONS;DENIES/REPORTS:21021675::"Denies"}  No Rheumatology ROS completed.   PMFS History:  Patient Active Problem List   Diagnosis Date Noted  . Bryan Maldonado (Bryan Maldonado) 01/29/2021  . Anemia, chronic disease 06/25/2020  . High risk medication use 09/19/2016  . History of total knee replacement, bilateral 09/19/2016  . Gastroesophageal reflux disease without esophagitis 09/19/2016  . History of Bryan Maldonado 09/19/2016  . History of basal cell carcinoma 09/19/2016  . History of anemia 09/19/2016  . Allergic rhinitis 02/18/2016  . Prediabetes 11/20/2014  . Hyperlipidemia 11/20/2014  . BPH (benign prostatic hyperplasia) 05/22/2014  . Rheumatoid arthritis involving multiple sites with positive rheumatoid factor ([place]) 01/05/2013  . Chronic pain syndrome 01/05/2013    Past Medical History:  Diagnosis Date  . Anemia   . Basal cell carcinoma   . BPH (benign prostatic hyperplasia)   . GERD (gastroesophageal reflux disease)   . Bryan Maldonado (Bryan Maldonado)   . Osteoarthritis   . Rheumatoid aortitis   . Rheumatoid arthritis (Bryan Maldonado)     Family History  Problem Relation Age of Onset  . Rheum arthritis Mother   .  Emphysema Father   . Cancer Brother   . Emphysema Sister    Past Surgical History:  Procedure Laterality Date  . CATARACT EXTRACTION W/PHACO Right 03/12/2020   Procedure: CATARACT EXTRACTION PHACO AND INTRAOCULAR LENS PLACEMENT RIGHT EYE;  Surgeon: Baruch Goldmann, MD;  Location: [facility];  Service: Ophthalmology;  Laterality: Right;  CDE: 9.99  . CATARACT EXTRACTION W/PHACO Left 03/26/2020   Procedure: CATARACT EXTRACTION PHACO AND INTRAOCULAR LENS PLACEMENT (IOC);  Surgeon: Baruch Goldmann, MD;  Location: [facility];  Service: Ophthalmology;  Laterality: Left;  CDE: 9.16   . COLONOSCOPY    . HERNIA REPAIR    . JOINT REPLACEMENT Bilateral   . KNEE ARTHROPLASTY    . Bryan Maldonado EXCISION    . Bryan Maldonado EXCISION  09/22/2018   on head   Social History   Social History Narrative  . Not on file   Immunization History  Administered Date(s) Administered  . Fluad Quad(high Dose 65+) 06/25/2020, 06/06/2021  . Influenza Split 05/19/2013  . Influenza, High Dose Seasonal PF 05/20/2019  . Influenza,inj,Quad PF,6+ Mos 05/22/2014, 05/22/2015, 06/06/2016, 06/10/2017, 05/14/2018  . Influenza-Unspecified 05/20/2019  . PFIZER(Purple Top)SARS-COV-2 Vaccination 11/14/2019, 12/05/2019  . Pneumococcal Conjugate-13 05/22/2014  . Pneumococcal Polysaccharide-23 05/19/2013  . Zoster Recombinat (Shingrix) 09/06/2018, 03/11/2019     Objective: Vital Signs: There were no vitals taken for this visit.   Physical Exam   Musculoskeletal Exam: ***  CDAI Exam: CDAI Score: -- Patient Global: --; Provider Global: -- Swollen: --; Tender: -- Joint Exam 05/28/2022   No joint exam has been documented for this visit   There is currently no information documented on the  homunculus. Go to the Rheumatology activity and complete the homunculus joint exam.  Investigation: No additional findings.  Imaging: No results found.  Recent Labs: Lab Results  Component Value Date   WBC 7.1 02/24/2022   HGB 11.2 (L)  02/24/2022   PLT 182 02/24/2022   NA 140 02/24/2022   K 4.5 02/24/2022   CL 105 02/24/2022   CO2 30 02/24/2022   GLUCOSE 85 02/24/2022   BUN 17 02/24/2022   CREATININE 1.02 02/24/2022   BILITOT 0.9 02/24/2022   ALKPHOS 79 09/23/2021   AST 20 02/24/2022   ALT 11 02/24/2022   PROT 6.7 02/24/2022   ALBUMIN 4.3 09/23/2021   CALCIUM 9.0 02/24/2022   GFRAA 96 01/14/2021    Speciality Comments: No specialty comments available.  Procedures:  No procedures performed Allergies: Morphine and related and Acyclovir and related   Assessment / Plan:     Visit Diagnoses: No diagnosis found.  Orders: No orders of the defined types were placed in this encounter.  No orders of the defined types were placed in this encounter.   Face-to-face time spent with patient was *** minutes. Greater than 50% of time was spent in counseling and coordination of care.  Follow-Up Instructions: No follow-ups on file.   Earnestine Mealing, CMA  Note - This record has been created using Editor, commissioning.  Chart creation errors have been sought, but may not always  have been located. Such creation errors do not reflect on  the standard of medical care.

## 2022-05-28 ENCOUNTER — Ambulatory Visit: Payer: PPO | Attending: Rheumatology | Admitting: Rheumatology

## 2022-05-28 DIAGNOSIS — Z96653 Presence of artificial knee joint, bilateral: Secondary | ICD-10-CM

## 2022-05-28 DIAGNOSIS — G894 Chronic pain syndrome: Secondary | ICD-10-CM

## 2022-05-28 DIAGNOSIS — Z8639 Personal history of other endocrine, nutritional and metabolic disease: Secondary | ICD-10-CM

## 2022-05-28 DIAGNOSIS — Z85828 Personal history of other malignant neoplasm of skin: Secondary | ICD-10-CM

## 2022-05-28 DIAGNOSIS — M79671 Pain in right foot: Secondary | ICD-10-CM

## 2022-05-28 DIAGNOSIS — R7303 Prediabetes: Secondary | ICD-10-CM

## 2022-05-28 DIAGNOSIS — X32XXXD Exposure to sunlight, subsequent encounter: Secondary | ICD-10-CM | POA: Diagnosis not present

## 2022-05-28 DIAGNOSIS — Z79899 Other long term (current) drug therapy: Secondary | ICD-10-CM

## 2022-05-28 DIAGNOSIS — Z08 Encounter for follow-up examination after completed treatment for malignant neoplasm: Secondary | ICD-10-CM | POA: Diagnosis not present

## 2022-05-28 DIAGNOSIS — Z85820 Personal history of malignant melanoma of skin: Secondary | ICD-10-CM

## 2022-05-28 DIAGNOSIS — Z8719 Personal history of other diseases of the digestive system: Secondary | ICD-10-CM

## 2022-05-28 DIAGNOSIS — M0579 Rheumatoid arthritis with rheumatoid factor of multiple sites without organ or systems involvement: Secondary | ICD-10-CM

## 2022-05-28 DIAGNOSIS — L570 Actinic keratosis: Secondary | ICD-10-CM | POA: Diagnosis not present

## 2022-05-28 DIAGNOSIS — Z862 Personal history of diseases of the blood and blood-forming organs and certain disorders involving the immune mechanism: Secondary | ICD-10-CM

## 2022-05-28 DIAGNOSIS — Z1283 Encounter for screening for malignant neoplasm of skin: Secondary | ICD-10-CM | POA: Diagnosis not present

## 2022-05-28 DIAGNOSIS — D225 Melanocytic nevi of trunk: Secondary | ICD-10-CM | POA: Diagnosis not present

## 2022-05-29 ENCOUNTER — Other Ambulatory Visit: Payer: Self-pay | Admitting: *Deleted

## 2022-05-29 DIAGNOSIS — Z79899 Other long term (current) drug therapy: Secondary | ICD-10-CM

## 2022-05-30 LAB — COMPLETE METABOLIC PANEL WITH GFR
AG Ratio: 1.5 (calc) (ref 1.0–2.5)
ALT: 15 U/L (ref 9–46)
AST: 21 U/L (ref 10–35)
Albumin: 4.3 g/dL (ref 3.6–5.1)
Alkaline phosphatase (APISO): 74 U/L (ref 35–144)
BUN: 20 mg/dL (ref 7–25)
CO2: 29 mmol/L (ref 20–32)
Calcium: 9.1 mg/dL (ref 8.6–10.3)
Chloride: 103 mmol/L (ref 98–110)
Creat: 0.99 mg/dL (ref 0.70–1.22)
Globulin: 2.8 g/dL (calc) (ref 1.9–3.7)
Glucose, Bld: 134 mg/dL — ABNORMAL HIGH (ref 65–99)
Potassium: 4.8 mmol/L (ref 3.5–5.3)
Sodium: 140 mmol/L (ref 135–146)
Total Bilirubin: 1.4 mg/dL — ABNORMAL HIGH (ref 0.2–1.2)
Total Protein: 7.1 g/dL (ref 6.1–8.1)
eGFR: 77 mL/min/{1.73_m2} (ref >=60)

## 2022-05-30 LAB — CBC WITH DIFFERENTIAL/PLATELET
Absolute Monocytes: 408 cells/uL (ref 200–950)
Basophils Absolute: 11 cells/uL (ref 0–200)
Basophils Relative: 0.2 %
Eosinophils Absolute: 212 cells/uL (ref 15–500)
Eosinophils Relative: 4 %
HCT: 35.4 % — ABNORMAL LOW (ref 38.5–50.0)
Hemoglobin: 12 g/dL — ABNORMAL LOW (ref 13.2–17.1)
Lymphs Abs: 2687 cells/uL (ref 850–3900)
MCH: 33.6 pg — ABNORMAL HIGH (ref 27.0–33.0)
MCHC: 33.9 g/dL (ref 32.0–36.0)
MCV: 99.2 fL (ref 80.0–100.0)
MPV: 9.9 fL (ref 7.5–12.5)
Monocytes Relative: 7.7 %
Neutro Abs: 1982 cells/uL (ref 1500–7800)
Neutrophils Relative %: 37.4 %
Platelets: 181 10*3/uL (ref 140–400)
RBC: 3.57 10*6/uL — ABNORMAL LOW (ref 4.20–5.80)
RDW: 13.6 % (ref 11.0–15.0)
Total Lymphocyte: 50.7 %
WBC: 5.3 10*3/uL (ref 3.8–10.8)

## 2022-06-02 ENCOUNTER — Other Ambulatory Visit: Payer: Self-pay | Admitting: Physician Assistant

## 2022-06-02 NOTE — Telephone Encounter (Signed)
Next Visit: 06/24/2022  Last Visit: 12/18/2021  Last Fill: 03/12/2022  DX: Rheumatoid arthritis involving multiple sites with positive rheumatoid factor    Current Dose per office note 12/18/2021: Methotrexate 2.5 mg 6 tablets by mouth every 7 days   Labs: 05/29/2022 Glucose is 134.  Total bilirubin is borderline elevated-1.4. rest of CMP WNL.  Anemia has improved slightly.   Okay to refill MTX?

## 2022-06-05 ENCOUNTER — Telehealth: Payer: Self-pay | Admitting: Family Medicine

## 2022-06-05 NOTE — Telephone Encounter (Signed)
No answer unable to leave a message for patient to call back and schedule Medicare Annual Wellness Visit (AWV).  Please offer to do virtually or by telephone.  Last AWV: 01/08/2021  Please schedule at any time with [facility] Ad[facility].  30 minute appointment for Virtual or phone  45 minute appointment for  Initial virtual/phone  Any questions, please contact me at [phone]

## 2022-06-10 NOTE — Progress Notes (Signed)
Office Visit Note  Patient: Bryan Bulthuis Wickard Sr.             Date of Birth: 02/22/1941           MRN: [id number]             PCP: Kathyrn Drown, MD Referring: Kathyrn Drown, MD Visit Date: 06/24/2022 Occupation: '@GUAROCC'$ @  Subjective:  Joint stiffness   History of Present Illness: Bryan GUNDERSON Sr. is a 81 y.o. male with history of seropositive rheumatoid arthritis. He remains on Methotrexate 2.5 mg 6 tablets by mouth every 7 days and folic acid 1 mg 2 tablets daily.  He denies any recent rheumatoid arthritis flares.  He has intermittent pain and stiffness in both hands and both wrist joints. He denies any joint swelling. He is taking tylenol  650 mg 2 tablets daily as needed for pain relief.  He denies any recent infections.  He is planning on getting the annual flu shot.  He denies any new medical conditions.     Activities of Daily Living:  Patient reports morning stiffness for 15-20 minutes.   Patient Reports nocturnal pain.  Difficulty dressing/grooming: Reports Difficulty climbing stairs: Denies Difficulty getting out of chair: Denies Difficulty using hands for taps, buttons, cutlery, and/or writing: Reports  Review of Systems  Constitutional:  Positive for fatigue.  HENT:  Positive for mouth dryness. Negative for mouth sores.   Eyes:  Positive for dryness.  Respiratory:  Positive for shortness of breath.   Cardiovascular:  Negative for chest pain and palpitations.  Gastrointestinal:  Negative for blood in stool, constipation and diarrhea.  Endocrine: Positive for increased urination.  Genitourinary:  Negative for involuntary urination.  Musculoskeletal:  Positive for joint pain, joint pain, myalgias, muscle weakness, morning stiffness, muscle tenderness and myalgias. Negative for gait problem and joint swelling.  Skin:  Positive for rash. Negative for color change and sensitivity to sunlight.  Allergic/Immunologic: Negative for susceptible to infections.   Neurological:  Positive for dizziness and headaches.  Hematological:  Negative for swollen glands.  Psychiatric/Behavioral:  Positive for sleep disturbance. Negative for depressed mood. The patient is nervous/anxious.     PMFS History:  Patient Active Problem List   Diagnosis Date Noted   [name] ([place]) 01/29/2021   Anemia, chronic disease 06/25/2020   High risk medication use 09/19/2016   History of total knee replacement, bilateral 09/19/2016   Gastroesophageal reflux disease without esophagitis 09/19/2016   History of melanoma 09/19/2016   History of basal cell carcinoma 09/19/2016   History of anemia 09/19/2016   Allergic rhinitis 02/18/2016   Prediabetes 11/20/2014   Hyperlipidemia 11/20/2014   BPH (benign prostatic hyperplasia) 05/22/2014   Rheumatoid arthritis involving multiple sites with positive rheumatoid factor ([place]) 01/05/2013   Chronic pain syndrome 01/05/2013    Past Medical History:  Diagnosis Date   Anemia    Basal cell carcinoma    BPH (benign prostatic hyperplasia)    GERD (gastroesophageal reflux disease)    Melanoma (HCC)    Osteoarthritis    Rheumatoid aortitis    Rheumatoid arthritis ([place])     Family History  Problem Relation Age of Onset   Rheum arthritis Mother    Emphysema Father    Cancer Brother    Emphysema Sister    Past Surgical History:  Procedure Laterality Date   CATARACT EXTRACTION W/PHACO Right 03/12/2020   Procedure: CATARACT EXTRACTION PHACO AND INTRAOCULAR LENS PLACEMENT RIGHT EYE;  Surgeon: Baruch Goldmann, MD;  Location: [facility];  Service: Ophthalmology;  Laterality: Right;  CDE: 9.99   CATARACT EXTRACTION W/PHACO Left 03/26/2020   Procedure: CATARACT EXTRACTION PHACO AND INTRAOCULAR LENS PLACEMENT (I[facility]);  Surgeon: Baruch Goldmann, MD;  Location: [facility];  Service: Ophthalmology;  Laterality: Left;  CDE: 9.16    COLONOSCOPY     HERNIA REPAIR     JOINT REPLACEMENT Bilateral    KNEE ARTHROPLASTY     MELANOMA EXCISION      MELANOMA EXCISION  09/22/2018   on head   Social History   Social History Narrative   Not on file   Immunization History  Administered Date(s) Administered   Fluad Quad(high Dose 65+) 06/25/2020, 06/06/2021   Influenza Split 05/19/2013   Influenza, High Dose Seasonal PF 05/20/2019   Influenza,inj,Quad PF,6+ Mos 05/22/2014, 05/22/2015, 06/06/2016, 06/10/2017, 05/14/2018   Influenza-Unspecified 05/20/2019   PFIZER(Purple Top)SARS-COV-2 Vaccination 11/14/2019, 12/05/2019   Pneumococcal Conjugate-13 05/22/2014   Pneumococcal Polysaccharide-23 05/19/2013   Zoster Recombinat (Shingrix) 09/06/2018, 03/11/2019     Objective: Vital Signs: BP 104/65 (BP Location: Left Arm, Patient Position: Sitting, Cuff Size: Normal)   Pulse 79   Resp 16   Ht '5\' 8"'$  (1.727 m)   Wt 185 lb 9.6 oz (84.2 kg)   BMI 28.22 kg/m    Physical Exam Vitals and nursing note reviewed.  Constitutional:      Appearance: He is well-developed.  HENT:     Head: Normocephalic and atraumatic.  Eyes:     Conjunctiva/sclera: Conjunctivae normal.     Pupils: Pupils are equal, round, and reactive to light.  Cardiovascular:     Rate and Rhythm: Normal rate and regular rhythm.     Heart sounds: Normal heart sounds.  Pulmonary:     Effort: Pulmonary effort is normal.     Breath sounds: Normal breath sounds.  Abdominal:     General: Bowel sounds are normal.     Palpations: Abdomen is soft.  Musculoskeletal:     Cervical back: Normal range of motion and neck supple.  Skin:    General: Skin is warm and dry.     Capillary Refill: Capillary refill takes less than 2 seconds.  Neurological:     Mental Status: He is alert and oriented to person, place, and time.  Psychiatric:        Behavior: Behavior normal.      Musculoskeletal Exam: C-spine has limited range of motion.  Thoracic kyphosis noted.  No midline spinal tenderness.  Shoulder joint abduction to about 90 degrees.  Right elbow joint contracture noted.  Both  wrist joints are fused.  Synovial thickening of both wrist as well as MCP joints.  Swan-neck deformity of all PIP joints.  Incomplete fist formation noted bilaterally.  Limited extension of the left knee replacement.  Right knee replacement has no warmth or effusion.  Synovial thickening of both ankle joints.   CDAI Exam: CDAI Score: -- Patient Global: 3 mm; Provider Global: 3 mm Swollen: --; Tender: -- Joint Exam 06/24/2022   No joint exam has been documented for this visit   There is currently no information documented on the homunculus. Go to the Rheumatology activity and complete the homunculus joint exam.  Investigation: No additional findings.  Imaging: No results found.  Recent Labs: Lab Results  Component Value Date   WBC 5.3 05/29/2022   HGB 12.0 (L) 05/29/2022   PLT 181 05/29/2022   NA 140 05/29/2022   K 4.8 05/29/2022   CL 103 05/29/2022  CO2 29 05/29/2022   GLUCOSE 134 (H) 05/29/2022   BUN 20 05/29/2022   CREATININE 0.99 05/29/2022   BILITOT 1.4 (H) 05/29/2022   ALKPHOS 79 09/23/2021   AST 21 05/29/2022   ALT 15 05/29/2022   PROT 7.1 05/29/2022   ALBUMIN 4.3 09/23/2021   CALCIUM 9.1 05/29/2022   GFRAA 96 01/14/2021    Speciality Comments: No specialty comments available.  Procedures:  No procedures performed Allergies: Morphine and related and Acyclovir and related   Assessment / Plan:     Visit Diagnoses: Rheumatoid arthritis involving multiple sites with positive rheumatoid factor (HCC) - Positive rheumatoid factor, positive anti-CCP, severe erosive disease with nodulosis and contractures: He has no active synovitis on examination today.  He has contractures involving several joints including the right elbow.  Both wrist joints are fused.  He has synovial thickening of both wrist joints and MCP joints, and swan-neck deformity of PIP joints.  He has occasional discomfort and stiffness in both wrist joints but has no active inflammation at this time.   Overall his rheumatoid arthritis remains stable taking methotrexate 6 tablets by mouth once weekly and folic acid 2 mg daily.  He continues to tolerate methotrexate without any side effects and has not missed any doses recently.  He has not had any recent or recurrent infections.  He will remain on the current treatment regimen.  He was advised to notify us if he develops increased joint pain or joint swelling.  He will follow-up in the office in 5 months or sooner if needed.  High risk medication use - Methotrexate 2.5 mg 6 tablets by mouth every 7 days and folic acid 1 mg 2 tablets daily.  CBC and CMP updated on 05/29/22.  His next lab work will be due in December and every 3 months.  Standing orders for CBC and CMP remain in place. He has not had any recent or recurrent infections.  Discussed the importance of holding methotrexate if he develops signs or symptoms of an infection and to resume once the infection has completely cleared.  Patient is planning on proceeding with getting the annual flu shot.  History of total knee replacement, bilateral: Limited extension of left knee.  No warmth or effusion.    Pain in both feet - X-rays of both feet from 05/07/2018 were consistent with severe erosive rheumatoid arthritis.  He has overcrowding and subluxation of all MTP joints.   Chronic pain syndrome - Patient is no longer taking Tylenol #3.  He has been taking Tylenol 650 mg 2 tablets daily for pain relief.  Other medical conditions are listed as follows:   Prediabetes  History of hyperlipidemia  History of melanoma - He is followed by dermatologist every 6 months per patient.  History of anemia: Red blood cell, hemoglobin, and hematocrit remain slightly low but have been trending up.  Hemoglobin was 12.0 and hematocrit was 35.4 on 05/29/2022.  MCH was borderline elevated.  Encouraged the patient to continue to take folic acid 2 mg daily.  History of gastroesophageal reflux (GERD)  History of  basal cell carcinoma  Orders: No orders of the defined types were placed in this encounter.  No orders of the defined types were placed in this encounter.     Follow-Up Instructions: Return in about 5 months (around 11/23/2022) for Rheumatoid arthritis.   Ofilia Neas, PA-C  Note - This record has been created using Dragon software.  Chart creation errors have been sought, but may not  always  have been located. Such creation errors do not reflect on  the standard of medical care.

## 2022-06-23 ENCOUNTER — Other Ambulatory Visit: Payer: Self-pay

## 2022-06-23 MED ORDER — POTASSIUM CHLORIDE CRYS ER 20 MEQ PO TBCR: EXTENDED_RELEASE_TABLET | ORAL | 5 refills | Status: DC

## 2022-06-23 MED ORDER — OMEPRAZOLE 20 MG PO CPDR: DELAYED_RELEASE_CAPSULE | ORAL | 0 refills | Status: DC

## 2022-06-24 ENCOUNTER — Ambulatory Visit: Payer: PPO | Attending: Rheumatology | Admitting: Physician Assistant

## 2022-06-24 ENCOUNTER — Encounter: Payer: Self-pay | Admitting: Physician Assistant

## 2022-06-24 VITALS — BP 104/65 | HR 79 | Resp 16 | Ht 68.0 in | Wt 185.6 lb

## 2022-06-24 DIAGNOSIS — Z85820 Personal history of malignant melanoma of skin: Secondary | ICD-10-CM

## 2022-06-24 DIAGNOSIS — M79672 Pain in left foot: Secondary | ICD-10-CM | POA: Diagnosis not present

## 2022-06-24 DIAGNOSIS — G894 Chronic pain syndrome: Secondary | ICD-10-CM | POA: Diagnosis not present

## 2022-06-24 DIAGNOSIS — Z8639 Personal history of other endocrine, nutritional and metabolic disease: Secondary | ICD-10-CM

## 2022-06-24 DIAGNOSIS — Z8719 Personal history of other diseases of the digestive system: Secondary | ICD-10-CM | POA: Diagnosis not present

## 2022-06-24 DIAGNOSIS — R7303 Prediabetes: Secondary | ICD-10-CM

## 2022-06-24 DIAGNOSIS — M79671 Pain in right foot: Secondary | ICD-10-CM

## 2022-06-24 DIAGNOSIS — Z96653 Presence of artificial knee joint, bilateral: Secondary | ICD-10-CM

## 2022-06-24 DIAGNOSIS — Z862 Personal history of diseases of the blood and blood-forming organs and certain disorders involving the immune mechanism: Secondary | ICD-10-CM

## 2022-06-24 DIAGNOSIS — Z79899 Other long term (current) drug therapy: Secondary | ICD-10-CM

## 2022-06-24 DIAGNOSIS — Z85828 Personal history of other malignant neoplasm of skin: Secondary | ICD-10-CM | POA: Diagnosis not present

## 2022-06-24 DIAGNOSIS — M0579 Rheumatoid arthritis with rheumatoid factor of multiple sites without organ or systems involvement: Secondary | ICD-10-CM

## 2022-06-24 NOTE — Patient Instructions (Addendum)
Standing Labs We placed an order today for your standing lab work.   Please have your standing labs drawn in December and every 3 months   Please have your labs drawn 2 weeks prior to your appointment so that the provider can discuss your lab results at your appointment.  Please note that you may see your imaging and lab results in [place] before we have reviewed them. We will contact you once all results are reviewed. Please allow our office up to 72 hours to thoroughly review all of the results before contacting the office for clarification of your results.  Lab hours are: Monday through Thursday from 1:30 pm-4:30 pm and Friday from 1:30 pm- 4:00 pm  You may experience shorter wait times on Monday, Thursday or Friday afternoons,.   Effective June 30, 2022, new lab hours will be: Monday through Thursday from 8:00 am -12:30 pm and 1:00 pm-5:00 pm and Friday from 8:00 am-12:00 pm.  Please be advised, all patients with office appointments requiring lab work will take precedent over walk-in lab work.   Labs are drawn by [facility]. Please bring your co-pay at the time of your lab draw.  You may receive a bill from [facility] for your lab work.  Please note if you are on Hydroxychloroquine and and an order has been placed for a Hydroxychloroquine level, you will need to have it drawn 4 hours or more after your last dose.  If you wish to have your labs drawn at another location, please call the office 24 hours in advance so we can fax the orders.  The office is located at [address] [place], [place], [place] [address] No appointment is necessary.    If you have any questions regarding directions or hours of operation,  please call [phone].   As a reminder, please drink plenty of water prior to coming for your lab work. Thanks!            If you have signs or symptoms of an infection or start antibiotics: First, call your PCP for workup of your infection. Hold your  medication through the infection, until you complete your antibiotics, and until symptoms resolve if you take the following:  Methotrexate    Hand Exercises Hand exercises can be helpful for almost anyone. These exercises can strengthen the hands, improve flexibility and movement, and increase blood flow to the hands. These results can make work and daily tasks easier. Hand exercises can be especially helpful for people who have joint pain from arthritis or have nerve damage from overuse (carpal tunnel syndrome). These exercises can also help people who have injured a hand. Exercises Most of these hand exercises are gentle stretching and motion exercises. It is usually safe to do them often throughout the day. Warming up your hands before exercise may help to reduce stiffness. You can do this with gentle massage or by placing your hands in warm water for 10-15 minutes. It is normal to feel some stretching, pulling, tightness, or mild discomfort as you begin new exercises. This will gradually improve. Stop an exercise right away if you feel sudden, severe pain or your pain gets worse. Ask your health care provider which exercises are best for you. Knuckle bend or "claw" fist  Stand or sit with your arm, hand, and all five fingers pointed straight up. Make sure to keep your wrist straight during the exercise. Gently bend your fingers down toward your palm until the tips of your fingers are touching  the top of your palm. Keep your big knuckle straight and just bend the small knuckles in your fingers. Hold this position for __________ seconds. Straighten (extend) your fingers back to the starting position. Repeat this exercise 5-10 times with each hand. Full finger fist  Stand or sit with your arm, hand, and all five fingers pointed straight up. Make sure to keep your wrist straight during the exercise. Gently bend your fingers into your palm until the tips of your fingers are touching the middle  of your palm. Hold this position for __________ seconds. Extend your fingers back to the starting position, stretching every joint fully. Repeat this exercise 5-10 times with each hand. Straight fist Stand or sit with your arm, hand, and all five fingers pointed straight up. Make sure to keep your wrist straight during the exercise. Gently bend your fingers at the big knuckle, where your fingers meet your hand, and the middle knuckle. Keep the knuckle at the tips of your fingers straight and try to touch the bottom of your palm. Hold this position for __________ seconds. Extend your fingers back to the starting position, stretching every joint fully. Repeat this exercise 5-10 times with each hand. Tabletop  Stand or sit with your arm, hand, and all five fingers pointed straight up. Make sure to keep your wrist straight during the exercise. Gently bend your fingers at the big knuckle, where your fingers meet your hand, as far down as you can while keeping the small knuckles in your fingers straight. Think of forming a tabletop with your fingers. Hold this position for __________ seconds. Extend your fingers back to the starting position, stretching every joint fully. Repeat this exercise 5-10 times with each hand. Finger spread  Place your hand flat on a table with your palm facing down. Make sure your wrist stays straight as you do this exercise. Spread your fingers and thumb apart from each other as far as you can until you feel a gentle stretch. Hold this position for __________ seconds. Bring your fingers and thumb tight together again. Hold this position for __________ seconds. Repeat this exercise 5-10 times with each hand. Making circles  Stand or sit with your arm, hand, and all five fingers pointed straight up. Make sure to keep your wrist straight during the exercise. Make a circle by touching the tip of your thumb to the tip of your index finger. Hold for __________ seconds. Then  open your hand wide. Repeat this motion with your thumb and each finger on your hand. Repeat this exercise 5-10 times with each hand. Thumb motion  Sit with your forearm resting on a table and your wrist straight. Your thumb should be facing up toward the ceiling. Keep your fingers relaxed as you move your thumb. Lift your thumb up as high as you can toward the ceiling. Hold for __________ seconds. Bend your thumb across your palm as far as you can, reaching the tip of your thumb for the small finger (pinkie) side of your palm. Hold for __________ seconds. Repeat this exercise 5-10 times with each hand. Grip strengthening  Hold a stress ball or other soft ball in the middle of your hand. Slowly increase the pressure, squeezing the ball as much as you can without causing pain. Think of bringing the tips of your fingers into the middle of your palm. All of your finger joints should bend when doing this exercise. Hold your squeeze for __________ seconds, then relax. Repeat this exercise 5-10 times  with each hand. Contact a health care provider if: Your hand pain or discomfort gets much worse when you do an exercise. Your hand pain or discomfort does not improve within 2 hours after you exercise. If you have any of these problems, stop doing these exercises right away. Do not do them again unless your health care provider says that you can. Get help right away if: You develop sudden, severe hand pain or swelling. If this happens, stop doing these exercises right away. Do not do them again unless your health care provider says that you can. This information is not intended to replace advice given to you by your health care provider. Make sure you discuss any questions you have with your health care provider. Document Revised: 12/06/2020 Document Reviewed: 12/06/2020 Elsevier Patient Education  [place].

## 2022-07-29 ENCOUNTER — Other Ambulatory Visit: Payer: Self-pay | Admitting: Family Medicine

## 2022-07-29 ENCOUNTER — Encounter: Payer: Self-pay | Admitting: Nurse Practitioner

## 2022-07-29 ENCOUNTER — Ambulatory Visit (INDEPENDENT_AMBULATORY_CARE_PROVIDER_SITE_OTHER): Payer: PPO | Admitting: Nurse Practitioner

## 2022-07-29 DIAGNOSIS — Z0000 Encounter for general adult medical examination without abnormal findings: Secondary | ICD-10-CM

## 2022-07-29 NOTE — Progress Notes (Signed)
   Subjective:    Patient ID: Bryan Homans Sr., male    DOB: October 30, 1940, 81 y.o.   MRN: [id number]  HPI Virtual Visit via Telephone Note   I connected with Bryan Mans Leger Sr. on 07/29/22 at  There are other unrelated non-urgent complaints, but due to the busy schedule and the amount of time I've already spent with him, time does not permit me to address these routine issues at today's visit. I've requested another appointment to review these additional issues. by telephone and verified that I am speaking with the correct person using two identifiers.   Location: office  Patient: home  Provider: office   I discussed the limitations, risks, security and privacy concerns of performing an evaluation and management service by telephone and the availability of in person appointments. I also discussed with the patient that there may be a patient responsible charge related to this service. The patient expressed understanding and agreed to proceed.  I provided 15 minutes of non-face-to-face time during this encounter.  ------------------------------------------------------------------------------------------------  AWV- Annual Wellness Visit  The patient was seen for their annual wellness visit. The patient's past medical history, surgical history, and family history were reviewed. Pertinent vaccines were reviewed ( tetanus, pneumonia, shingles, flu) The patient's medication list was reviewed and updated.  The height and weight were entered.  BMI recorded in electronic record elsewhere  Cognitive screening was completed. Outcome of Mini - Cog: unable to complete   Falls /depression screening electronically recorded within record elsewhere  Current tobacco usage:none (All patients who use tobacco were given written and verbal information on quitting)  Recent listing of emergency department/hospitalizations over the past year were reviewed.  current specialist the patient sees on a  regular basis: Rheumatology, Dermatology   Medicare annual wellness visit patient questionnaire was reviewed.  A written screening schedule for the patient for the next 5-10 years was given. Appropriate discussion of followup regarding next visit was discussed.      Review of Systems  All other systems reviewed and are negative.      Objective:   Physical Exam  No signs of distress noted during telephone encounter.      Assessment & Plan:   1. Encounter for Medicare annual wellness exam -Adult wellness-complete.wellness physical was conducted today. Importance of diet and exercise were discussed in detail.  Importance of stress reduction and healthy living were discussed.  In addition to this a discussion regarding safety was also covered.  We also reviewed over immunizations and gave recommendations regarding current immunization needed for age.   In addition to this additional areas were also touched on including: Preventative health exams needed:  Colonoscopy no longer indicated Influenza overdue. Patient to get on 08/14/2022 during office visit.   Patient was advised yearly wellness exam

## 2022-07-29 NOTE — Patient Instructions (Addendum)
Thank you for coming for your annual wellness visit.  Please follow through on any advice that was given to you by today's visit. Remember to maintain compliance with your medications as discussed today.  Also remember it is important to eat a healthy diet and to stay physically active on a daily basis.  Please follow through with any testing or recommended followup office visits as was discussed today. You are due the following test coming up:  Colonoscopy no longer indicated Influenza overdue. Patient to get on 08/14/2022 during office visit.        Finally remembered that the annual wellness visit does not take the place of regularly scheduled office visits  chronic health problems such as hypertension/diabetes/cholesterol visits.

## 2022-08-06 ENCOUNTER — Other Ambulatory Visit: Payer: Self-pay | Admitting: Family Medicine

## 2022-08-07 ENCOUNTER — Other Ambulatory Visit: Payer: Self-pay

## 2022-08-07 DIAGNOSIS — M0579 Rheumatoid arthritis with rheumatoid factor of multiple sites without organ or systems involvement: Secondary | ICD-10-CM

## 2022-08-07 MED ORDER — FLUOXETINE HCL 20 MG PO CAPS: 20.0000 mg | ORAL_CAPSULE | Freq: Every day | ORAL | 1 refills | Status: DC

## 2022-08-14 ENCOUNTER — Ambulatory Visit (INDEPENDENT_AMBULATORY_CARE_PROVIDER_SITE_OTHER): Payer: PPO | Admitting: Family Medicine

## 2022-08-14 VITALS — BP 124/68 | HR 62 | Temp 97.8°F | Wt 188.6 lb

## 2022-08-14 DIAGNOSIS — N401 Enlarged prostate with lower urinary tract symptoms: Secondary | ICD-10-CM

## 2022-08-14 DIAGNOSIS — R3916 Straining to void: Secondary | ICD-10-CM | POA: Diagnosis not present

## 2022-08-14 DIAGNOSIS — F324 Major depressive disorder, single episode, in partial remission: Secondary | ICD-10-CM

## 2022-08-14 DIAGNOSIS — M0579 Rheumatoid arthritis with rheumatoid factor of multiple sites without organ or systems involvement: Secondary | ICD-10-CM | POA: Diagnosis not present

## 2022-08-14 MED ORDER — POTASSIUM CHLORIDE CRYS ER 20 MEQ PO TBCR: EXTENDED_RELEASE_TABLET | ORAL | 5 refills | Status: DC

## 2022-08-14 MED ORDER — TAMSULOSIN HCL 0.4 MG PO CAPS: ORAL_CAPSULE | ORAL | 5 refills | Status: DC

## 2022-08-14 MED ORDER — FUROSEMIDE 20 MG PO TABS: ORAL_TABLET | ORAL | 1 refills | Status: DC

## 2022-08-14 MED ORDER — ATORVASTATIN CALCIUM 10 MG PO TABS: ORAL_TABLET | ORAL | 1 refills | Status: DC

## 2022-08-14 MED ORDER — FLUOXETINE HCL 20 MG PO CAPS: 20.0000 mg | ORAL_CAPSULE | Freq: Every day | ORAL | 5 refills | Status: DC

## 2022-08-14 NOTE — Progress Notes (Signed)
   Subjective:    Patient ID: Bryan Homans Sr., male    DOB: 01-08-1941, 81 y.o.   MRN: [id number]  HPI Patient in today for follow-up He is here today for follow-up regarding his medications He is somewhat confused about which medicines to take We went over each individual 1    Review of Systems     Objective:   Physical Exam  General-in no acute distress Eyes-no discharge Lungs-respiratory rate normal, CTA CV-no murmurs,RRR Extremities skin warm dry no edema Neuro grossly normal Behavior normal, alert Patient with severe rheumatoid arthritis      Assessment & Plan:  1. Rheumatoid arthritis involving multiple sites with positive rheumatoid factor (HCC) Continue his current medication.  Follow through with rheumatologist. - FLUoxetine (PROZAC) 20 MG capsule; Take 1 capsule (20 mg total) by mouth daily.  Dispense: 30 capsule; Refill: 5 - Basic Metabolic Panel (7) - Hepatic function panel - Lipid panel  2. Benign prostatic hyperplasia (BPH) with straining on urination Taking tamsulosin at nighttime, tolerating well - Basic Metabolic Panel (7) - Hepatic function panel - Lipid panel  3. Depression, major, single episode, in partial remission ([place]) Denies any suicidal ideation.  Patient still has some symptoms of depression but tolerating medicine well  Labs ordered Refills ordered Medications were reviewed  Follow-up spring

## 2022-08-26 DIAGNOSIS — N401 Enlarged prostate with lower urinary tract symptoms: Secondary | ICD-10-CM | POA: Diagnosis not present

## 2022-08-26 DIAGNOSIS — R3916 Straining to void: Secondary | ICD-10-CM | POA: Diagnosis not present

## 2022-08-26 DIAGNOSIS — M0579 Rheumatoid arthritis with rheumatoid factor of multiple sites without organ or systems involvement: Secondary | ICD-10-CM | POA: Diagnosis not present

## 2022-08-27 LAB — LIPID PANEL
Chol/HDL Ratio: 2.9 ratio (ref 0.0–5.0)
Cholesterol, Total: 170 mg/dL (ref 100–199)
HDL: 59 mg/dL (ref >39)
LDL Chol Calc (NIH): 78 mg/dL (ref 0–99)
Triglycerides: 200 mg/dL — ABNORMAL HIGH (ref 0–149)
VLDL Cholesterol Cal: 33 mg/dL (ref 5–40)

## 2022-08-27 LAB — BASIC METABOLIC PANEL (7)
BUN/Creatinine Ratio: 18 (ref 10–24)
BUN: 18 mg/dL (ref 8–27)
CO2: 25 mmol/L (ref 20–29)
Chloride: 101 mmol/L (ref 96–106)
Creatinine, Ser: 1 mg/dL (ref 0.76–1.27)
Glucose: 117 mg/dL — ABNORMAL HIGH (ref 70–99)
Potassium: 4.2 mmol/L (ref 3.5–5.2)
Sodium: 139 mmol/L (ref 134–144)
eGFR: 75 mL/min/{1.73_m2} (ref >59)

## 2022-08-27 LAB — HEPATIC FUNCTION PANEL
ALT: 11 IU/L (ref 0–44)
AST: 21 IU/L (ref 0–40)
Albumin: 4.4 g/dL (ref 3.7–4.7)
Alkaline Phosphatase: 82 IU/L (ref 44–121)
Bilirubin Total: 1 mg/dL (ref 0.0–1.2)
Bilirubin, Direct: 0.23 mg/dL (ref 0.00–0.40)
Total Protein: 6.8 g/dL (ref 6.0–8.5)

## 2022-09-03 ENCOUNTER — Other Ambulatory Visit: Payer: Self-pay | Admitting: Rheumatology

## 2022-09-03 DIAGNOSIS — Z79899 Other long term (current) drug therapy: Secondary | ICD-10-CM

## 2022-09-03 MED ORDER — METHOTREXATE SODIUM 2.5 MG PO TABS: 15.0000 mg | ORAL_TABLET | ORAL | 0 refills | Status: DC

## 2022-09-03 NOTE — Telephone Encounter (Signed)
Patient called the office requesting a refill of Methotrexate to be sent to [facility]. Patient states he only has 4 tablets left. Patient would also like a call back advising him of when his labs are due.

## 2022-09-03 NOTE — Telephone Encounter (Signed)
Next Visit: 12/02/2022  Last Visit: 06/24/2022  Last Fill: 06/02/2022  DX: Rheumatoid arthritis involving multiple sites with positive rheumatoid factor   Current Dose per office note 06/24/2022: Methotrexate 2.5 mg 6 tablets by mouth every 7 days   Labs: 08/26/2022 Glucose 117 05/29/2022 Anemia has improved slightly.   Patient advised to update his CBC. Patient verified he is taking 6 tabs weekly of MTX. Patient will update this week.   Okay to refill MTX?

## 2022-09-04 ENCOUNTER — Other Ambulatory Visit: Payer: Self-pay | Admitting: *Deleted

## 2022-09-04 DIAGNOSIS — Z79899 Other long term (current) drug therapy: Secondary | ICD-10-CM | POA: Diagnosis not present

## 2022-09-04 LAB — CBC WITH DIFFERENTIAL/PLATELET
Absolute Monocytes: 503 cells/uL (ref 200–950)
Basophils Absolute: 23 cells/uL (ref 0–200)
Basophils Relative: 0.3 %
Eosinophils Absolute: 158 cells/uL (ref 15–500)
Eosinophils Relative: 2.1 %
HCT: 32.3 % — ABNORMAL LOW (ref 38.5–50.0)
Hemoglobin: 10.8 g/dL — ABNORMAL LOW (ref 13.2–17.1)
Lymphs Abs: 3000 cells/uL (ref 850–3900)
MCH: 32.5 pg (ref 27.0–33.0)
MCHC: 33.4 g/dL (ref 32.0–36.0)
MCV: 97.3 fL (ref 80.0–100.0)
MPV: 10 fL (ref 7.5–12.5)
Monocytes Relative: 6.7 %
Neutro Abs: 3818 cells/uL (ref 1500–7800)
Neutrophils Relative %: 50.9 %
Platelets: 183 10*3/uL (ref 140–400)
RBC: 3.32 10*6/uL — ABNORMAL LOW (ref 4.20–5.80)
RDW: 13.3 % (ref 11.0–15.0)
Total Lymphocyte: 40 %
WBC: 7.5 10*3/uL (ref 3.8–10.8)

## 2022-09-05 ENCOUNTER — Encounter: Payer: Self-pay | Admitting: Family Medicine

## 2022-09-05 ENCOUNTER — Ambulatory Visit (INDEPENDENT_AMBULATORY_CARE_PROVIDER_SITE_OTHER): Payer: PPO | Admitting: Family Medicine

## 2022-09-05 VITALS — BP 127/69 | HR 56 | Temp 97.3°F | Ht 68.0 in | Wt 187.0 lb

## 2022-09-05 DIAGNOSIS — D539 Nutritional anemia, unspecified: Secondary | ICD-10-CM

## 2022-09-05 DIAGNOSIS — D692 Other nonthrombocytopenic purpura: Secondary | ICD-10-CM

## 2022-09-05 DIAGNOSIS — F325 Major depressive disorder, single episode, in full remission: Secondary | ICD-10-CM | POA: Diagnosis not present

## 2022-09-05 DIAGNOSIS — M0579 Rheumatoid arthritis with rheumatoid factor of multiple sites without organ or systems involvement: Secondary | ICD-10-CM

## 2022-09-05 DIAGNOSIS — D649 Anemia, unspecified: Secondary | ICD-10-CM | POA: Diagnosis not present

## 2022-09-05 NOTE — Progress Notes (Signed)
   Subjective:    Patient ID: Penni Homans Sr., male    DOB: 04/17/1941, 82 y.o.   MRN: [id number]  HPI  Follow up for lab results  Review of Systems     Objective:   Physical Exam  General-in no acute distress Eyes-no discharge Lungs-respiratory rate normal, CTA CV-no murmurs,RRR Extremities skin warm dry no edema Neuro grossly normal Behavior normal, alert Has significant arthritis  Has significant rheumatoid arthritis Also has senile purpura     Assessment & Plan:  His rheumatologist was concerned about significant drop in his hemoglobin similar to previous indices pointing toward elevated MCV but some of them were lower 1. Macrocytic anemia We will go ahead with lab work to look at this closer - Iron, TIBC and Ferritin Panel - CBC with Differential - IFOBT POC (occult bld, rslt in office); Future  2. Normochromic anemia Ordered lab work to look at this closer along with stool testing to make sure that he is not losing blood - Iron, TIBC and Ferritin Panel - CBC with Differential This drop in hemoglobin is a significant change needs further workup [name] stable Depression in remission continue antidepressants Under a lot of stress financially Rheumatoid arthritis followed by specialist stable

## 2022-09-05 NOTE — Progress Notes (Signed)
Hemoglobin is low.  Please advise multivitamin with iron.  Please have patient contact his PCP.  Please forward results to his PCP.

## 2022-09-08 ENCOUNTER — Telehealth: Payer: Self-pay

## 2022-09-08 ENCOUNTER — Other Ambulatory Visit (INDEPENDENT_AMBULATORY_CARE_PROVIDER_SITE_OTHER): Payer: PPO

## 2022-09-08 DIAGNOSIS — D539 Nutritional anemia, unspecified: Secondary | ICD-10-CM | POA: Diagnosis not present

## 2022-09-08 DIAGNOSIS — K921 Melena: Secondary | ICD-10-CM

## 2022-09-08 DIAGNOSIS — D649 Anemia, unspecified: Secondary | ICD-10-CM | POA: Diagnosis not present

## 2022-09-08 LAB — IFOBT (OCCULT BLOOD): IFOBT: POSITIVE

## 2022-09-08 NOTE — Telephone Encounter (Signed)
Type of form received:Stool lab left   Additional comments:   Received by:Aphrodite Harpenau   Form should be Faxed/mailed to: (address/ fax #)No Form   Is patient requesting call for pickup:No Pickup   Form placed:  stool sample placed at nurses station

## 2022-09-09 ENCOUNTER — Encounter: Payer: Self-pay | Admitting: Internal Medicine

## 2022-09-09 LAB — IRON,TIBC AND FERRITIN PANEL
Ferritin: 72 ng/mL (ref 30–400)
Iron Saturation: 19 % (ref 15–55)
Iron: 55 ug/dL (ref 38–169)
Total Iron Binding Capacity: 285 ug/dL (ref 250–450)
UIBC: 230 ug/dL (ref 111–343)

## 2022-09-09 LAB — CBC WITH DIFFERENTIAL/PLATELET
Basophils Absolute: 0 10*3/uL (ref 0.0–0.2)
Basos: 0 %
EOS (ABSOLUTE): 0.2 10*3/uL (ref 0.0–0.4)
Eos: 3 %
Hematocrit: 32.4 % — ABNORMAL LOW (ref 37.5–51.0)
Hemoglobin: 11.3 g/dL — ABNORMAL LOW (ref 13.0–17.7)
Immature Grans (Abs): 0 10*3/uL (ref 0.0–0.1)
Immature Granulocytes: 0 %
Lymphocytes Absolute: 3.1 10*3/uL (ref 0.7–3.1)
Lymphs: 51 %
MCH: 33.9 pg — ABNORMAL HIGH (ref 26.6–33.0)
MCHC: 34.9 g/dL (ref 31.5–35.7)
MCV: 97 fL (ref 79–97)
Monocytes Absolute: 0.4 10*3/uL (ref 0.1–0.9)
Monocytes: 7 %
Neutrophils Absolute: 2.4 10*3/uL (ref 1.4–7.0)
Neutrophils: 39 %
Platelets: 190 10*3/uL (ref 150–450)
RBC: 3.33 x10E6/uL — ABNORMAL LOW (ref 4.14–5.80)
RDW: 13.7 % (ref 11.6–15.4)
WBC: 6 10*3/uL (ref 3.4–10.8)

## 2022-09-21 ENCOUNTER — Other Ambulatory Visit: Payer: Self-pay | Admitting: Family Medicine

## 2022-09-29 ENCOUNTER — Telehealth: Payer: Self-pay | Admitting: *Deleted

## 2022-09-29 NOTE — Telephone Encounter (Signed)
Patient requesting a refill of Tylenol with Codeine sent to [facility]

## 2022-09-30 ENCOUNTER — Other Ambulatory Visit: Payer: Self-pay | Admitting: Family Medicine

## 2022-09-30 MED ORDER — ACETAMINOPHEN-CODEINE 300-30 MG PO TABS: ORAL_TABLET | ORAL | 0 refills | Status: DC

## 2022-09-30 NOTE — Telephone Encounter (Signed)
Pt came into office and was made aware

## 2022-09-30 NOTE — Telephone Encounter (Signed)
Prescription was sent in as requested keep all regular follow-up visits

## 2022-09-30 NOTE — Telephone Encounter (Signed)
Tried to leave message for patient "voice mailbox is full" phone call disconnected.

## 2022-10-16 ENCOUNTER — Ambulatory Visit: Payer: PPO | Admitting: Internal Medicine

## 2022-10-20 ENCOUNTER — Encounter: Payer: Self-pay | Admitting: Internal Medicine

## 2022-11-12 ENCOUNTER — Encounter: Payer: Self-pay | Admitting: Internal Medicine

## 2022-11-12 ENCOUNTER — Ambulatory Visit (INDEPENDENT_AMBULATORY_CARE_PROVIDER_SITE_OTHER): Payer: PPO | Admitting: Internal Medicine

## 2022-11-12 VITALS — BP 124/68 | HR 76 | Temp 97.8°F | Ht 68.0 in | Wt 186.0 lb

## 2022-11-12 DIAGNOSIS — D649 Anemia, unspecified: Secondary | ICD-10-CM | POA: Diagnosis not present

## 2022-11-12 DIAGNOSIS — R1312 Dysphagia, oropharyngeal phase: Secondary | ICD-10-CM | POA: Diagnosis not present

## 2022-11-12 DIAGNOSIS — K219 Gastro-esophageal reflux disease without esophagitis: Secondary | ICD-10-CM | POA: Diagnosis not present

## 2022-11-12 DIAGNOSIS — R195 Other fecal abnormalities: Secondary | ICD-10-CM | POA: Diagnosis not present

## 2022-11-12 DIAGNOSIS — Z800 Family history of malignant neoplasm of digestive organs: Secondary | ICD-10-CM | POA: Diagnosis not present

## 2022-11-12 MED ORDER — OMEPRAZOLE 40 MG PO CPDR: 40.0000 mg | DELAYED_RELEASE_CAPSULE | Freq: Every day | ORAL | 11 refills | Status: DC

## 2022-11-12 MED ORDER — PEG 3350-KCL-NA BICARB-NACL 420 G PO SOLR: 4000.0000 mL | Freq: Once | ORAL | 0 refills | Status: AC

## 2022-11-12 NOTE — Progress Notes (Signed)
Primary Care Physician:  Kathyrn Drown, MD Primary Gastroenterologist:  Dr. Abbey Chatters  Chief Complaint  Patient presents with   Anemia    Referred for anemia and blood in stool. Patient has not seen any blood in stool. Had a positive IFBOT test. Takes ferrous sulfate 325 mg every other day.     HPI:   Bryan VORHIES Sr. is a 82 y.o. male who presents to clinic today by referral from his PCP Dr. Wolfgang Phoenix for evaluation.  History of chronic anemia, baseline hemoglobin 10-12.  Takes iron every other day.  Recent heme positive stool.  Also notes family history of colon cancer in his sister (age 95) and father (age 71).  Denies any gross melena or hematochezia.  States he had a colonoscopy over 20 years ago.  Believes he had polyps removed.  Also with chronic acid reflux symptoms mild to moderate, intermittent.  Improved on omeprazole 20 mg daily though having breakthrough symptoms of acid reflux.  Also notes oral pharyngeal dysphagia which is progressively worsened over the last 6 months.  Primarily notes issues with pills getting stuck in the back of his throat.   Past Medical History:  Diagnosis Date   Anemia    Basal cell carcinoma    BPH (benign prostatic hyperplasia)    GERD (gastroesophageal reflux disease)    Melanoma (HCC)    Osteoarthritis    Rheumatoid aortitis    Rheumatoid arthritis [facility])     Past Surgical History:  Procedure Laterality Date   CATARACT EXTRACTION W/PHACO Right 03/12/2020   Procedure: CATARACT EXTRACTION PHACO AND INTRAOCULAR LENS PLACEMENT RIGHT EYE;  Surgeon: Baruch Goldmann, MD;  Location: [facility];  Service: Ophthalmology;  Laterality: Right;  CDE: 9.99   CATARACT EXTRACTION W/PHACO Left 03/26/2020   Procedure: CATARACT EXTRACTION PHACO AND INTRAOCULAR LENS PLACEMENT (IOC);  Surgeon: Baruch Goldmann, MD;  Location: [facility];  Service: Ophthalmology;  Laterality: Left;  CDE: 9.16    COLONOSCOPY     HERNIA REPAIR     JOINT REPLACEMENT Bilateral    KNEE  ARTHROPLASTY     MELANOMA EXCISION     MELANOMA EXCISION  09/22/2018   on head    Current Outpatient Medications  Medication Sig Dispense Refill   acetaminophen (TYLENOL) 650 MG CR tablet Take 1,300 mg by mouth every 8 (eight) hours as needed for pain.     acetaminophen-codeine (TYLENOL #3) 300-30 MG tablet 1 taken 4 times daily as needed severe pain caution drowsiness not for frequent use 30 tablet 0   aspirin 81 MG EC tablet Take 81 mg by mouth daily.     atorvastatin (LIPITOR) 10 MG tablet TAKE 1 TABLET(10 MG) BY MOUTH DAILY 90 tablet 1   b complex vitamins tablet Take 1 tablet by mouth daily.     beta carotene w/minerals (OCUVITE) tablet Take 1 tablet by mouth daily.     Calcium-Magnesium-Zinc 167-83-8 MG TABS Take 3 tablets by mouth daily.     cetirizine (ZYRTEC) 10 MG tablet Take 10 mg by mouth daily.      cholecalciferol (VITAMIN D3) 25 MCG (1000 UNIT) tablet Take 1,000 Units by mouth daily.     Ferrous Sulfate (IRON) 325 (65 FE) MG TABS Take 325 mg by mouth every other day.     fluorometholone (FML) 0.1 % ophthalmic suspension Place 1 drop into both eyes 3 (three) times daily.      FLUoxetine (PROZAC) 20 MG capsule Take 1 capsule (20 mg total) by mouth  daily. 30 capsule 5   folic acid (FOLVITE) 1 MG tablet Take 2 tablets (2 mg total) by mouth daily. 180 tablet 3   furosemide (LASIX) 20 MG tablet TAKE 1 TABLET(20 MG) BY MOUTH EVERY MORNING AS NEEDED FOR FLUID IN LEGS 90 tablet 1   methotrexate (RHEUMATREX) 2.5 MG tablet Take 6 tablets (15 mg total) by mouth once a week. Caution:Chemotherapy. Protect from light. 72 tablet 0   mirtazapine (REMERON) 7.5 MG tablet Take 7.5 mg by mouth at bedtime.     Multiple Vitamins-Minerals (CENTRUM SILVER PO) Take by mouth.     Omega-3 Fatty Acids (SALMON OIL PO) Take 2,000 mg by mouth 2 (two) times daily.     omeprazole (PRILOSEC) 20 MG capsule TAKE 1 CAPSULE(20 MG) BY MOUTH DAILY 90 capsule 0   ondansetron (ZOFRAN) 8 MG tablet Take by mouth every  8 (eight) hours as needed for nausea or vomiting.     OVER THE COUNTER MEDICATION Dr. Felicie Morn foot cream     potassium chloride SA (KLOR-CON M) 20 MEQ tablet 1 qam when using lasix 30 tablet 5   tamsulosin (FLOMAX) 0.4 MG CAPS capsule TAKE 2 CAPSULES BY MOUTH EVERY EVENING 60 capsule 5   vitamin C (ASCORBIC ACID) 500 MG tablet Take 500 mg by mouth daily.     Zinc 50 MG TABS Take 50 mg by mouth daily.      No current facility-administered medications for this visit.    Allergies as of 11/12/2022 - Review Complete 11/12/2022  Allergen Reaction Noted   Morphine and related Other (See Comments) 01/04/2013   Acyclovir and related  08/27/2017    Family History  Problem Relation Age of Onset   Rheum arthritis Mother    Emphysema Father    Cancer Brother    Emphysema Sister     Social History   Socioeconomic History   Marital status: Married    Spouse name: Not on file   Number of children: Not on file   Years of education: Not on file   Highest education level: Not on file  Occupational History   Not on file  Tobacco Use   Smoking status: Never    Passive exposure: Current   Smokeless tobacco: Never  Vaping Use   Vaping Use: Never used  Substance and Sexual Activity   Alcohol use: No   Drug use: No   Sexual activity: Not on file  Other Topics Concern   Not on file  Social History Narrative   Not on file   Social Determinants of Health   Financial Resource Strain: Low Risk  (01/08/2021)   Overall Financial Resource Strain (CARDIA)    Difficulty of Paying Living Expenses: Not hard at all  Food Insecurity: No Food Insecurity (01/08/2021)   Hunger Vital Sign    Worried About Running Out of Food in the Last Year: Never true    [place] in the Last Year: Never true  Transportation Needs: No Transportation Needs (01/08/2021)   PRAPARE - Hydrologist (Medical): No    Lack of Transportation (Non-Medical): No  Physical Activity: Inactive  (01/08/2021)   Exercise Vital Sign    Days of Exercise per Week: 0 days    Minutes of Exercise per Session: 0 min  Stress: No Stress Concern Present (01/08/2021)   [place]    Feeling of Stress : Not at all  Social Connections: Moderately Integrated (  01/08/2021)   Social Connection and Isolation Panel [NHANES]    Frequency of Communication with Friends and Family: Not on file    Frequency of Social Gatherings with Friends and Family: More than three times a week    Attends Religious Services: More than 4 times per year    Active Member of [employer] or Organizations: No    Attends Archivist Meetings: Never    Marital Status: Married  Human resources officer Violence: Not At Risk (01/08/2021)   Humiliation, Afraid, Rape, and Kick questionnaire    Fear of Current or Ex-Partner: No    Emotionally Abused: No    Physically Abused: No    Sexually Abused: No    Subjective: Review of Systems  Constitutional:  Negative for chills and fever.  HENT:  Negative for congestion and hearing loss.   Eyes:  Negative for blurred vision and double vision.  Respiratory:  Negative for cough and shortness of breath.   Cardiovascular:  Negative for chest pain and palpitations.  Gastrointestinal:  Negative for abdominal pain, blood in stool, constipation, diarrhea, heartburn, melena and vomiting.       Oropharyngeal dysphagia  Genitourinary:  Negative for dysuria and urgency.  Musculoskeletal:  Negative for joint pain and myalgias.  Skin:  Negative for itching and rash.  Neurological:  Negative for dizziness and headaches.  Psychiatric/Behavioral:  Negative for depression. The patient is not nervous/anxious.        Objective: BP 124/68 (BP Location: Left Arm, Patient Position: Sitting, Cuff Size: Normal)   Pulse 76   Temp 97.8 F (36.6 C) (Oral)   Ht '5\' 8"'$  (1.727 m)   Wt 186 lb (84.4 kg)   BMI 28.28 kg/m  Physical  Exam Constitutional:      Appearance: Normal appearance.  HENT:     Head: Normocephalic and atraumatic.  Eyes:     Extraocular Movements: Extraocular movements intact.     Conjunctiva/sclera: Conjunctivae normal.  Cardiovascular:     Rate and Rhythm: Normal rate and regular rhythm.  Pulmonary:     Effort: Pulmonary effort is normal.     Breath sounds: Normal breath sounds.  Abdominal:     General: Bowel sounds are normal.     Palpations: Abdomen is soft.  Musculoskeletal:        General: Normal range of motion.     Cervical back: Normal range of motion and neck supple.  Skin:    General: Skin is warm.  Neurological:     General: No focal deficit present.     Mental Status: He is alert and oriented to person, place, and time.  Psychiatric:        Mood and Affect: Mood normal.        Behavior: Behavior normal.      Assessment: *Normocytic anemia  *Heme positive stool *Family history of colon cancer in father (50) and sister (64) *Chronic GERD-well controlled on Omeprazole 20 mg daily *Oropharyngeal dysphagia  Plan: Given patient's chronic anemia, heme positive stool, significant family history of colon cancer, will schedule for colonoscopy to further evaluate. The risks including infection, bleed, or perforation as well as benefits, limitations, alternatives and imponderables have been reviewed with the patient. Questions have been answered. All parties agreeable.  In regards to patient's chronic reflux, I will increase his omeprazole to 40 mg daily.  Will order MBSS to further evaluate oropharyngeal dysphagia.  Appreciate speech therapy's help.  Thank you Dr. Wolfgang Phoenix for the kind referral.  11/12/2022 11:48 AM  Disclaimer: This note was dictated with voice recognition software. Similar sounding words can inadvertently be transcribed and may not be corrected upon review.

## 2022-11-12 NOTE — Patient Instructions (Signed)
We will schedule you for colonoscopy to further evaluate your anemia, heme positive stool, family history of colon cancer.  I am going to increase your omeprazole to 40 mg daily.  I will order a swallow study to further evaluate your difficulty swallowing.  Further recommendations to follow.  Continue on oral iron.  It was very nice meeting you today.  Dr. Abbey Chatters

## 2022-11-12 NOTE — Addendum Note (Signed)
Addended by: Vicente Males on: 11/12/2022 01:55 PM   Modules accepted: Orders

## 2022-11-12 NOTE — Addendum Note (Signed)
Addended by: Cheron Every on: 11/12/2022 12:06 PM   Modules accepted: Orders

## 2022-11-18 ENCOUNTER — Telehealth: Payer: Self-pay

## 2022-11-18 NOTE — Telephone Encounter (Signed)
Pt come by said he has medicine at the pharmacy called in for colonoscopy to [facility] but when I check referral I see nothing can someone check on this and why was he called medication into the pharmacy   Cornelia Copa [phone]

## 2022-11-19 NOTE — Progress Notes (Signed)
Office Visit Note  Patient: Bryan Kibbey Hodges Sr.             Date of Birth: 06/25/1941           MRN: [id number]             PCP: Kathyrn Drown, MD Referring: Kathyrn Drown, MD Visit Date: 12/02/2022 Occupation: @GUAROCC @  Subjective:  Medication management  History of Present Illness: Bryan FINTEL Sr. is a 82 y.o. male history of seropositive rheumatoid arthritis.  He states his rheumatoid arthritis is controlled on methotrexate 6 tablets p.o. weekly along with folic acid.  He denies any increased joint pain or joint swelling.  He states he has been under a lot of stress as his wife developed COVID-19 virus infection and complications.  She was in intensive care unit.  She will be going to a nursing home after she gets discharged.   Activities of Daily Living:  Patient reports morning stiffness for 20 minutes.   Patient Reports nocturnal pain.  Difficulty dressing/grooming: Reports Difficulty climbing stairs: Reports Difficulty getting out of chair: Reports Difficulty using hands for taps, buttons, cutlery, and/or writing: Reports  Review of Systems  Constitutional:  Positive for fatigue.  HENT:  Positive for mouth dryness. Negative for mouth sores.   Eyes:  Positive for dryness.  Respiratory:  Negative for shortness of breath.   Cardiovascular:  Negative for chest pain and palpitations.  Gastrointestinal:  Negative for blood in stool, constipation and diarrhea.  Endocrine: Negative for increased urination.  Genitourinary:  Positive for involuntary urination.  Musculoskeletal:  Positive for joint pain, gait problem, joint pain, joint swelling, myalgias, morning stiffness and myalgias. Negative for muscle weakness and muscle tenderness.  Skin:  Positive for sensitivity to sunlight. Negative for color change, rash and hair loss.  Allergic/Immunologic: Negative for susceptible to infections.  Neurological:  Negative for dizziness and headaches.  Hematological:  Positive for  swollen glands.  Psychiatric/Behavioral:  Positive for depressed mood. Negative for sleep disturbance. The patient is nervous/anxious.     PMFS History:  Patient Active Problem List   Diagnosis Date Noted   [name] 01/29/2021   Anemia, chronic disease 06/25/2020   High risk medication use 09/19/2016   History of total knee replacement, bilateral 09/19/2016   Gastroesophageal reflux disease without esophagitis 09/19/2016   History of melanoma 09/19/2016   History of basal cell carcinoma 09/19/2016   History of anemia 09/19/2016   Allergic rhinitis 02/18/2016   Prediabetes 11/20/2014   Hyperlipidemia 11/20/2014   BPH (benign prostatic hyperplasia) 05/22/2014   Rheumatoid arthritis involving multiple sites with positive rheumatoid factor 01/05/2013   Chronic pain syndrome 01/05/2013    Past Medical History:  Diagnosis Date   Anemia    Basal cell carcinoma    BPH (benign prostatic hyperplasia)    GERD (gastroesophageal reflux disease)    Melanoma    Osteoarthritis    Rheumatoid aortitis    Rheumatoid arthritis     Family History  Problem Relation Age of Onset   Rheum arthritis Mother    Emphysema Father    Cancer Brother    Emphysema Sister    Past Surgical History:  Procedure Laterality Date   CATARACT EXTRACTION W/PHACO Right 03/12/2020   Procedure: CATARACT EXTRACTION PHACO AND INTRAOCULAR LENS PLACEMENT RIGHT EYE;  Surgeon: Baruch Goldmann, MD;  Location: [facility];  Service: Ophthalmology;  Laterality: Right;  CDE: 9.99   CATARACT EXTRACTION W/PHACO Left 03/26/2020   Procedure: CATARACT EXTRACTION  PHACO AND INTRAOCULAR LENS PLACEMENT (IOC);  Surgeon: Baruch Goldmann, MD;  Location: [facility];  Service: Ophthalmology;  Laterality: Left;  CDE: 9.16    COLONOSCOPY     HERNIA REPAIR     JOINT REPLACEMENT Bilateral    KNEE ARTHROPLASTY     MELANOMA EXCISION     MELANOMA EXCISION  09/22/2018   on head   Social History   Social History Narrative   Not on file    Immunization History  Administered Date(s) Administered   Fluad Quad(high Dose 65+) 06/25/2020, 06/06/2021, 06/30/2022   Influenza Split 05/19/2013   Influenza, High Dose Seasonal PF 05/20/2019   Influenza,inj,Quad PF,6+ Mos 05/22/2014, 05/22/2015, 06/06/2016, 06/10/2017, 05/14/2018   Influenza-Unspecified 05/20/2019   PFIZER(Purple Top)SARS-COV-2 Vaccination 11/14/2019, 12/05/2019   Pneumococcal Conjugate-13 05/22/2014   Pneumococcal Polysaccharide-23 05/19/2013   Zoster Recombinat (Shingrix) 09/06/2018, 03/11/2019     Objective: Vital Signs: BP 105/61 (BP Location: Left Arm, Patient Position: Sitting, Cuff Size: Normal)   Pulse 73   Resp 17   Ht 5\' 8"  (1.727 m)   Wt 186 lb (84.4 kg)   BMI 28.28 kg/m    Physical Exam Vitals and nursing note reviewed.  Constitutional:      Appearance: He is well-developed.  HENT:     Head: Normocephalic and atraumatic.  Eyes:     Conjunctiva/sclera: Conjunctivae normal.     Pupils: Pupils are equal, round, and reactive to light.  Cardiovascular:     Rate and Rhythm: Normal rate and regular rhythm.     Heart sounds: Normal heart sounds.  Pulmonary:     Effort: Pulmonary effort is normal.     Breath sounds: Normal breath sounds.  Abdominal:     General: Bowel sounds are normal.     Palpations: Abdomen is soft.  Musculoskeletal:     Cervical back: Normal range of motion and neck supple.  Skin:    General: Skin is warm and dry.     Capillary Refill: Capillary refill takes less than 2 seconds.  Neurological:     Mental Status: He is alert and oriented to person, place, and time.  Psychiatric:        Behavior: Behavior normal.      Musculoskeletal Exam: He had limited range of motion of the cervical spine.  Thoracic kyphosis was noted.  He had no tenderness over thoracic or lumbar spine.  Shoulder joint abduction was limited to 90 degrees.  He had limited internal rotation.  Elbow contractures were unchanged without any synovitis.   He had limited range of motion of bilateral wrist joints.  He had bilateral MCP thickening.  Swan-neck deformities were noted in the PIPs.  Hip joints were in good range of motion.  He had bilateral knee joint replacement with limited extension.  Synovial thickening was noted over ankles.  There was no tenderness over MTPs.  CDAI Exam: CDAI Score: -- Patient Global: 4 mm; Provider Global: 4 mm Swollen: --; Tender: -- Joint Exam 12/02/2022   No joint exam has been documented for this visit   There is currently no information documented on the homunculus. Go to the Rheumatology activity and complete the homunculus joint exam.  Investigation: No additional findings.  Imaging: No results found.  Recent Labs: Lab Results  Component Value Date   WBC 7.0 11/24/2022   HGB 11.6 (L) 11/24/2022   PLT 181 11/24/2022   NA 140 11/24/2022   K 4.3 11/24/2022   CL 101 11/24/2022   CO2 31 11/24/2022  GLUCOSE 98 11/24/2022   BUN 20 11/24/2022   CREATININE 0.93 11/24/2022   BILITOT 0.8 11/24/2022   ALKPHOS 82 08/26/2022   AST 24 11/24/2022   ALT 17 11/24/2022   PROT 6.8 11/24/2022   ALBUMIN 4.4 08/26/2022   CALCIUM 9.1 11/24/2022   GFRAA 96 01/14/2021    Speciality Comments: No specialty comments available.  Procedures:  No procedures performed Allergies: Morphine and related and Acyclovir and related   Assessment / Plan:     Visit Diagnoses: Rheumatoid arthritis involving multiple sites with positive rheumatoid factor - Positive rheumatoid factor, positive anti-CCP, severe erosive disease with nodulosis and contractures: Patient denies having a flare of rheumatoid arthritis.  He has multiple contractures but no synovitis.  He had limited range of motion of his bilateral shoulders.  Contractures were noted in the elbow joints without any synovitis.  His wrist joints are fused.  He had swan-neck deformities in most of his fingers.  No active synovitis was noted.  Synovial thickening was  noted.  High risk medication use - Methotrexate 2.5 mg 6 tablets by mouth every 7 days and folic acid 1 mg 2 tablets daily.  Labs obtained on November 24, 2022 CBC and CMP were stable.  He was advised to get repeat labs in June and every 3 months.  Information on immunization was placed in the AVS.  He was advised to stop methotrexate if he develops an infection resume after the infection resolves.  History of total knee replacement, bilateral-no warmth swelling or effusion was noted.  He had limited extension.  Pain in both feet -there was no swelling over ankles.  There was no tenderness over MTPs.  X-rays of both feet from 05/07/2018 were consistent with severe erosive rheumatoid arthritis.  He has overcrowding and subluxation of all MTP joints  Chronic pain syndrome - Patient is no longer taking Tylenol #3.  He has been taking Tylenol 650 mg 2 tablets daily for pain relief.  Other medical problems are listed as follows:  Stress-patient has been under a lot of stress and his wife is in intensive care unit.  Patient states that his wife developed COVID-19 virus infection and complications from the infection.  Prediabetes  History of hyperlipidemia  History of melanoma-he is followed by dermatologist on a regular basis.  History of anemia  History of gastroesophageal reflux (GERD)  History of basal cell carcinoma-he had several lesions treated recently.  Orders: No orders of the defined types were placed in this encounter.  No orders of the defined types were placed in this encounter.   Follow-Up Instructions: Return in about 5 months (around 05/04/2023) for Rheumatoid arthritis.   Bo Merino, MD  Note - This record has been created using Editor, commissioning.  Chart creation errors have been sought, but may not always  have been located. Such creation errors do not reflect on  the standard of medical care.

## 2022-11-19 NOTE — Telephone Encounter (Signed)
Patient is scheduled for colonoscopy 12/22/22   Patient notified

## 2022-11-20 ENCOUNTER — Telehealth (INDEPENDENT_AMBULATORY_CARE_PROVIDER_SITE_OTHER): Payer: Self-pay | Admitting: Gastroenterology

## 2022-11-20 NOTE — Telephone Encounter (Signed)
Pt contacted and made aware of pre op appt on 12/18/22 at 11:00 [facility]. Will also mail reminder to pt.

## 2022-11-24 ENCOUNTER — Other Ambulatory Visit: Payer: Self-pay | Admitting: *Deleted

## 2022-11-24 ENCOUNTER — Telehealth: Payer: Self-pay | Admitting: Rheumatology

## 2022-11-24 DIAGNOSIS — Z79899 Other long term (current) drug therapy: Secondary | ICD-10-CM | POA: Diagnosis not present

## 2022-11-24 MED ORDER — METHOTREXATE SODIUM 2.5 MG PO TABS: 15.0000 mg | ORAL_TABLET | ORAL | 0 refills | Status: DC

## 2022-11-24 NOTE — Telephone Encounter (Signed)
Last Fill: 09/03/2022  Labs: 09/08/2022 and pending today, RBC 3.33, Hemoglobin 11.3, hematocrit 32.4, MCH 33.9, 08/26/2022 BMP Glucose 117, Bilirubin Total 1.4, Triglycerides 200  Next Visit: 12/02/2022  Last Visit: 06/24/2022   DX: Rheumatoid arthritis involving multiple sites with positive rheumatoid factor   Current Dose per office note 06/24/2022: Methotrexate 2.5 mg 6 tablets by mouth every 7 days a   Okay to refill Methotrexate?

## 2022-11-24 NOTE — Telephone Encounter (Signed)
Sent to [place] to [facility]

## 2022-11-24 NOTE — Telephone Encounter (Signed)
Patient came in the office for labwork and stated he only had 5 tablets left of Methotrexate.  Patient requested the refill be sent to [facility] in [place].

## 2022-11-25 LAB — COMPLETE METABOLIC PANEL WITH GFR
AG Ratio: 1.5 (calc) (ref 1.0–2.5)
ALT: 17 U/L (ref 9–46)
AST: 24 U/L (ref 10–35)
Albumin: 4.1 g/dL (ref 3.6–5.1)
Alkaline phosphatase (APISO): 73 U/L (ref 35–144)
BUN: 20 mg/dL (ref 7–25)
CO2: 31 mmol/L (ref 20–32)
Calcium: 9.1 mg/dL (ref 8.6–10.3)
Chloride: 101 mmol/L (ref 98–110)
Creat: 0.93 mg/dL (ref 0.70–1.22)
Globulin: 2.7 g/dL (calc) (ref 1.9–3.7)
Glucose, Bld: 98 mg/dL (ref 65–99)
Potassium: 4.3 mmol/L (ref 3.5–5.3)
Sodium: 140 mmol/L (ref 135–146)
Total Bilirubin: 0.8 mg/dL (ref 0.2–1.2)
Total Protein: 6.8 g/dL (ref 6.1–8.1)
eGFR: 82 mL/min/{1.73_m2} (ref >=60)

## 2022-11-25 LAB — CBC WITH DIFFERENTIAL/PLATELET
Absolute Monocytes: 539 cells/uL (ref 200–950)
Basophils Absolute: 28 cells/uL (ref 0–200)
Basophils Relative: 0.4 %
Eosinophils Absolute: 119 cells/uL (ref 15–500)
Eosinophils Relative: 1.7 %
HCT: 35 % — ABNORMAL LOW (ref 38.5–50.0)
Hemoglobin: 11.6 g/dL — ABNORMAL LOW (ref 13.2–17.1)
Lymphs Abs: 3318 cells/uL (ref 850–3900)
MCH: 32.7 pg (ref 27.0–33.0)
MCHC: 33.1 g/dL (ref 32.0–36.0)
MCV: 98.6 fL (ref 80.0–100.0)
MPV: 9.8 fL (ref 7.5–12.5)
Monocytes Relative: 7.7 %
Neutro Abs: 2996 cells/uL (ref 1500–7800)
Neutrophils Relative %: 42.8 %
Platelets: 181 10*3/uL (ref 140–400)
RBC: 3.55 10*6/uL — ABNORMAL LOW (ref 4.20–5.80)
RDW: 14.2 % (ref 11.0–15.0)
Total Lymphocyte: 47.4 %
WBC: 7 10*3/uL (ref 3.8–10.8)

## 2022-11-26 DIAGNOSIS — L570 Actinic keratosis: Secondary | ICD-10-CM | POA: Diagnosis not present

## 2022-11-26 DIAGNOSIS — Z08 Encounter for follow-up examination after completed treatment for malignant neoplasm: Secondary | ICD-10-CM | POA: Diagnosis not present

## 2022-11-26 DIAGNOSIS — D225 Melanocytic nevi of trunk: Secondary | ICD-10-CM | POA: Diagnosis not present

## 2022-11-26 DIAGNOSIS — Z1283 Encounter for screening for malignant neoplasm of skin: Secondary | ICD-10-CM | POA: Diagnosis not present

## 2022-11-26 DIAGNOSIS — Z85820 Personal history of malignant melanoma of skin: Secondary | ICD-10-CM | POA: Diagnosis not present

## 2022-11-26 DIAGNOSIS — X32XXXD Exposure to sunlight, subsequent encounter: Secondary | ICD-10-CM | POA: Diagnosis not present

## 2022-12-02 ENCOUNTER — Ambulatory Visit: Payer: PPO | Attending: Rheumatology | Admitting: Rheumatology

## 2022-12-02 ENCOUNTER — Encounter: Payer: Self-pay | Admitting: Rheumatology

## 2022-12-02 VITALS — BP 105/61 | HR 73 | Resp 17 | Ht 68.0 in | Wt 186.0 lb

## 2022-12-02 DIAGNOSIS — G894 Chronic pain syndrome: Secondary | ICD-10-CM | POA: Diagnosis not present

## 2022-12-02 DIAGNOSIS — R7303 Prediabetes: Secondary | ICD-10-CM | POA: Diagnosis not present

## 2022-12-02 DIAGNOSIS — M79671 Pain in right foot: Secondary | ICD-10-CM | POA: Diagnosis not present

## 2022-12-02 DIAGNOSIS — Z8719 Personal history of other diseases of the digestive system: Secondary | ICD-10-CM | POA: Diagnosis not present

## 2022-12-02 DIAGNOSIS — Z85828 Personal history of other malignant neoplasm of skin: Secondary | ICD-10-CM

## 2022-12-02 DIAGNOSIS — Z8639 Personal history of other endocrine, nutritional and metabolic disease: Secondary | ICD-10-CM | POA: Diagnosis not present

## 2022-12-02 DIAGNOSIS — F439 Reaction to severe stress, unspecified: Secondary | ICD-10-CM

## 2022-12-02 DIAGNOSIS — Z79899 Other long term (current) drug therapy: Secondary | ICD-10-CM

## 2022-12-02 DIAGNOSIS — Z96653 Presence of artificial knee joint, bilateral: Secondary | ICD-10-CM

## 2022-12-02 DIAGNOSIS — Z85820 Personal history of malignant melanoma of skin: Secondary | ICD-10-CM

## 2022-12-02 DIAGNOSIS — Z862 Personal history of diseases of the blood and blood-forming organs and certain disorders involving the immune mechanism: Secondary | ICD-10-CM | POA: Diagnosis not present

## 2022-12-02 DIAGNOSIS — M0579 Rheumatoid arthritis with rheumatoid factor of multiple sites without organ or systems involvement: Secondary | ICD-10-CM

## 2022-12-02 DIAGNOSIS — M79672 Pain in left foot: Secondary | ICD-10-CM

## 2022-12-02 NOTE — Patient Instructions (Signed)
Standing Labs We placed an order today for your standing lab work.   Please have your standing labs drawn in June and every 3 months  Please have your labs drawn 2 weeks prior to your appointment so that the provider can discuss your lab results at your appointment, if possible.  Please note that you may see your imaging and lab results in [facility] before we have reviewed them. We will contact you once all results are reviewed. Please allow our office up to 72 hours to thoroughly review all of the results before contacting the office for clarification of your results.  WALK-IN LAB HOURS  Monday through Thursday from 8:00 am -12:30 pm and 1:00 pm-5:00 pm and Friday from 8:00 am-12:00 pm.  Patients with office visits requiring labs will be seen before walk-in labs.  You may encounter longer than normal wait times. Please allow additional time. Wait times may be shorter on  Monday and Thursday afternoons.  We do not book appointments for walk-in labs. We appreciate your patience and understanding with our staff.   Labs are drawn by [facility]. Please bring your co-pay at the time of your lab draw.  You may receive a bill from [facility] for your lab work.  Please note if you are on Hydroxychloroquine and and an order has been placed for a Hydroxychloroquine level,  you will need to have it drawn 4 hours or more after your last dose.  If you wish to have your labs drawn at another location, please call the office 24 hours in advance so we can fax the orders.  The office is located at [address], [place], [place], [place] [address]   If you have any questions regarding directions or hours of operation,  please call [phone].   As a reminder, please drink plenty of water prior to coming for your lab work. Thanks!   If you have signs or symptoms of an infection or start antibiotics: First, call your PCP for workup of your infection. Hold your medication through the infection, until you  complete your antibiotics, and until symptoms resolve if you take the following: Injectable medication (Actemra, Benlysta, Cimzia, Cosentyx, Enbrel, Humira, Kevzara, Orencia, Remicade, Simponi, Stelara, Taltz, Tremfya) Methotrexate Leflunomide (Arava) Mycophenolate (Cellcept) Morrie Sheldon, Olumiant, or Rinvoq  If you have signs or symptoms of an infection or start antibiotics: First, call your PCP for workup of your infection. Hold your medication through the infection, until you complete your antibiotics, and until symptoms resolve if you take the following: Injectable medication (Actemra, Benlysta, Cimzia, Cosentyx, Enbrel, Humira, Kevzara, Orencia, Remicade, Simponi, Stelara, Taltz, Tremfya) Methotrexate Leflunomide (Arava) Mycophenolate (Cellcept) Morrie Sheldon, Olumiant, or Rinvoq

## 2022-12-11 ENCOUNTER — Other Ambulatory Visit (HOSPITAL_COMMUNITY): Payer: Self-pay | Admitting: Occupational Therapy

## 2022-12-11 DIAGNOSIS — K219 Gastro-esophageal reflux disease without esophagitis: Secondary | ICD-10-CM

## 2022-12-11 DIAGNOSIS — R1312 Dysphagia, oropharyngeal phase: Secondary | ICD-10-CM

## 2022-12-18 ENCOUNTER — Other Ambulatory Visit: Payer: Self-pay

## 2022-12-18 ENCOUNTER — Encounter (HOSPITAL_COMMUNITY)
Admission: RE | Admit: 2022-12-18 | Discharge: 2022-12-18 | Disposition: A | Discharge status: Home / Self Care | Payer: PPO | Source: Ambulatory Visit | Attending: Internal Medicine | Admitting: Internal Medicine

## 2022-12-18 ENCOUNTER — Encounter (HOSPITAL_COMMUNITY): Payer: Self-pay

## 2022-12-22 ENCOUNTER — Other Ambulatory Visit: Payer: Self-pay

## 2022-12-22 ENCOUNTER — Ambulatory Visit (HOSPITAL_COMMUNITY)
Admission: RE | Admit: 2022-12-22 | Discharge: 2022-12-22 | Disposition: A | Discharge status: Home / Self Care | Payer: PPO | Attending: Internal Medicine | Admitting: Internal Medicine

## 2022-12-22 ENCOUNTER — Encounter (HOSPITAL_COMMUNITY)
Admission: RE | Disposition: A | Discharge status: Home / Self Care | Payer: Self-pay | Source: Home / Self Care | Attending: Internal Medicine

## 2022-12-22 ENCOUNTER — Encounter (HOSPITAL_COMMUNITY): Payer: Self-pay

## 2022-12-22 ENCOUNTER — Ambulatory Visit (HOSPITAL_COMMUNITY): Payer: PPO | Admitting: Anesthesiology

## 2022-12-22 ENCOUNTER — Ambulatory Visit (HOSPITAL_BASED_OUTPATIENT_CLINIC_OR_DEPARTMENT_OTHER): Payer: PPO | Admitting: Anesthesiology

## 2022-12-22 DIAGNOSIS — D509 Iron deficiency anemia, unspecified: Secondary | ICD-10-CM | POA: Diagnosis not present

## 2022-12-22 DIAGNOSIS — D123 Benign neoplasm of transverse colon: Secondary | ICD-10-CM

## 2022-12-22 DIAGNOSIS — K219 Gastro-esophageal reflux disease without esophagitis: Secondary | ICD-10-CM | POA: Insufficient documentation

## 2022-12-22 DIAGNOSIS — R195 Other fecal abnormalities: Secondary | ICD-10-CM | POA: Diagnosis not present

## 2022-12-22 DIAGNOSIS — D122 Benign neoplasm of ascending colon: Secondary | ICD-10-CM

## 2022-12-22 DIAGNOSIS — D120 Benign neoplasm of cecum: Secondary | ICD-10-CM

## 2022-12-22 DIAGNOSIS — I739 Peripheral vascular disease, unspecified: Secondary | ICD-10-CM

## 2022-12-22 DIAGNOSIS — K5730 Diverticulosis of large intestine without perforation or abscess without bleeding: Secondary | ICD-10-CM

## 2022-12-22 DIAGNOSIS — D124 Benign neoplasm of descending colon: Secondary | ICD-10-CM | POA: Diagnosis not present

## 2022-12-22 DIAGNOSIS — M069 Rheumatoid arthritis, unspecified: Secondary | ICD-10-CM | POA: Insufficient documentation

## 2022-12-22 DIAGNOSIS — K648 Other hemorrhoids: Secondary | ICD-10-CM

## 2022-12-22 DIAGNOSIS — K635 Polyp of colon: Secondary | ICD-10-CM | POA: Diagnosis not present

## 2022-12-22 DIAGNOSIS — Z800 Family history of malignant neoplasm of digestive organs: Secondary | ICD-10-CM

## 2022-12-22 HISTORY — PX: POLYPECTOMY: SHX5525

## 2022-12-22 HISTORY — PX: COLONOSCOPY WITH PROPOFOL: SHX5780

## 2022-12-22 SURGERY — COLONOSCOPY WITH PROPOFOL
Anesthesia: General

## 2022-12-22 MED ORDER — LACTATED RINGERS IV SOLN: INTRAVENOUS | Status: DC

## 2022-12-22 MED ORDER — PROPOFOL 500 MG/50ML IV EMUL
INTRAVENOUS | Status: DC | PRN
  Administered 2022-12-22: 100 ug/kg/min via INTRAVENOUS

## 2022-12-22 MED ORDER — EPHEDRINE SULFATE (PRESSORS) 50 MG/ML IJ SOLN
INTRAMUSCULAR | Status: DC | PRN
  Administered 2022-12-22: 5 mg via INTRAVENOUS

## 2022-12-22 MED ORDER — PROPOFOL 10 MG/ML IV BOLUS
INTRAVENOUS | Status: DC | PRN
  Administered 2022-12-22: 50 mg via INTRAVENOUS
  Administered 2022-12-22: 15 mg via INTRAVENOUS

## 2022-12-22 NOTE — Anesthesia Procedure Notes (Signed)
Date/Time: 12/22/2022 9:41 AM  Performed by: Franco Nones, CRNAPre-anesthesia Checklist: Patient identified, Emergency Drugs available, Suction available, Timeout performed and Patient being monitored Patient Re-evaluated:Patient Re-evaluated prior to induction Oxygen Delivery Method: Nasal Cannula

## 2022-12-22 NOTE — Anesthesia Postprocedure Evaluation (Signed)
Anesthesia Post Note  Patient: Taejon Irani Scharfenberg Sr.  Procedure(s) Performed: COLONOSCOPY WITH PROPOFOL POLYPECTOMY  Patient location during evaluation: PACU Anesthesia Type: General Level of consciousness: awake and alert and oriented Pain management: pain level controlled Vital Signs Assessment: post-procedure vital signs reviewed and stable Respiratory status: spontaneous breathing, nonlabored ventilation and respiratory function stable Cardiovascular status: blood pressure returned to baseline and stable Postop Assessment: no apparent nausea or vomiting Anesthetic complications: no  No notable events documented.   Last Vitals:  Vitals:   12/22/22 1010 12/22/22 1014  BP: (!) 86/39 (!) 101/46  Pulse: (!) 51 60  Resp: (!) 22   Temp: (!) 36.1 C   SpO2: 98% 98%    Last Pain:  Vitals:   12/22/22 1014  TempSrc:   PainSc: 0-No pain                 Mozel Burdett C Gwendelyn Lanting

## 2022-12-22 NOTE — Anesthesia Preprocedure Evaluation (Signed)
Anesthesia Evaluation  Patient identified by MRN, date of birth, ID band Patient awake    Reviewed: Allergy & Precautions, H&P , NPO status , Patient's Chart, lab work & pertinent test results  Airway Mallampati: II  TM Distance: >3 FB Neck ROM: Full    Dental  (+) Upper Dentures, Lower Dentures   Pulmonary neg pulmonary ROS   Pulmonary exam normal breath sounds clear to auscultation       Cardiovascular Exercise Tolerance: Good + Peripheral Vascular Disease (Rheumatoid aortitis)  Normal cardiovascular exam Rhythm:Regular Rate:Normal     Neuro/Psych negative neurological ROS  negative psych ROS   GI/Hepatic Neg liver ROS,GERD  Medicated and Controlled,,  Endo/Other  negative endocrine ROS    Renal/GU negative Renal ROS  negative genitourinary   Musculoskeletal  (+) Arthritis , Rheumatoid disorders,    Abdominal   Peds negative pediatric ROS (+)  Hematology  (+) Blood dyscrasia, anemia   Anesthesia Other Findings   Reproductive/Obstetrics negative OB ROS                              Anesthesia Physical Anesthesia Plan  ASA: 3  Anesthesia Plan: General   Post-op Pain Management: Minimal or no pain anticipated   Induction: Intravenous  PONV Risk Score and Plan: 1 and Propofol infusion  Airway Management Planned: Nasal Cannula and Natural Airway  Additional Equipment:   Intra-op Plan:   Post-operative Plan:   Informed Consent: I have reviewed the patients History and Physical, chart, labs and discussed the procedure including the risks, benefits and alternatives for the proposed anesthesia with the patient or authorized representative who has indicated his/her understanding and acceptance.     Dental advisory given  Plan Discussed with: CRNA and Surgeon  Anesthesia Plan Comments:          Anesthesia Quick Evaluation

## 2022-12-22 NOTE — Transfer of Care (Signed)
Immediate Anesthesia Transfer of Care Note  Patient: Bryan Limbo Weldy Sr.  Procedure(s) Performed: COLONOSCOPY WITH PROPOFOL POLYPECTOMY  Patient Location: Short Stay  Anesthesia Type:General  Level of Consciousness: awake  Airway & Oxygen Therapy: Patient Spontanous Breathing  Post-op Assessment: Report given to RN and Post -op Vital signs reviewed and stable  Post vital signs: Reviewed and stable  Last Vitals:  Vitals Value Taken Time  BP 86/39 12/22/22 1010  Temp 36.1 C 12/22/22 1010  Pulse 51 12/22/22 1010  Resp 22 12/22/22 1010  SpO2 98 % 12/22/22 1010  101/46 B/P at 1015  Last Pain:  Vitals:   12/22/22 1010  TempSrc: Axillary  PainSc:       Patients Stated Pain Goal: 4 (12/22/22 0859)  Complications: No notable events documented.

## 2022-12-22 NOTE — Op Note (Addendum)
[facility] Patient Name: Janelle Culton Procedure Date: 12/22/2022 9:32 AM MRN: [id number] Date of Birth: 11/29/1940 Attending MD: Hennie Duos. Maple Mirza, [id number] CSN: [id number] Age: 82 Admit Type: Outpatient Procedure:                Colonoscopy Indications:              Heme positive stool, Iron deficiency anemia Providers:                Hennie Duos. Marletta Lor, DO, Jannett Celestine, RN, Angelica Ran,                            Pandora Leiter, Technician Referring MD:              Medicines:                See the Anesthesia note for documentation of the                            administered medications Complications:            No immediate complications. Estimated Blood Loss:     Estimated blood loss was minimal. Procedure:                Pre-Anesthesia Assessment:                           - The anesthesia plan was to use monitored                            anesthesia care (MAC).                           After obtaining informed consent, the colonoscope                            was passed under direct vision. Throughout the                            procedure, the patient's blood pressure, pulse, and                            oxygen saturations were monitored continuously. The                            [id number]-HQ190L ([id number]) scope was introduced through                            the anus and advanced to the the terminal ileum,                            with identification of the appendiceal orifice and                            IC valve. The colonoscopy was performed without                            difficulty.  The patient tolerated the procedure                            well. The quality of the bowel preparation was                            evaluated using the BBPS [facility] Bowel Preparation                            Scale) with scores of: Right Colon = 3, Transverse                            Colon = 3 and Left Colon = 3 (entire mucosa seen                             well with no residual staining, small fragments of                            stool or opaque liquid). The total BBPS score                            equals 9. Scope In: 9:46:52 AM Scope Out: 10:04:42 AM Scope Withdrawal Time: 0 hours 14 minutes 57 seconds  Total Procedure Duration: 0 hours 17 minutes 50 seconds  Findings:      Non-bleeding internal hemorrhoids were found during endoscopy.      Multiple large-mouthed and small-mouthed diverticula were found in the       sigmoid colon and descending colon.      Five sessile polyps were found in the ascending colon and cecum. The       polyps were 3 to 6 mm in size. These polyps were removed with a cold       snare. Resection and retrieval were complete. 1 polyp not retrieved.      A 4 mm polyp was found in the descending colon. The polyp was sessile.       The polyp was removed with a cold snare. Resection and retrieval were       complete.      The exam was otherwise without abnormality. Impression:               - Non-bleeding internal hemorrhoids.                           - Diverticulosis in the sigmoid colon and in the                            descending colon.                           - Five 3 to 6 mm polyps in the ascending colon and                            in the cecum, removed with a cold snare. Resected  and retrieved.                           - One 4 mm polyp in the descending colon, removed                            with a cold snare. Resected and retrieved.                           - The examination was otherwise normal. Moderate Sedation:      Per Anesthesia Care Recommendation:           - Patient has a contact number available for                            emergencies. The signs and symptoms of potential                            delayed complications were discussed with the                            patient. Return to normal activities tomorrow.                             Written discharge instructions were provided to the                            patient.                           - Resume previous diet.                           - Continue present medications.                           - Await pathology results.                           - No repeat colonoscopy due to age.                           - Return to GI clinic in 3 months. Procedure Code(s):        --- Professional ---                           [id number], Colonoscopy, flexible; with removal of                            tumor(s), polyp(s), or other lesion(s) by snare                            technique Diagnosis Code(s):        --- Professional ---                           K64.8, Other hemorrhoids  D12.2, Benign neoplasm of ascending colon                           D12.0, Benign neoplasm of cecum                           D12.3, Benign neoplasm of transverse colon (hepatic                            flexure or splenic flexure)                           R19.5, Other fecal abnormalities                           D50.9, Iron deficiency anemia, unspecified                           K57.30, Diverticulosis of large intestine without                            perforation or abscess without bleeding CPT copyright 2022 American Medical Association. All rights reserved. The codes documented in this report are preliminary and upon coder review may  be revised to meet current compliance requirements. Hennie Duos. Marletta Lor, DO Hennie Duos. Marletta Lor, DO 12/22/2022 10:09:26 AM This report has been signed electronically. Number of Addenda: 0

## 2022-12-22 NOTE — H&P (Signed)
Primary Care Physician:  Babs Sciara, MD Primary Gastroenterologist:  Dr. Marletta Lor  Pre-Procedure History & Physical: HPI:  Bryan Alderman Sr. is a 82 y.o. male is here for a colonoscopy to be performed for colon anemia and heme positive stools.   Past Medical History:  Diagnosis Date   Anemia    Basal cell carcinoma    BPH (benign prostatic hyperplasia)    GERD (gastroesophageal reflux disease)    Melanoma    Osteoarthritis    Rheumatoid aortitis    Rheumatoid arthritis     Past Surgical History:  Procedure Laterality Date   CATARACT EXTRACTION W/PHACO Right 03/12/2020   Procedure: CATARACT EXTRACTION PHACO AND INTRAOCULAR LENS PLACEMENT RIGHT EYE;  Surgeon: Fabio Pierce, MD;  Location: [facility];  Service: Ophthalmology;  Laterality: Right;  CDE: 9.99   CATARACT EXTRACTION W/PHACO Left 03/26/2020   Procedure: CATARACT EXTRACTION PHACO AND INTRAOCULAR LENS PLACEMENT (IOC);  Surgeon: Fabio Pierce, MD;  Location: [facility];  Service: Ophthalmology;  Laterality: Left;  CDE: 9.16    COLONOSCOPY     HERNIA REPAIR     JOINT REPLACEMENT Bilateral    KNEE ARTHROPLASTY     MELANOMA EXCISION     MELANOMA EXCISION  09/22/2018   on head    Prior to Admission medications   Medication Sig Start Date End Date Taking? Authorizing Provider  acetaminophen (TYLENOL) 650 MG CR tablet Take 1,300 mg by mouth every 8 (eight) hours as needed for pain.   Yes [provider]  atorvastatin (LIPITOR) 10 MG tablet TAKE 1 TABLET(10 MG) BY MOUTH DAILY 08/14/22  Yes Babs Sciara, MD  azithromycin (ZITHROMAX) 250 MG tablet Take by mouth. 08/10/22  Yes [provider]  b complex vitamins tablet Take 1 tablet by mouth daily.   Yes [provider]  beta carotene w/minerals (OCUVITE) tablet Take 1 tablet by mouth daily.   Yes [provider]  Calcium-Magnesium-Zinc [phone] MG TABS Take 3 tablets by mouth daily.   Yes [provider]  cetirizine (ZYRTEC) 10 MG  tablet Take 10 mg by mouth daily.    Yes [provider]  cholecalciferol (VITAMIN D3) 25 MCG (1000 UNIT) tablet Take 1,000 Units by mouth daily.   Yes [provider]  fluorometholone (FML) 0.1 % ophthalmic suspension Place 1 drop into both eyes 3 (three) times daily.  01/04/20  Yes [provider]  FLUoxetine (PROZAC) 20 MG capsule Take 1 capsule (20 mg total) by mouth daily. 08/14/22  Yes Babs Sciara, MD  folic acid (FOLVITE) 1 MG tablet Take 2 tablets (2 mg total) by mouth daily. 12/19/21  Yes Gearldine Bienenstock, PA-C  furosemide (LASIX) 20 MG tablet TAKE 1 TABLET(20 MG) BY MOUTH EVERY MORNING AS NEEDED FOR FLUID IN LEGS 08/14/22  Yes Luking, Jonna Coup, MD  Hypromellose (PURE & GENTLE LUBRICANT) 0.3 % SOLN Apply to eye. 07/14/17  Yes [provider]  mirtazapine (REMERON) 7.5 MG tablet Take 7.5 mg by mouth at bedtime.   Yes [provider]  Multiple Vitamins-Minerals (CENTRUM SILVER PO) Take by mouth.   Yes [provider]  Omega-3 Fatty Acids (SALMON OIL PO) Take 2,000 mg by mouth 2 (two) times daily.   Yes [provider]  omeprazole (PRILOSEC) 40 MG capsule Take 1 capsule (40 mg total) by mouth daily before breakfast. 11/12/22 11/12/23 Yes Tajanae Guilbault, Hennie Duos, DO  OVER THE COUNTER MEDICATION Dr. Margart Sickles foot cream   Yes [provider]  pantoprazole (  PROTONIX) 40 MG tablet Take 40 mg by mouth daily as needed. 10/29/22  Yes [provider]  potassium chloride SA (KLOR-CON M) 20 MEQ tablet 1 qam when using lasix 08/14/22  Yes Luking, Scott A, MD  predniSONE (DELTASONE) 20 MG tablet Take 40 mg by mouth every morning. 08/10/22  Yes [provider]  ranitidine (ZANTAC) 75 MG tablet Take by mouth. 07/14/17  Yes [provider]  tamsulosin (FLOMAX) 0.4 MG CAPS capsule TAKE 2 CAPSULES BY MOUTH EVERY EVENING 08/14/22  Yes Luking, Jonna Coup, MD  vitamin C (ASCORBIC ACID) 500 MG tablet Take 500 mg by mouth daily.    Yes [provider]  Zinc 50 MG TABS Take 50 mg by mouth daily.    Yes [provider]  acetaminophen-codeine (TYLENOL #3) 300-30 MG tablet 1 taken 4 times daily as needed severe pain caution drowsiness not for frequent use Patient not taking: Reported on 12/02/2022 09/30/22   Babs Sciara, MD  aspirin 81 MG EC tablet Take 81 mg by mouth daily.    [provider]  Ferrous Sulfate (IRON) 325 (65 FE) MG TABS Take 325 mg by mouth every other day.    [provider]  methotrexate (RHEUMATREX) 2.5 MG tablet Take 6 tablets (15 mg total) by mouth once a week. Caution:Chemotherapy. Protect from light. 11/24/22   Gearldine Bienenstock, PA-C    Allergies as of 11/12/2022 - Review Complete 11/12/2022  Allergen Reaction Noted   Morphine and related Other (See Comments) 01/04/2013   Acyclovir and related  08/27/2017    Family History  Problem Relation Age of Onset   Rheum arthritis Mother    Emphysema Father    Cancer Brother    Emphysema Sister     Social History   Socioeconomic History   Marital status: Married    Spouse name: Not on file   Number of children: Not on file   Years of education: Not on file   Highest education level: Not on file  Occupational History   Not on file  Tobacco Use   Smoking status: Never    Passive exposure: Current   Smokeless tobacco: Never  Vaping Use   Vaping Use: Never used  Substance and Sexual Activity   Alcohol use: No   Drug use: No   Sexual activity: Not on file  Other Topics Concern   Not on file  Social History Narrative   Not on file   Social Determinants of Health   Financial Resource Strain: Low Risk  (01/08/2021)   Overall Financial Resource Strain (CARDIA)    Difficulty of Paying Living Expenses: Not hard at all  Food Insecurity: No Food Insecurity (01/08/2021)   Hunger Vital Sign    Worried About Running Out of Food in the Last Year: Never true    Ran Out of Food in the Last Year: Never true   Transportation Needs: No Transportation Needs (01/08/2021)   PRAPARE - Administrator, Civil Service (Medical): No    Lack of Transportation (Non-Medical): No  Physical Activity: Inactive (01/08/2021)   Exercise Vital Sign    Days of Exercise per Week: 0 days    Minutes of Exercise per Session: 0 min  Stress: No Stress Concern Present (01/08/2021)   [employer] of Occupational Health - Occupational Stress Questionnaire    Feeling of Stress : Not at all  Social Connections: Moderately Integrated (01/08/2021)   Social Connection and Isolation Panel [NHANES]  Frequency of Communication with Friends and Family: Not on file    Frequency of Social Gatherings with Friends and Family: More than three times a week    Attends Religious Services: More than 4 times per year    Active Member of Clubs or Organizations: No    Attends Banker Meetings: Never    Marital Status: Married  Catering manager Violence: Not At Risk (01/08/2021)   Humiliation, Afraid, Rape, and Kick questionnaire    Fear of Current or Ex-Partner: No    Emotionally Abused: No    Physically Abused: No    Sexually Abused: No    Review of Systems: See HPI, otherwise negative ROS  Physical Exam: Vital signs in last 24 hours: Temp:  [98.8 F (37.1 C)] 98.8 F (37.1 C) (04/22 0914) Pulse Rate:  [62] 62 (04/22 0914) Resp:  [11] 11 (04/22 0914) BP: (174)/(69) 174/69 (04/22 0914) SpO2:  [100 %] 100 % (04/22 0914) Weight:  [82.1 kg] 82.1 kg (04/22 0859)   General:   Alert,  Well-developed, well-nourished, pleasant and cooperative in NAD Head:  Normocephalic and atraumatic. Eyes:  Sclera clear, no icterus.   Conjunctiva pink. Ears:  Normal auditory acuity. Nose:  No deformity, discharge,  or lesions. Msk:  Symmetrical without gross deformities. Normal posture. Extremities:  Without clubbing or edema. Neurologic:  Alert and  oriented x4;  grossly normal neurologically. Skin:  Intact without  significant lesions or rashes. Psych:  Alert and cooperative. Normal mood and affect.  Impression/Plan: Bryan Alderman Sr. is here for a colonoscopy to be performed for colon anemia and heme positive stools.   The risks of the procedure including infection, bleed, or perforation as well as benefits, limitations, alternatives and imponderables have been reviewed with the patient. Questions have been answered. All parties agreeable.

## 2022-12-22 NOTE — Discharge Instructions (Addendum)
  Colonoscopy Discharge Instructions  Read the instructions outlined below and refer to this sheet in the next few weeks. These discharge instructions provide you with general information on caring for yourself after you leave the hospital. Your doctor may also give you specific instructions. While your treatment has been planned according to the most current medical practices available, unavoidable complications occasionally occur.   ACTIVITY You may resume your regular activity, but move at a slower pace for the next 24 hours.  Take frequent rest periods for the next 24 hours.  Walking will help get rid of the air and reduce the bloated feeling in your belly (abdomen).  No driving for 24 hours (because of the medicine (anesthesia) used during the test).   Do not sign any important legal documents or operate any machinery for 24 hours (because of the anesthesia used during the test).  NUTRITION Drink plenty of fluids.  You may resume your normal diet as instructed by your doctor.  Begin with a light meal and progress to your normal diet. Heavy or fried foods are harder to digest and may make you feel sick to your stomach (nauseated).  Avoid alcoholic beverages for 24 hours or as instructed.  MEDICATIONS You may resume your normal medications unless your doctor tells you otherwise.  WHAT YOU CAN EXPECT TODAY Some feelings of bloating in the abdomen.  Passage of more gas than usual.  Spotting of blood in your stool or on the toilet paper.  IF YOU HAD POLYPS REMOVED DURING THE COLONOSCOPY: No aspirin products for 7 days or as instructed.  No alcohol for 7 days or as instructed.  Eat a soft diet for the next 24 hours.  FINDING OUT THE RESULTS OF YOUR TEST Not all test results are available during your visit. If your test results are not back during the visit, make an appointment with your caregiver to find out the results. Do not assume everything is normal if you have not heard from your  caregiver or the medical facility. It is important for you to follow up on all of your test results.  SEEK IMMEDIATE MEDICAL ATTENTION IF: You have more than a spotting of blood in your stool.  Your belly is swollen (abdominal distention).  You are nauseated or vomiting.  You have a temperature over 101.  You have abdominal pain or discomfort that is severe or gets worse throughout the day.   Your colonoscopy revealed 6 polyp(s) which I removed successfully. Await pathology results, my office will contact you.  Given your age, I do not think we need to pursue further colonoscopy for surveillance purposes.  You also have diverticulosis and internal hemorrhoids. I would recommend increasing fiber in your diet or adding OTC Benefiber/Metamucil. Be sure to drink at least 4 to 6 glasses of water daily. Follow-up with GI in 2-3 months. .    I hope you have a great rest of your week!  Hennie Duos. Marletta Lor, D.O. Gastroenterology and Hepatology [facility]

## 2022-12-23 LAB — SURGICAL PATHOLOGY

## 2022-12-26 ENCOUNTER — Encounter (HOSPITAL_COMMUNITY): Payer: Self-pay | Admitting: Internal Medicine

## 2023-01-05 ENCOUNTER — Telehealth: Payer: Self-pay

## 2023-01-05 NOTE — Telephone Encounter (Signed)
Documentation from [facility] Coding needing clarification written out on the paperwork the sent here. Documentation will be on your desk in the tray.

## 2023-01-08 ENCOUNTER — Ambulatory Visit (HOSPITAL_COMMUNITY): Payer: PPO | Attending: Internal Medicine | Admitting: Speech Pathology

## 2023-01-08 ENCOUNTER — Encounter (HOSPITAL_COMMUNITY): Payer: Self-pay | Admitting: Speech Pathology

## 2023-01-08 ENCOUNTER — Ambulatory Visit (HOSPITAL_COMMUNITY)
Admission: RE | Admit: 2023-01-08 | Discharge: 2023-01-08 | Disposition: A | Discharge status: Home / Self Care | Payer: PPO | Source: Ambulatory Visit | Attending: Internal Medicine | Admitting: Internal Medicine

## 2023-01-08 DIAGNOSIS — R059 Cough, unspecified: Secondary | ICD-10-CM | POA: Diagnosis not present

## 2023-01-08 DIAGNOSIS — R1310 Dysphagia, unspecified: Secondary | ICD-10-CM | POA: Diagnosis not present

## 2023-01-08 DIAGNOSIS — R1312 Dysphagia, oropharyngeal phase: Secondary | ICD-10-CM | POA: Diagnosis not present

## 2023-01-08 DIAGNOSIS — K219 Gastro-esophageal reflux disease without esophagitis: Secondary | ICD-10-CM | POA: Diagnosis not present

## 2023-01-08 NOTE — Therapy (Signed)
Modified Barium Swallow Study  Patient Details  Name: Bryan Bentley Vanasten Sr. MRN: [id number] Date of Birth: 1941/07/24  Today's Date: 01/08/2023  Modified Barium Swallow completed.  Full report located under Chart Review in the Imaging Section.  History of Present Illness Bryan Maldonado is an 82 yo male who was referred by Dr. Earnest Bailey for MBSS due to Pt reports of difficulty swallowing.   Clinical Impression Clinical Impression: Pt presents with mild oropharyngeal dysphagia characterized by delay in swallow trigger after filling the pyriforms with liquids. Pt with trace floor of mouth residuals with liquids, likely due to upper and lower dentures, which Pt then swallowed and cleared. Pt had difficulty manipulating the barium tablet orally when paired with thin barium and tilted his head up and back and then aspirated a trace amount of thin barium. Pt with a spontaneous cough, but was unable to fully remove the aspirate. He then swallowed the barium tablet whole in puree without incident. Esophageal sweep was unremarkable. Recommend regular textures and thin liquids with standard aspiration and reflux precautions and Pt to take his medication whole in puree (yogurt/applesauce). No further SLP services indicated at this time. Pt in agreement with plan of care and was provided with written recommendations.  Factors that may increase risk of adverse event in presence of aspiration Rubye Oaks & Clearance Coots 2021):    Swallow Evaluation Recommendations Recommendations: PO diet PO Diet Recommendation: Regular;Thin liquids (Level 0) Liquid Administration via: Cup;Straw Medication Administration: Whole meds with puree Supervision: Patient able to self-feed Swallowing strategies  : Slow rate Postural changes: Position pt fully upright for meals;Stay upright 30-60 min after meals Oral care recommendations: Oral care BID (2x/day);Pt independent with oral care     Thank you,  Havery Moros,  CCC-SLP [phone]  Alasia Enge 01/08/2023,3:57 PM

## 2023-01-13 ENCOUNTER — Other Ambulatory Visit: Payer: Self-pay | Admitting: Physician Assistant

## 2023-01-13 ENCOUNTER — Ambulatory Visit: Payer: PPO | Admitting: Family Medicine

## 2023-01-13 ENCOUNTER — Telehealth: Payer: Self-pay | Admitting: Rheumatology

## 2023-01-13 NOTE — Telephone Encounter (Signed)
Mail box full, RX will be sent today.

## 2023-01-13 NOTE — Telephone Encounter (Signed)
Last Fill: 12/19/2021  Next Visit: 05/20/2023  Last Visit: 12/02/2022  Dx: Rheumatoid arthritis involving multiple sites with positive rheumatoid factor   Current Dose per office note on 12/02/2022: folic acid 1 mg 2 tablets daily   Okay to refill folic acid?

## 2023-01-13 NOTE — Telephone Encounter (Signed)
Pt is calling in to see if he could get a refill on his Rx folic acid 1 mg sent to Pharm: [facility] at [address] in [place], [place].  Pt stated that he only has 4 on hand.

## 2023-01-15 ENCOUNTER — Encounter: Payer: Self-pay | Admitting: Family Medicine

## 2023-01-15 ENCOUNTER — Ambulatory Visit (INDEPENDENT_AMBULATORY_CARE_PROVIDER_SITE_OTHER): Payer: PPO | Admitting: Family Medicine

## 2023-01-15 VITALS — BP 130/76 | HR 64 | Temp 97.3°F | Ht 68.0 in | Wt 182.0 lb

## 2023-01-15 DIAGNOSIS — R3916 Straining to void: Secondary | ICD-10-CM

## 2023-01-15 DIAGNOSIS — N401 Enlarged prostate with lower urinary tract symptoms: Secondary | ICD-10-CM | POA: Diagnosis not present

## 2023-01-15 DIAGNOSIS — D649 Anemia, unspecified: Secondary | ICD-10-CM | POA: Diagnosis not present

## 2023-01-15 DIAGNOSIS — D692 Other nonthrombocytopenic purpura: Secondary | ICD-10-CM | POA: Diagnosis not present

## 2023-01-15 DIAGNOSIS — M0579 Rheumatoid arthritis with rheumatoid factor of multiple sites without organ or systems involvement: Secondary | ICD-10-CM | POA: Diagnosis not present

## 2023-01-15 MED ORDER — IRON 325 (65 FE) MG PO TABS: ORAL_TABLET | ORAL | 6 refills | Status: AC

## 2023-01-15 NOTE — Patient Instructions (Addendum)
Stop aspirin iron tablets one on Mondays and Fridays Follow up in Sept

## 2023-01-15 NOTE — Progress Notes (Signed)
   Subjective:    Patient ID: Bryan Alderman Sr., male    DOB: 02/19/1941, 82 y.o.   MRN: [id number]  HPI Patient with underlying rheumatoid arthritis Also has history of GERD In addition to this he has a history of pedal edema Also long history of severe rheumatoid arthritis Patient also brings in all of his medicines and supplements for review   Review of Systems     Objective:   Physical Exam  General-in no acute distress Eyes-no discharge Lungs-respiratory rate normal, CTA CV-no murmurs,RRR Extremities skin warm dry no edema Neuro grossly normal Behavior normal, alert [name] noted on the arms      Assessment & Plan:  20 minutes spent with patient 1. Benign prostatic hyperplasia (BPH) with straining on urination He is on tamsulosin recheck blood pressure sitting standing no appreciable drop  2. [name] (HCC) [name] noted on the arms I encouraged the patient to stop aspirin  3. Normochromic anemia Takes iron tablets 2 days/week no need to do any blood work currently but we will focus on doing blood work by his next follow-up visit  4. Rheumatoid arthritis involving multiple sites with positive rheumatoid factor (HCC) See special/rheumatologist on a regular basis.

## 2023-01-20 ENCOUNTER — Encounter: Payer: Self-pay | Admitting: Internal Medicine

## 2023-02-24 ENCOUNTER — Ambulatory Visit: Payer: PPO | Admitting: Gastroenterology

## 2023-02-24 ENCOUNTER — Encounter: Payer: Self-pay | Admitting: Gastroenterology

## 2023-02-24 VITALS — BP 133/67 | HR 61 | Temp 98.1°F | Ht 68.0 in | Wt 182.0 lb

## 2023-02-24 DIAGNOSIS — K219 Gastro-esophageal reflux disease without esophagitis: Secondary | ICD-10-CM | POA: Diagnosis not present

## 2023-02-24 NOTE — Progress Notes (Signed)
Gastroenterology Office Note     Primary Care Physician:  Babs Sciara, MD  Primary Gastroenterologist: Dr. Marletta Lor   Chief Complaint   Chief Complaint  Patient presents with   Follow-up    Follow up anemia     History of Present Illness   Bryan Norbut Flores Sr. is an 82 y.o. male presenting today in follow-up with a history of chronic anemia, baseline Hgb 10-12, heme positive stool, referred by PCP a few months ago due to anemia and heme positive stool with strong family history of colon cancer in 2 first-degree relatives. He underwent colonoscopy in interim from last visit April 2024 as noted below. Returns in follow-up after colonoscopy.    FH colon cancer in sister at age 42 and father age 15. Chronic GERD, pill dysphagia, oropharyngeal dysphagia. Evaluated by Speech with no further recommendations. Mild oropharynheral dysphagia noted.   Tums about 3 times a week. Taking omeprazole daily. No esophageal dysphagia. No abdominal pain. Good appetite. No N/V. No overt GI bleeding.   Colonoscopy April 2024: non-bleeding hemorrhoids, diverticulosis, five 3-6 mm polyps in ascending and cecum, one 4 mm polyp in descending colon. No repeat colon due to age.       Past Medical History:  Diagnosis Date   Anemia    Basal cell carcinoma    BPH (benign prostatic hyperplasia)    GERD (gastroesophageal reflux disease)    Melanoma (HCC)    Osteoarthritis    Rheumatoid aortitis    Rheumatoid arthritis [facility])     Past Surgical History:  Procedure Laterality Date   CATARACT EXTRACTION W/PHACO Right 03/12/2020   Procedure: CATARACT EXTRACTION PHACO AND INTRAOCULAR LENS PLACEMENT RIGHT EYE;  Surgeon: Fabio Pierce, MD;  Location: [facility];  Service: Ophthalmology;  Laterality: Right;  CDE: 9.99   CATARACT EXTRACTION W/PHACO Left 03/26/2020   Procedure: CATARACT EXTRACTION PHACO AND INTRAOCULAR LENS PLACEMENT (IOC);  Surgeon: Fabio Pierce, MD;  Location: [facility];  Service: Ophthalmology;   Laterality: Left;  CDE: 9.16    COLONOSCOPY     COLONOSCOPY WITH PROPOFOL N/A 12/22/2022   Procedure: COLONOSCOPY WITH PROPOFOL;  Surgeon: Lanelle Bal, DO;  Location: [facility];  Service: Endoscopy;  Laterality: N/A;  10:15AM;ASA 3   HERNIA REPAIR     JOINT REPLACEMENT Bilateral    KNEE ARTHROPLASTY     MELANOMA EXCISION     MELANOMA EXCISION  09/22/2018   on head   POLYPECTOMY  12/22/2022   Procedure: POLYPECTOMY;  Surgeon: Lanelle Bal, DO;  Location: [facility];  Service: Endoscopy;;    Current Outpatient Medications  Medication Sig Dispense Refill   acetaminophen (TYLENOL) 650 MG CR tablet Take 1,300 mg by mouth every 8 (eight) hours as needed for pain.     atorvastatin (LIPITOR) 10 MG tablet TAKE 1 TABLET(10 MG) BY MOUTH DAILY 90 tablet 1   cetirizine (ZYRTEC) 10 MG tablet Take 10 mg by mouth daily.      cholecalciferol (VITAMIN D3) 25 MCG (1000 UNIT) tablet Take 1,000 Units by mouth daily.     Ferrous Sulfate (IRON) 325 (65 Fe) MG TABS One on Mondays and one on Fridays 30 tablet 6   fluorometholone (FML) 0.1 % ophthalmic suspension Place 1 drop into both eyes 3 (three) times daily.      FLUoxetine (PROZAC) 20 MG capsule Take 1 capsule (20 mg total) by mouth daily. 30 capsule 5   folic acid (FOLVITE) 1 MG tablet TAKE 2 TABLETS BY  MOUTH DAILY 180 tablet 3   furosemide (LASIX) 20 MG tablet TAKE 1 TABLET(20 MG) BY MOUTH EVERY MORNING AS NEEDED FOR FLUID IN LEGS 90 tablet 1   Hypromellose (PURE & GENTLE LUBRICANT) 0.3 % SOLN Apply to eye.     methotrexate (RHEUMATREX) 2.5 MG tablet Take 6 tablets (15 mg total) by mouth once a week. Caution:Chemotherapy. Protect from light. 72 tablet 0   Multiple Vitamins-Minerals (CENTRUM SILVER PO) Take by mouth.     omeprazole (PRILOSEC) 20 MG capsule Take 20 mg by mouth daily.     OVER THE COUNTER MEDICATION Dr. Margart Sickles foot cream     potassium chloride SA (KLOR-CON M) 20 MEQ tablet 1 qam when using lasix 30 tablet 5   tamsulosin  (FLOMAX) 0.4 MG CAPS capsule TAKE 2 CAPSULES BY MOUTH EVERY EVENING 60 capsule 5   vitamin C (ASCORBIC ACID) 500 MG tablet Take 500 mg by mouth daily.     Zinc 50 MG TABS Take 50 mg by mouth daily.      No current facility-administered medications for this visit.    Allergies as of 02/24/2023 - Review Complete 02/24/2023  Allergen Reaction Noted   Morphine and codeine Other (See Comments) 01/04/2013   Acyclovir and related  08/27/2017    Family History  Problem Relation Age of Onset   Rheum arthritis Mother    Emphysema Father    Cancer Brother    Emphysema Sister     Social History   Socioeconomic History   Marital status: Married    Spouse name: Not on file   Number of children: Not on file   Years of education: Not on file   Highest education level: Not on file  Occupational History   Not on file  Tobacco Use   Smoking status: Never    Passive exposure: Current   Smokeless tobacco: Never  Vaping Use   Vaping Use: Never used  Substance and Sexual Activity   Alcohol use: No   Drug use: No   Sexual activity: Not on file  Other Topics Concern   Not on file  Social History Narrative   Not on file   Social Determinants of Health   Financial Resource Strain: Low Risk  (01/08/2021)   Overall Financial Resource Strain (CARDIA)    Difficulty of Paying Living Expenses: Not hard at all  Food Insecurity: No Food Insecurity (01/08/2021)   Hunger Vital Sign    Worried About Running Out of Food in the Last Year: Never true    Ran Out of Food in the Last Year: Never true  Transportation Needs: No Transportation Needs (01/08/2021)   PRAPARE - Administrator, Civil Service (Medical): No    Lack of Transportation (Non-Medical): No  Physical Activity: Inactive (01/08/2021)   Exercise Vital Sign    Days of Exercise per Week: 0 days    Minutes of Exercise per Session: 0 min  Stress: No Stress Concern Present (01/08/2021)   [employer] of Occupational Health -  Occupational Stress Questionnaire    Feeling of Stress : Not at all  Social Connections: Moderately Integrated (01/08/2021)   Social Connection and Isolation Panel [NHANES]    Frequency of Communication with Friends and Family: Not on file    Frequency of Social Gatherings with Friends and Family: More than three times a week    Attends Religious Services: More than 4 times per year    Active Member of Clubs or Organizations: No  Attends Banker Meetings: Never    Marital Status: Married  Catering manager Violence: Not At Risk (01/08/2021)   Humiliation, Afraid, Rape, and Kick questionnaire    Fear of Current or Ex-Partner: No    Emotionally Abused: No    Physically Abused: No    Sexually Abused: No     Review of Systems   Gen: Denies any fever, chills, fatigue, weight loss, lack of appetite.  CV: Denies chest pain, heart palpitations, peripheral edema, syncope.  Resp: Denies shortness of breath at rest or with exertion. Denies wheezing or cough.  GI: Denies dysphagia or odynophagia. Denies jaundice, hematemesis, fecal incontinence. GU : Denies urinary burning, urinary frequency, urinary hesitancy MS: Denies joint pain, muscle weakness, cramps, or limitation of movement.  Derm: Denies rash, itching, dry skin Psych: Denies depression, anxiety, memory loss, and confusion Heme: Denies bruising, bleeding, and enlarged lymph nodes.   Physical Exam   BP 133/67   Pulse 61   Temp 98.1 F (36.7 C)   Ht 5\' 8"  (1.727 m)   Wt 182 lb (82.6 kg)   BMI 27.67 kg/m  General:   Alert and oriented. Pleasant and cooperative. Well-nourished and well-developed.  Head:  Normocephalic and atraumatic. Eyes:  Without icterus Abdomen:  +BS, soft, non-tender and non-distended. No HSM noted. No guarding or rebound. No masses appreciated.  Rectal:  Deferred  Msk:  Symmetrical without gross deformities. Normal posture. Extremities:  Without edema. Neurologic:  Alert and  oriented x4;   grossly normal neurologically. Skin:  Intact without significant lesions or rashes. Psych:  Alert and cooperative. Normal mood and affect.  Lab Results  Component Value Date   IRON 55 09/08/2022   TIBC 285 09/08/2022   FERRITIN 72 09/08/2022      Assessment   Bryan A Rance Sr. is an 82 y.o. male presenting today in follow-up with a history of chronic anemia, baseline Hgb 10-12, heme positive stool, referred by PCP a few months ago due to anemia and heme positive stool with strong family history of colon cancer in 2 first-degree relatives. He underwent colonoscopy in interim from last visit April 2024. Now returning for follow-up.  Review of chart notes Hgb is stable. Ferritin earlier this year normal at 72 and iron 55. He has had no overt GI bleeding or lower GI concerns.  GERD controlled with omeprazole daily; he will occasionally need Tums for breakthrough. Denying esophageal dysphagia. Holding off on EGD unless worsening anemia or evidence for IDA or melena.   PLAN    Continue PPI daily Call if overt GI bleeding Call if difficulty with GERD, dysphagia, etc Return in 1 year   Bryan Mink, PhD, [facility]

## 2023-02-24 NOTE — Patient Instructions (Signed)
Continue omeprazole once daily, 30 minutes before breakfast.  Call if you have any black stool or bright red stool.  Call if concerns with swallowing or worsening reflux.  We will see you back in 1 year!   I enjoyed seeing you again today! I value our relationship and want to provide genuine, compassionate, and quality care. You may receive a survey regarding your visit with me, and I welcome your feedback! Thanks so much for taking the time to complete this. I look forward to seeing you again.      Gelene Mink, PhD, ANP-BC [facility]

## 2023-02-25 ENCOUNTER — Other Ambulatory Visit: Payer: Self-pay | Admitting: Physician Assistant

## 2023-02-25 ENCOUNTER — Other Ambulatory Visit: Payer: Self-pay | Admitting: *Deleted

## 2023-02-25 DIAGNOSIS — Z79899 Other long term (current) drug therapy: Secondary | ICD-10-CM

## 2023-02-25 DIAGNOSIS — M0579 Rheumatoid arthritis with rheumatoid factor of multiple sites without organ or systems involvement: Secondary | ICD-10-CM

## 2023-02-25 NOTE — Telephone Encounter (Signed)
Last Fill: 11/24/2022  Labs: 11/24/2022 CMP WNL. Patient remains anemic but it is improving. Rest of CBC WNL.   Next Visit: 05/20/2023  Last Visit: 12/02/2022  DX: Rheumatoid arthritis involving multiple sites with positive rheumatoid factor   Current Dose per office note 12/02/2022: Methotrexate 2.5 mg 6 tablets by mouth every 7 days   Patient advised he is due to update lab work. Patient pans to go today to update.   Okay to refill Methotrexate?

## 2023-02-26 LAB — CMP14+EGFR
ALT: 13 IU/L (ref 0–44)
AST: 21 IU/L (ref 0–40)
Albumin: 3.9 g/dL (ref 3.7–4.7)
Alkaline Phosphatase: 88 IU/L (ref 44–121)
BUN/Creatinine Ratio: 19 (ref 10–24)
BUN: 15 mg/dL (ref 8–27)
Bilirubin Total: 0.5 mg/dL (ref 0.0–1.2)
CO2: 23 mmol/L (ref 20–29)
Calcium: 8.7 mg/dL (ref 8.6–10.2)
Chloride: 105 mmol/L (ref 96–106)
Creatinine, Ser: 0.79 mg/dL (ref 0.76–1.27)
Globulin, Total: 2.3 g/dL (ref 1.5–4.5)
Glucose: 146 mg/dL — ABNORMAL HIGH (ref 70–99)
Potassium: 4.6 mmol/L (ref 3.5–5.2)
Sodium: 141 mmol/L (ref 134–144)
Total Protein: 6.2 g/dL (ref 6.0–8.5)
eGFR: 89 mL/min/{1.73_m2} (ref >59)

## 2023-02-26 LAB — CBC WITH DIFFERENTIAL/PLATELET
Basophils Absolute: 0 10*3/uL (ref 0.0–0.2)
Basos: 0 %
EOS (ABSOLUTE): 0.2 10*3/uL (ref 0.0–0.4)
Eos: 3 %
Hematocrit: 31.4 % — ABNORMAL LOW (ref 37.5–51.0)
Hemoglobin: 10.5 g/dL — ABNORMAL LOW (ref 13.0–17.7)
Immature Grans (Abs): 0 10*3/uL (ref 0.0–0.1)
Immature Granulocytes: 0 %
Lymphocytes Absolute: 2.3 10*3/uL (ref 0.7–3.1)
Lymphs: 42 %
MCH: 33.5 pg — ABNORMAL HIGH (ref 26.6–33.0)
MCHC: 33.4 g/dL (ref 31.5–35.7)
MCV: 100 fL — ABNORMAL HIGH (ref 79–97)
Monocytes Absolute: 0.4 10*3/uL (ref 0.1–0.9)
Monocytes: 7 %
Neutrophils Absolute: 2.5 10*3/uL (ref 1.4–7.0)
Neutrophils: 48 %
Platelets: 183 10*3/uL (ref 150–450)
RBC: 3.13 x10E6/uL — ABNORMAL LOW (ref 4.14–5.80)
RDW: 14 % (ref 11.6–15.4)
WBC: 5.4 10*3/uL (ref 3.4–10.8)

## 2023-02-26 NOTE — Progress Notes (Signed)
Hemoglobin is low at 10.5.  MCV is elevated.  Please remind patient to take folic acid on a regular basis.  Glucose is elevated at 146.  Please forward results to his PCP.

## 2023-02-27 ENCOUNTER — Other Ambulatory Visit: Payer: Self-pay

## 2023-02-27 DIAGNOSIS — R739 Hyperglycemia, unspecified: Secondary | ICD-10-CM

## 2023-03-03 DIAGNOSIS — R739 Hyperglycemia, unspecified: Secondary | ICD-10-CM | POA: Diagnosis not present

## 2023-03-04 ENCOUNTER — Encounter: Payer: Self-pay | Admitting: Family Medicine

## 2023-03-04 LAB — HEMOGLOBIN A1C
Est. average glucose Bld gHb Est-mCnc: 137 mg/dL
Hgb A1c MFr Bld: 6.4 % — ABNORMAL HIGH (ref 4.8–5.6)

## 2023-03-04 NOTE — Progress Notes (Signed)
Please mail to patient

## 2023-03-20 ENCOUNTER — Telehealth: Payer: Self-pay

## 2023-03-20 ENCOUNTER — Other Ambulatory Visit: Payer: Self-pay | Admitting: Family Medicine

## 2023-03-20 NOTE — Telephone Encounter (Signed)
Based on his previous lab work it is not necessary for him to do additional lab work before that visit thank you

## 2023-03-20 NOTE — Telephone Encounter (Signed)
Does pt need blood work before his Sept appt   Bryan Maldonado SR [phone]

## 2023-03-23 NOTE — Telephone Encounter (Signed)
Patient notified

## 2023-03-31 ENCOUNTER — Other Ambulatory Visit: Payer: Self-pay | Admitting: Physician Assistant

## 2023-03-31 NOTE — Telephone Encounter (Signed)
Last Fill: 02/25/2023 (30 day supply)  Labs: 02/25/2023 Hemoglobin is low at 10.5.  MCV is elevated. Glucose is elevated at 146.     Next Visit: 05/20/2023  Last Visit: 12/02/2022  DX: Rheumatoid arthritis involving multiple sites with positive rheumatoid factor   Current Dose per office note 12/02/2022: Methotrexate 2.5 mg 6 tablets by mouth every 7 days   Okay to refill Methotrexate?

## 2023-04-02 ENCOUNTER — Emergency Department (HOSPITAL_COMMUNITY)
Admission: EM | Admit: 2023-04-02 | Discharge: 2023-04-02 | Disposition: A | Discharge status: Home / Self Care | Payer: PPO | Source: Home / Self Care | Attending: Emergency Medicine | Admitting: Emergency Medicine

## 2023-04-02 ENCOUNTER — Encounter (HOSPITAL_COMMUNITY): Payer: Self-pay

## 2023-04-02 ENCOUNTER — Other Ambulatory Visit: Payer: Self-pay

## 2023-04-02 ENCOUNTER — Emergency Department (HOSPITAL_COMMUNITY): Payer: PPO

## 2023-04-02 DIAGNOSIS — I4891 Unspecified atrial fibrillation: Secondary | ICD-10-CM | POA: Diagnosis not present

## 2023-04-02 DIAGNOSIS — E860 Dehydration: Secondary | ICD-10-CM

## 2023-04-02 DIAGNOSIS — R55 Syncope and collapse: Secondary | ICD-10-CM | POA: Diagnosis not present

## 2023-04-02 DIAGNOSIS — R61 Generalized hyperhidrosis: Secondary | ICD-10-CM | POA: Diagnosis not present

## 2023-04-02 DIAGNOSIS — R600 Localized edema: Secondary | ICD-10-CM | POA: Insufficient documentation

## 2023-04-02 DIAGNOSIS — I951 Orthostatic hypotension: Secondary | ICD-10-CM

## 2023-04-02 DIAGNOSIS — S63122A Subluxation of unspecified interphalangeal joint of left thumb, initial encounter: Secondary | ICD-10-CM | POA: Diagnosis not present

## 2023-04-02 DIAGNOSIS — M1812 Unilateral primary osteoarthritis of first carpometacarpal joint, left hand: Secondary | ICD-10-CM | POA: Diagnosis not present

## 2023-04-02 LAB — MAGNESIUM: Magnesium: 2.3 mg/dL (ref 1.7–2.4)

## 2023-04-02 LAB — COMPREHENSIVE METABOLIC PANEL
ALT: 18 U/L (ref 0–44)
AST: 23 U/L (ref 15–41)
Albumin: 4 g/dL (ref 3.5–5.0)
Alkaline Phosphatase: 69 U/L (ref 38–126)
Anion gap: 9 (ref 5–15)
BUN: 19 mg/dL (ref 8–23)
CO2: 26 mmol/L (ref 22–32)
Calcium: 9.1 mg/dL (ref 8.9–10.3)
Chloride: 103 mmol/L (ref 98–111)
Creatinine, Ser: 1.16 mg/dL (ref 0.61–1.24)
GFR, Estimated: >60 mL/min (ref >60)
Glucose, Bld: 151 mg/dL — ABNORMAL HIGH (ref 70–99)
Potassium: 4.1 mmol/L (ref 3.5–5.1)
Sodium: 138 mmol/L (ref 135–145)
Total Bilirubin: 1.5 mg/dL — ABNORMAL HIGH (ref 0.3–1.2)
Total Protein: 7.2 g/dL (ref 6.5–8.1)

## 2023-04-02 LAB — CBC WITH DIFFERENTIAL/PLATELET
Abs Immature Granulocytes: 0.02 10*3/uL (ref 0.00–0.07)
Basophils Absolute: 0 10*3/uL (ref 0.0–0.1)
Basophils Relative: 0 %
Eosinophils Absolute: 0.1 10*3/uL (ref 0.0–0.5)
Eosinophils Relative: 1 %
HCT: 33.7 % — ABNORMAL LOW (ref 39.0–52.0)
Hemoglobin: 11 g/dL — ABNORMAL LOW (ref 13.0–17.0)
Immature Granulocytes: 0 %
Lymphocytes Relative: 29 %
Lymphs Abs: 1.8 10*3/uL (ref 0.7–4.0)
MCH: 33.2 pg (ref 26.0–34.0)
MCHC: 32.6 g/dL (ref 30.0–36.0)
MCV: 101.8 fL — ABNORMAL HIGH (ref 80.0–100.0)
Monocytes Absolute: 0.4 10*3/uL (ref 0.1–1.0)
Monocytes Relative: 7 %
Neutro Abs: 4 10*3/uL (ref 1.7–7.7)
Neutrophils Relative %: 63 %
Platelets: 163 10*3/uL (ref 150–400)
RBC: 3.31 MIL/uL — ABNORMAL LOW (ref 4.22–5.81)
RDW: 14.3 % (ref 11.5–15.5)
WBC: 6.3 10*3/uL (ref 4.0–10.5)
nRBC: 0 % (ref 0.0–0.2)

## 2023-04-02 LAB — BRAIN NATRIURETIC PEPTIDE: B Natriuretic Peptide: 111 pg/mL — ABNORMAL HIGH (ref 0.0–100.0)

## 2023-04-02 LAB — TSH: TSH: 2.945 u[IU]/mL (ref 0.350–4.500)

## 2023-04-02 MED ORDER — LACTATED RINGERS IV BOLUS
500.0000 mL | Freq: Once | INTRAVENOUS | Status: AC
  Administered 2023-04-02: 500 mL via INTRAVENOUS

## 2023-04-02 NOTE — ED Notes (Signed)
No pacemaker present  

## 2023-04-02 NOTE — ED Triage Notes (Addendum)
[facility] called out for pt having a syncopal episode after mowing the lawn for over 4 hours and not drinking water. Pt has new onset of a-fib. Pt does not complain of any pain, and did not "pass out." Pt remembers falling to the ground but took "a while" to respond. Per EMS pt showed orthostatic hypotension with systolic's 150 laying and 100 sitting.

## 2023-04-02 NOTE — Discharge Instructions (Addendum)
You were seen for your passing out in the emergency department.  It was likely due to dehydration.  At home, please stay well-hydrated if you are outside.    Follow-up with your primary doctor in 2-3 days regarding your visit.  Cardiology will be calling you regarding an appointment within the next 72 hours.  You may contact them if you do not hear from them in that time using the information in this packet.  Return immediately to the emergency department if you experience any of the following: Fainting, worsening pain, difficulty breathing, unexplained vomiting or sweating, or any other concerning symptoms.    Thank you for visiting our Emergency Department. It was a pleasure taking care of you today.

## 2023-04-02 NOTE — ED Provider Notes (Signed)
[place] EMERGENCY DEPARTMENT AT [facility] Provider Note   CSN: [id number] Arrival date & time: 04/02/23  1516     History  Chief Complaint  Patient presents with   Near Syncope    Bryan Limbo Rosenberg Sr. is a 82 y.o. male.  82 year old male with a history of rheumatoid arthritis and lower extremity edema on Lasix who presents emergency department syncopal episode.  Was out mowing grass for 4 hours and then went home.  Social worker came to his house when he got up to go to the door he says that he started feeling lightheaded and going to the ground and caught himself on the wall and then slowly went to the floor.  Did not strike his head.  Thinks that he fully lost consciousness.  Not on any blood thinners.  No preceding symptoms otherwise and says that his only pains and swelling is his left thumb.  EMS reported that he was in atrial fibrillation and did give him some fluids after he was found to be orthostatic.       Home Medications Prior to Admission medications   Medication Sig Start Date End Date Taking? Authorizing Provider  acetaminophen (TYLENOL) 650 MG CR tablet Take 1,300 mg by mouth every 8 (eight) hours as needed for pain.    [provider]  atorvastatin (LIPITOR) 10 MG tablet TAKE 1 TABLET(10 MG) BY MOUTH DAILY 08/14/22   Babs Sciara, MD  cetirizine (ZYRTEC) 10 MG tablet Take 10 mg by mouth daily.     [provider]  cholecalciferol (VITAMIN D3) 25 MCG (1000 UNIT) tablet Take 1,000 Units by mouth daily.    [provider]  Ferrous Sulfate (IRON) 325 (65 Fe) MG TABS One on Mondays and one on Fridays 01/15/23   Babs Sciara, MD  fluorometholone (FML) 0.1 % ophthalmic suspension Place 1 drop into both eyes 3 (three) times daily.  01/04/20   [provider]  FLUoxetine (PROZAC) 20 MG capsule Take 1 capsule (20 mg total) by mouth daily. 08/14/22   Babs Sciara, MD  folic acid (FOLVITE) 1 MG tablet TAKE 2 TABLETS BY MOUTH  DAILY 01/13/23   Gearldine Bienenstock, PA-C  furosemide (LASIX) 20 MG tablet TAKE 1 TABLET(20 MG) BY MOUTH EVERY MORNING AS NEEDED FOR FLUID IN LEGS 08/14/22   Babs Sciara, MD  Hypromellose (PURE & GENTLE LUBRICANT) 0.3 % SOLN Apply to eye. 07/14/17   [provider]  methotrexate (RHEUMATREX) 2.5 MG tablet TAKE 6 TABLETS BY MOUTH ONCE WEEKLY. 03/31/23   Gearldine Bienenstock, PA-C  Multiple Vitamins-Minerals (CENTRUM SILVER PO) Take by mouth.    [provider]  omeprazole (PRILOSEC) 20 MG capsule TAKE 1 CAPSULE BY MOUTH ONCE A DAY. 03/20/23   Babs Sciara, MD  OVER THE COUNTER MEDICATION Dr. Margart Sickles foot cream    [provider]  potassium chloride SA (KLOR-CON M) 20 MEQ tablet 1 qam when using lasix 08/14/22   Babs Sciara, MD  tamsulosin (FLOMAX) 0.4 MG CAPS capsule TAKE 2 CAPSULES BY MOUTH EVERY EVENING 08/14/22   Babs Sciara, MD  vitamin C (ASCORBIC ACID) 500 MG tablet Take 500 mg by mouth daily.    [provider]  Zinc 50 MG TABS Take 50 mg by mouth daily.     [provider]      Allergies    Morphine and codeine and Acyclovir and related    Review of Systems  Review of Systems  Physical Exam Updated Vital Signs BP (!) 148/63   Pulse (!) 59   Temp 98.4 F (36.9 C) (Oral)   Resp 12   Ht 5\' 8"  (1.727 m)   Wt 80.2 kg   SpO2 100%   BMI 26.88 kg/m  Physical Exam Vitals and nursing note reviewed.  Constitutional:      General: He is not in acute distress.    Appearance: He is well-developed.  HENT:     Head: Normocephalic and atraumatic.     Right Ear: External ear normal.     Left Ear: External ear normal.     Nose: Nose normal.  Eyes:     Extraocular Movements: Extraocular movements intact.     Conjunctiva/sclera: Conjunctivae normal.     Pupils: Pupils are equal, round, and reactive to light.  Cardiovascular:     Rate and Rhythm: Normal rate. Rhythm irregular.     Heart sounds: Normal heart sounds.     Comments: Sinus  rhythm with PACs on the monitor Pulmonary:     Effort: Pulmonary effort is normal. No respiratory distress.     Breath sounds: Normal breath sounds.  Musculoskeletal:     Cervical back: Normal range of motion and neck supple.     Right lower leg: Edema present.     Left lower leg: Edema present.  Skin:    General: Skin is warm and dry.  Neurological:     Mental Status: He is alert. Mental status is at baseline.  Psychiatric:        Mood and Affect: Mood normal.        Behavior: Behavior normal.     ED Results / Procedures / Treatments   Labs (all labs ordered are listed, but only abnormal results are displayed) Labs Reviewed  CBC WITH DIFFERENTIAL/PLATELET - Abnormal; Notable for the following components:      Result Value   RBC 3.31 (*)    Hemoglobin 11.0 (*)    HCT 33.7 (*)    MCV 101.8 (*)    All other components within normal limits  COMPREHENSIVE METABOLIC PANEL - Abnormal; Notable for the following components:   Glucose, Bld 151 (*)    Total Bilirubin 1.5 (*)    All other components within normal limits  BRAIN NATRIURETIC PEPTIDE - Abnormal; Notable for the following components:   B Natriuretic Peptide 111.0 (*)    All other components within normal limits  MAGNESIUM  TSH    EKG EKG Interpretation Date/Time:  Thursday April 02 2023 15:27:01 EDT Ventricular Rate:  72 PR Interval:  219 QRS Duration:  92 QT Interval:  418 QTC Calculation: 404 R Axis:   73  Text Interpretation: Sinus rhythm with pacs Borderline prolonged PR interval Confirmed by Vonita Moss [phone]) on 04/02/2023 3:29:26 PM  Radiology DG Finger Thumb Left  Result Date: 04/02/2023 CLINICAL DATA:  Left thumb pain after fall. EXAM: LEFT THUMB 2+V COMPARISON:  May 07, 2018. FINDINGS: There is no evidence of fracture. Stable subluxation of the first interphalangeal joint is noted compared to prior exam. Mild degenerative changes are seen involving the first carpometacarpal and first  metacarpophalangeal joints. Soft tissues are unremarkable. IMPRESSION: Stable chronic subluxation of first interphalangeal joint. No acute abnormality is noted. Electronically Signed   By: Lupita Raider M.D.   On: 04/02/2023 16:37   DG Chest Port 1 View  Result Date: 04/02/2023 CLINICAL DATA:  Syncope. EXAM: PORTABLE CHEST 1 VIEW COMPARISON:  None  Available. FINDINGS: The heart size and mediastinal contours are within normal limits. Both lungs are clear. Degenerative changes are seen involving both shoulders. IMPRESSION: No active disease. Electronically Signed   By: Lupita Raider M.D.   On: 04/02/2023 16:34    Procedures Procedures    Medications Ordered in ED Medications  lactated ringers bolus 500 mL (0 mLs Intravenous Stopped 04/02/23 1907)    ED Course/ Medical Decision Making/ A&P                                 Medical Decision Making Amount and/or Complexity of Data Reviewed Labs: ordered. Radiology: ordered.   Bryan Limbo Ludvigsen Sr. is a 82 y.o. male with comorbidities that complicate the patient evaluation including rheumatoid arthritis and lower extremity edema on Lasix who presents emergency department syncopal episode.   Initial Ddx:  Orthostasis, cardiogenic syncope, dehydration, MI, PE, atrial fibrillation  MDM/Course:  Patient presented to the emergency department with a syncopal episode after standing up to go to the door.  This is in the setting of mowing grass outside in the summer heat for 4 hours prior to this.  No head strike or external signs of trauma.  Not on any blood thinners.  EMS did report atrial fibrillation but his EKG on arrival shows a sinus rhythm with occasional PACs.  No other concerning findings on his EKG such as signs of Brugada, WPW, or long QT.  Patient was complaining of left thumb pain which did not show any fracture on x-ray.  Does have some mild lower extremity edema and so was given 500 mL of fluids to avoid volume overload.  His lab work  returned unremarkable.  Will have him follow-up with cardiology regarding his syncopal event and possible need for echo with his lower extremity edema and mildly elevated BNP.  However, do feel that this episode was likely due to orthostasis rather than an arrhythmia as the cause.  This patient presents to the ED for concern of complaints listed in HPI, this involves an extensive number of treatment options, and is a complaint that carries with it a high risk of complications and morbidity. Disposition including potential need for admission considered.   Dispo: DC Home. Return precautions discussed including, but not limited to, those listed in the AVS. Allowed pt time to ask questions which were answered fully prior to dc.  Additional history obtained from EMS Records reviewed Outpatient Clinic Notes The following labs were independently interpreted: Chemistry and show no acute abnormality I independently reviewed the following imaging with scope of interpretation limited to determining acute life threatening conditions related to emergency care: Chest x-ray and agree with the radiologist interpretation with the following exceptions: none I personally reviewed and interpreted cardiac monitoring: normal sinus rhythm  I personally reviewed and interpreted the pt's EKG: see above for interpretation  I have reviewed the patients home medications and made adjustments as needed Social Determinants of health:  Elderly         Final Clinical Impression(s) / ED Diagnoses Final diagnoses:  Syncope and collapse  Orthostasis  Dehydration    Rx / DC Orders ED Discharge Orders          Ordered    Ambulatory referral to Cardiology        04/02/23 1658              Rondel Baton, MD 04/03/23 1203

## 2023-04-09 DIAGNOSIS — M79675 Pain in left toe(s): Secondary | ICD-10-CM | POA: Diagnosis not present

## 2023-04-09 DIAGNOSIS — M069 Rheumatoid arthritis, unspecified: Secondary | ICD-10-CM | POA: Diagnosis not present

## 2023-04-09 DIAGNOSIS — B351 Tinea unguium: Secondary | ICD-10-CM | POA: Diagnosis not present

## 2023-04-09 DIAGNOSIS — M79674 Pain in right toe(s): Secondary | ICD-10-CM | POA: Diagnosis not present

## 2023-04-10 ENCOUNTER — Telehealth: Payer: Self-pay | Admitting: *Deleted

## 2023-04-10 NOTE — Telephone Encounter (Signed)
Transition Care Management Follow-up Telephone Call Date of discharge and from where: [facility]23 How have you been since you were released from the hospital? Just a little sore and bruised  Any questions or concerns? No  Items Reviewed: Did the pt receive and understand the discharge instructions provided? Yes  Medications obtained and verified? No  Other? No  Any new allergies since your discharge? No  Dietary orders reviewed? No Do you have support at home? Yes      Follow up appointments reviewed:  PCP Hospital f/u appt confirmed? No  Maybe  Are transportation arrangements needed? No  If their condition worsens, is the pt aware to call PCP or go to the Emergency Dept.? Yes Was the patient provided with contact information for the PCP's office or ED? Yes Was to pt encouraged to call back with questions or concerns? Yes

## 2023-05-08 NOTE — Progress Notes (Signed)
Office Visit Note  Patient: Bryan Cooling Allocca Sr.             Date of Birth: May 23, 1941           MRN: [id number]             PCP: Babs Sciara, MD Referring: Babs Sciara, MD Visit Date: 05/20/2023 Occupation: @GUAROCC @  Subjective:  Medication management  History of Present Illness: Bryan BRIMMER Sr. is a 82 y.o. male with seropositive rheumatoid arthritis and osteoarthritis.  He was accompanied by his wife today.  He states he continues to have some stiffness in his joints.  He has difficulty doing chores due to limited range of motion in his joints.  He has been taking methotrexate 6 tablets weekly along with folic acid 2 tablets daily.  He has not noticed any increased joint pain or joint swelling.  Patient recently celebrated 64th wedding anniversary.    Activities of Daily Living:  Patient reports morning stiffness for a few minutes intermittently.  Patient Reports nocturnal pain.  Difficulty dressing/grooming: Reports Difficulty climbing stairs: Denies Difficulty getting out of chair: Denies Difficulty using hands for taps, buttons, cutlery, and/or writing: Reports  Review of Systems  Constitutional:  Positive for fatigue.  HENT:  Positive for mouth dryness. Negative for mouth sores.   Eyes:  Positive for dryness.  Respiratory:  Positive for shortness of breath.   Cardiovascular:  Positive for chest pain and swelling in legs/feet. Negative for palpitations.  Gastrointestinal:  Positive for constipation. Negative for blood in stool and diarrhea.  Endocrine: Negative for increased urination.  Genitourinary:  Positive for involuntary urination.  Musculoskeletal:  Positive for joint pain, gait problem, joint pain, joint swelling, myalgias, muscle weakness, morning stiffness, muscle tenderness and myalgias.  Skin:  Negative for color change, rash, hair loss and sensitivity to sunlight.  Allergic/Immunologic: Negative for susceptible to infections.  Neurological:  Negative  for dizziness and headaches.  Hematological:  Negative for swollen glands.  Psychiatric/Behavioral:  Positive for sleep disturbance. Negative for depressed mood. The patient is not nervous/anxious.     PMFS History:  Patient Active Problem List   Diagnosis Date Noted   [name] (HCC) 01/29/2021   Anemia, chronic disease 06/25/2020   High risk medication use 09/19/2016   History of total knee replacement, bilateral 09/19/2016   Gastroesophageal reflux disease without esophagitis 09/19/2016   History of melanoma 09/19/2016   History of basal cell carcinoma 09/19/2016   History of anemia 09/19/2016   Allergic rhinitis 02/18/2016   Prediabetes 11/20/2014   Hyperlipidemia 11/20/2014   BPH (benign prostatic hyperplasia) 05/22/2014   Rheumatoid arthritis involving multiple sites with positive rheumatoid factor (HCC) 01/05/2013   Chronic pain syndrome 01/05/2013    Past Medical History:  Diagnosis Date   Anemia    Basal cell carcinoma    BPH (benign prostatic hyperplasia)    GERD (gastroesophageal reflux disease)    Melanoma (HCC)    Osteoarthritis    Rheumatoid aortitis    Rheumatoid arthritis (HCC)     Family History  Problem Relation Age of Onset   Rheum arthritis Mother    Emphysema Father    Cancer Brother    Emphysema Sister    Past Surgical History:  Procedure Laterality Date   CATARACT EXTRACTION W/PHACO Right 03/12/2020   Procedure: CATARACT EXTRACTION PHACO AND INTRAOCULAR LENS PLACEMENT RIGHT EYE;  Surgeon: Fabio Pierce, MD;  Location: [facility];  Service: Ophthalmology;  Laterality: Right;  CDE: 9.99  CATARACT EXTRACTION W/PHACO Left 03/26/2020   Procedure: CATARACT EXTRACTION PHACO AND INTRAOCULAR LENS PLACEMENT (IOC);  Surgeon: Fabio Pierce, MD;  Location: [facility];  Service: Ophthalmology;  Laterality: Left;  CDE: 9.16    COLONOSCOPY     COLONOSCOPY WITH PROPOFOL N/A 12/22/2022   Procedure: COLONOSCOPY WITH PROPOFOL;  Surgeon: Lanelle Bal, DO;   Location: [facility];  Service: Endoscopy;  Laterality: N/A;  10:15AM;ASA 3   ESOPHAGEAL DILATION     HERNIA REPAIR     JOINT REPLACEMENT Bilateral    KNEE ARTHROPLASTY     MELANOMA EXCISION     MELANOMA EXCISION  09/22/2018   on head   POLYPECTOMY  12/22/2022   Procedure: POLYPECTOMY;  Surgeon: Lanelle Bal, DO;  Location: [facility];  Service: Endoscopy;;   Social History   Social History Narrative   Not on file   Immunization History  Administered Date(s) Administered   Fluad Quad(high Dose 65+) 06/25/2020, 06/06/2021, 06/30/2022   Influenza Split 05/19/2013   Influenza, High Dose Seasonal PF 05/20/2019   Influenza,inj,Quad PF,6+ Mos 05/22/2014, 05/22/2015, 06/06/2016, 06/10/2017, 05/14/2018   Influenza-Unspecified 05/20/2019   PFIZER(Purple Top)SARS-COV-2 Vaccination 11/14/2019, 12/05/2019   Pneumococcal Conjugate-13 05/22/2014   Pneumococcal Polysaccharide-23 05/19/2013   Zoster Recombinant(Shingrix) 09/06/2018, 03/11/2019     Objective: Vital Signs: BP (!) 118/52 (BP Location: Left Arm, Patient Position: Sitting, Cuff Size: Normal)   Pulse 72   Ht 5\' 8"  (1.727 m)   Wt 179 lb 12.8 oz (81.6 kg)   BMI 27.34 kg/m    Physical Exam Vitals and nursing note reviewed.  Constitutional:      Appearance: He is well-developed.  HENT:     Head: Normocephalic and atraumatic.  Eyes:     Conjunctiva/sclera: Conjunctivae normal.     Pupils: Pupils are equal, round, and reactive to light.  Cardiovascular:     Rate and Rhythm: Normal rate and regular rhythm.     Heart sounds: Normal heart sounds.  Pulmonary:     Effort: Pulmonary effort is normal.     Breath sounds: Normal breath sounds.  Abdominal:     General: Bowel sounds are normal.     Palpations: Abdomen is soft.  Musculoskeletal:     Cervical back: Normal range of motion and neck supple.     Right lower leg: Edema present.     Left lower leg: Edema present.  Skin:    General: Skin is warm and dry.      Capillary Refill: Capillary refill takes less than 2 seconds.  Neurological:     Mental Status: He is alert and oriented to person, place, and time.  Psychiatric:        Behavior: Behavior normal.      Musculoskeletal Exam: He had limited lateral rotation and flexion extension of the cervical spine.  Thoracic kyphosis was noted.  He had no tenderness over thoracic or lumbar spine.  Shoulder abduction and forward flexion were limited to 90 degrees and had limited internal rotation.  Elbow joint contractures were noted in bilateral elbows with no synovitis.  He had very limited range of motion of bilateral wrist joints with no synovitis.  Synovial thickening was noted.  Bilateral MCP joint synovial thickening and swan-neck deformities in his PIPs were noted.  Hip joints were difficult to assess in the sitting position.  Knee joints were replaced and had limited extension without any swelling.  He had significant lower extremity pedal edema.  There was no tenderness over ankles  or MTPs.  CDAI Exam: CDAI Score: -- Patient Global: 40 / 100; Provider Global: 40 / 100 Swollen: --; Tender: -- Joint Exam 05/20/2023   No joint exam has been documented for this visit     Investigation: No additional findings.  Imaging: No results found.  Recent Labs: Lab Results  Component Value Date   WBC 6.3 04/02/2023   HGB 11.0 (L) 04/02/2023   PLT 163 04/02/2023   NA 138 04/02/2023   K 4.1 04/02/2023   CL 103 04/02/2023   CO2 26 04/02/2023   GLUCOSE 151 (H) 04/02/2023   BUN 19 04/02/2023   CREATININE 1.16 04/02/2023   BILITOT 1.5 (H) 04/02/2023   ALKPHOS 69 04/02/2023   AST 23 04/02/2023   ALT 18 04/02/2023   PROT 7.2 04/02/2023   ALBUMIN 4.0 04/02/2023   CALCIUM 9.1 04/02/2023   GFRAA 96 01/14/2021    Speciality Comments: No specialty comments available.  Procedures:  No procedures performed Allergies: Morphine and codeine and Acyclovir and related   Assessment / Plan:     Visit  Diagnoses: Rheumatoid arthritis involving multiple sites with positive rheumatoid factor (HCC) - Positive rheumatoid factor, positive anti-CCP, severe erosive disease with nodulosis and contractures: He has severe end-stage rheumatoid arthritis which is mostly burnt out.  He had no active synovitis.  He denies any increased joint swelling.  He continues to have joint stiffness.  He has difficulty doing objects due to swan-neck deformities in his fingers.  High risk medication use - Methotrexate 2.5 mg 6 tablets by mouth every 7 days and folic acid 1 mg 2 tablets daily.  History of total knee replacement, bilateral  Pain in both feet - X-rays of both feet from 05/07/2018 were consistent with severe erosive rheumatoid arthritis.  Labs from April 02, 2023 were stable with hemoglobin low at 11.0.  He was advised to get labs every 3 months.  Information on immunization was placed in the AVS.  He was advised to hold methotrexate if he develops an infection.  Chronic pain syndrome -he is off narcotics now.  He takes Tylenol on as needed basis.  Other medical problems are listed as follows:  Prediabetes  History of hyperlipidemia  History of melanoma - he is followed by dermatologist on a regular basis.  History of anemia  History of gastroesophageal reflux (GERD)  History of basal cell carcinoma - he had several lesions treated recently.  Stress-he had been under a lot of stress due to his wife's illness.  She finally recovered and is feeling better.  Orders: No orders of the defined types were placed in this encounter.  No orders of the defined types were placed in this encounter.    Follow-Up Instructions: Return in about 5 months (around 10/20/2023) for Rheumatoid arthritis, Osteoarthritis.   Pollyann Savoy, MD  Note - This record has been created using Animal nutritionist.  Chart creation errors have been sought, but may not always  have been located. Such creation errors do not  reflect on  the standard of medical care.

## 2023-05-18 ENCOUNTER — Ambulatory Visit (INDEPENDENT_AMBULATORY_CARE_PROVIDER_SITE_OTHER): Payer: PPO | Admitting: Family Medicine

## 2023-05-18 ENCOUNTER — Encounter: Payer: Self-pay | Admitting: Family Medicine

## 2023-05-18 VITALS — BP 118/72 | HR 52 | Temp 97.4°F | Wt 183.2 lb

## 2023-05-18 DIAGNOSIS — R3916 Straining to void: Secondary | ICD-10-CM

## 2023-05-18 DIAGNOSIS — M0579 Rheumatoid arthritis with rheumatoid factor of multiple sites without organ or systems involvement: Secondary | ICD-10-CM

## 2023-05-18 DIAGNOSIS — F324 Major depressive disorder, single episode, in partial remission: Secondary | ICD-10-CM | POA: Diagnosis not present

## 2023-05-18 DIAGNOSIS — N401 Enlarged prostate with lower urinary tract symptoms: Secondary | ICD-10-CM | POA: Diagnosis not present

## 2023-05-18 MED ORDER — FLUOXETINE HCL 20 MG PO CAPS
20.0000 mg | ORAL_CAPSULE | Freq: Every day | ORAL | 5 refills | Status: DC
Start: 2023-05-18 — End: 2024-03-07

## 2023-05-18 MED ORDER — FUROSEMIDE 20 MG PO TABS: ORAL_TABLET | ORAL | 1 refills | Status: DC

## 2023-05-18 MED ORDER — POTASSIUM CHLORIDE CRYS ER 20 MEQ PO TBCR: EXTENDED_RELEASE_TABLET | ORAL | 5 refills | Status: DC

## 2023-05-18 MED ORDER — TAMSULOSIN HCL 0.4 MG PO CAPS: ORAL_CAPSULE | ORAL | 5 refills | Status: DC

## 2023-05-18 MED ORDER — ATORVASTATIN CALCIUM 10 MG PO TABS: ORAL_TABLET | ORAL | 1 refills | Status: DC

## 2023-05-18 NOTE — Progress Notes (Signed)
Subjective:    Patient ID: Bryan Alderman Sr., male    DOB: 11-19-1940, 82 y.o.   MRN: [id number]  HPI Patient coming in for a 4 month follow up for Benign prostatic hyperplasia with straining on urination, [name] and Normochromic anemia. Patient will obtain Covid-19, RSV and High Dose Influenza vaccine with his local pharmacy [facility]. Patient has no further issues or concerns at the moment.   Review of Systems     Objective:   Physical Exam  General-in no acute distress Eyes-no discharge Lungs-respiratory rate normal, CTA CV-no murmurs,RRR Extremities skin warm dry no edema Neuro grossly normal Behavior normal, alert       Assessment & Plan:  1. Depression, major, single episode, in partial remission [facility]) Taking his medicine under a lot of stress taking care of his wife but tolerating things fairly well - FLUoxetine (PROZAC) 20 MG capsule; Take 1 capsule (20 mg total) by mouth daily.  Dispense: 30 capsule; Refill: 5  2. Benign prostatic hyperplasia (BPH) with straining on urination Continue Flomax  3. Rheumatoid arthritis involving multiple sites with positive rheumatoid factor (HCC) See specialist on a regular basis  Follow-up here approximately 5 months

## 2023-05-20 ENCOUNTER — Encounter: Payer: Self-pay | Admitting: Rheumatology

## 2023-05-20 ENCOUNTER — Ambulatory Visit: Payer: PPO | Attending: Rheumatology | Admitting: Rheumatology

## 2023-05-20 VITALS — BP 118/52 | HR 72 | Ht 68.0 in | Wt 179.8 lb

## 2023-05-20 DIAGNOSIS — F439 Reaction to severe stress, unspecified: Secondary | ICD-10-CM | POA: Diagnosis not present

## 2023-05-20 DIAGNOSIS — Z8639 Personal history of other endocrine, nutritional and metabolic disease: Secondary | ICD-10-CM | POA: Diagnosis not present

## 2023-05-20 DIAGNOSIS — M79671 Pain in right foot: Secondary | ICD-10-CM | POA: Diagnosis not present

## 2023-05-20 DIAGNOSIS — R7303 Prediabetes: Secondary | ICD-10-CM | POA: Diagnosis not present

## 2023-05-20 DIAGNOSIS — Z862 Personal history of diseases of the blood and blood-forming organs and certain disorders involving the immune mechanism: Secondary | ICD-10-CM

## 2023-05-20 DIAGNOSIS — Z8719 Personal history of other diseases of the digestive system: Secondary | ICD-10-CM

## 2023-05-20 DIAGNOSIS — M0579 Rheumatoid arthritis with rheumatoid factor of multiple sites without organ or systems involvement: Secondary | ICD-10-CM | POA: Diagnosis not present

## 2023-05-20 DIAGNOSIS — Z85828 Personal history of other malignant neoplasm of skin: Secondary | ICD-10-CM | POA: Diagnosis not present

## 2023-05-20 DIAGNOSIS — Z79899 Other long term (current) drug therapy: Secondary | ICD-10-CM

## 2023-05-20 DIAGNOSIS — Z85820 Personal history of malignant melanoma of skin: Secondary | ICD-10-CM | POA: Diagnosis not present

## 2023-05-20 DIAGNOSIS — Z96653 Presence of artificial knee joint, bilateral: Secondary | ICD-10-CM

## 2023-05-20 DIAGNOSIS — M79672 Pain in left foot: Secondary | ICD-10-CM

## 2023-05-20 DIAGNOSIS — G894 Chronic pain syndrome: Secondary | ICD-10-CM

## 2023-05-20 NOTE — Patient Instructions (Signed)
Standing Labs We placed an order today for your standing lab work.   Please have your standing labs drawn in November and every 3 months  Please have your labs drawn 2 weeks prior to your appointment so that the provider can discuss your lab results at your appointment, if possible.  Please note that you may see your imaging and lab results in MyChart before we have reviewed them. We will contact you once all results are reviewed. Please allow our office up to 72 hours to thoroughly review all of the results before contacting the office for clarification of your results.  WALK-IN LAB HOURS  Monday through Thursday from 8:00 am -12:30 pm and 1:00 pm-5:00 pm and Friday from 8:00 am-12:00 pm.  Patients with office visits requiring labs will be seen before walk-in labs.  You may encounter longer than normal wait times. Please allow additional time. Wait times may be shorter on  Monday and Thursday afternoons.  We do not book appointments for walk-in labs. We appreciate your patience and understanding with our staff.   Labs are drawn by [facility]. Please bring your co-pay at the time of your lab draw.  You may receive a bill from [facility] for your lab work.  Please note if you are on Hydroxychloroquine and and an order has been placed for a Hydroxychloroquine level,  you will need to have it drawn 4 hours or more after your last dose.  If you wish to have your labs drawn at another location, please call the office 24 hours in advance so we can fax the orders.  The office is located at [address], [place], [place] [address]   If you have any questions regarding directions or hours of operation,  please call [phone].   As a reminder, please drink plenty of water prior to coming for your lab work. Thanks!   Vaccines You are taking a medication(s) that can suppress your immune system.  The following immunizations are recommended: Flu annually Covid-19 RSV  Td/Tdap (tetanus,  diphtheria, pertussis) every 10 years Pneumonia (Prevnar 15 then Pneumovax 23 at least 1 year apart.  Alternatively, can take Prevnar 20 without needing additional dose) Shingrix: 2 doses from 4 weeks to 6 months apart  Please check with your PCP to make sure you are up to date.  If you have signs or symptoms of an infection or start antibiotics: First, call your PCP for workup of your infection. Hold your medication through the infection, until you complete your antibiotics, and until symptoms resolve if you take the following: Injectable medication (Actemra, Benlysta, Cimzia, Cosentyx, Enbrel, Humira, Kevzara, Orencia, Remicade, Simponi, Stelara, Taltz, Tremfya) Methotrexate Leflunomide (Arava) Mycophenolate (Cellcept) Harriette Ohara, Olumiant, or Rinvoq

## 2023-05-26 ENCOUNTER — Ambulatory Visit: Payer: PPO

## 2023-05-26 ENCOUNTER — Ambulatory Visit: Payer: Self-pay | Admitting: *Deleted

## 2023-05-26 DIAGNOSIS — Z23 Encounter for immunization: Secondary | ICD-10-CM

## 2023-05-26 NOTE — Patient Instructions (Signed)
 Visit Information  Thank you for taking time to visit with me today. Please don't hesitate to contact me if I can be of assistance to you.   Following are the goals we discussed today:   Goals Addressed             This Visit's Progress    Find Help in My Community   On track                           Follow Up Date 07/02/23    - follow-up on any referrals for help I am given - have a back-up plan - make a list of family or friends that I can call      Interventions Today    Flowsheet Row Most Recent Value  Chronic Disease   Chronic disease during today's visit Other  [rheumatoid arthritis]  General Interventions   General Interventions Discussed/Reviewed General Interventions Discussed, Community Resources  Exercise Interventions   Exercise Discussed/Reviewed Exercise Discussed, Physical Activity, Assistive device use and maintanence  [limited movement with rheumatoid arthritis in hands]  Physical Activity Discussed/Reviewed Physical Activity Discussed, Physical Activity Reviewed  Education Interventions   Education Provided Provided Education  [Care management/coordination services]  Provided Verbal Education On Nutrition, Exercise, Medication, Community Resources  Mental Health Interventions   Mental Health Discussed/Reviewed Mental Health Discussed, Coping Strategies, Other  [care giver to his wife who has mobility issues]  Nutrition Interventions   Nutrition Discussed/Reviewed Nutrition Discussed, Portion sizes, Fluid intake, Decreasing salt  Pharmacy Interventions   Pharmacy Dicussed/Reviewed Pharmacy Topics Discussed, Medications and their functions, Affording Medications  Safety Interventions   Safety Discussed/Reviewed Safety Discussed, Fall Risk, Home Safety  Home Safety Assistive Devices  Advanced Directive Interventions   Advanced Directives Discussed/Reviewed Advanced Directives Discussed, Advanced Care Planning  [has documents]              Our  next appointment is by telephone on 07/02/23 at 4:30  pm  Please call the care guide team at [phone] if you need to cancel or reschedule your appointment.   If you are experiencing a Mental Health or Behavioral Health Crisis or need someone to talk to, please call the Suicide and Crisis Lifeline: 988 call the [place] National Suicide Prevention Lifeline: 321 296 8274 or TTY: (812)126-8235 TTY (281)244-0899) to talk to a trained counselor call 1-800-273-TALK (toll free, 24 hour hotline) call the [facility]: (450)768-4079 call 911   The patient verbalized understanding of instructions, educational materials, and care plan provided today and DECLINED offer to receive copy of patient instructions, educational materials, and care plan.   The patient has been provided with contact information for the care management team and has been advised to call with any health related questions or concerns.   Nilson Tabora L. Noelle Penner, RN, BSN, CCM, Care Management Coordinator [phone]

## 2023-05-26 NOTE — Patient Outreach (Addendum)
  Care Coordination   Initial Visit Note   12/04/2023 updated entry for 05/26/23  Name: Bryan Furness Magos Sr. MRN: [id number] DOB: 01/13/1941  Bryan Limbo Karel Sr. is a 82 y.o. year old male who sees Luking, Jonna Coup, MD for primary care. I spoke with  Bryan Limbo Mabey Sr. In person at pcp office today.  What matters to the patients health and wellness today?  Food insecurity, vaccine appointments, Prescriptions  Food insecurity- They [place] food [place] [address] with assist of a friend name Bryan Maldonado. 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 He is the caregiver to his wife who has mobility issues Their son & daughter in law assists as needed - have 12 grandchildren He and his wife live in the basement of their son's home  Using the [employer] for medications Prescription concerns   Goals Addressed             This Visit's Progress    Find Help in My Community   On track                           Follow Up Date 07/02/23    - follow-up on any referrals for help I am given - have a back-up plan - make a list of family or friends that I can call      Interventions Today    Flowsheet Row Most Recent Value  Chronic Disease   Chronic disease during today's visit Other  [rheumatoid arthritis]  General Interventions   General Interventions Discussed/Reviewed General Interventions Discussed, Community Resources  Exercise Interventions   Exercise Discussed/Reviewed Exercise Discussed, Physical Activity, Assistive device use and maintanence  [limited movement with rheumatoid arthritis in hands]  Physical Activity Discussed/Reviewed Physical Activity Discussed, Physical Activity Reviewed  Education Interventions   Education Provided Provided Education  [Care management/coordination services]  Provided Verbal Education On Nutrition, Exercise, Medication, Community Resources  Mental Health Interventions   Mental Health Discussed/Reviewed Mental Health Discussed, Coping  Strategies, Other  [care giver to his wife who has mobility issues]  Nutrition Interventions   Nutrition Discussed/Reviewed Nutrition Discussed, Portion sizes, Fluid intake, Decreasing salt  Pharmacy Interventions   Pharmacy Dicussed/Reviewed Pharmacy Topics Discussed, Medications and their functions, Affording Medications  Safety Interventions   Safety Discussed/Reviewed Safety Discussed, Fall Risk, Home Safety  Home Safety Assistive Devices  Advanced Directive Interventions   Advanced Directives Discussed/Reviewed Advanced Directives Discussed, Advanced Care Planning  [has documents]              SDOH assessments and interventions completed:  Yes  SDOH Interventions Today    Flowsheet Row Most Recent Value  SDOH Interventions   Food Insecurity Interventions Intervention Not Indicated  Housing Interventions Intervention Not Indicated  Transportation Interventions Intervention Not Indicated  Utilities Interventions Intervention Not Indicated  Social Connections Interventions Intervention Not Indicated  Health Literacy Interventions Intervention Not Indicated        Care Coordination Interventions:  Yes, provided   Follow up plan: Follow up call scheduled for 07/02/23    Encounter Outcome:  Patient Visit Completed   Cala Bradford L. Noelle Penner, RN, BSN, CCM, Care Management Coordinator [phone]

## 2023-05-27 DIAGNOSIS — Z1283 Encounter for screening for malignant neoplasm of skin: Secondary | ICD-10-CM | POA: Diagnosis not present

## 2023-05-27 DIAGNOSIS — D485 Neoplasm of uncertain behavior of skin: Secondary | ICD-10-CM | POA: Diagnosis not present

## 2023-05-27 DIAGNOSIS — Z08 Encounter for follow-up examination after completed treatment for malignant neoplasm: Secondary | ICD-10-CM | POA: Diagnosis not present

## 2023-05-27 DIAGNOSIS — Z85820 Personal history of malignant melanoma of skin: Secondary | ICD-10-CM | POA: Diagnosis not present

## 2023-05-27 DIAGNOSIS — D225 Melanocytic nevi of trunk: Secondary | ICD-10-CM | POA: Diagnosis not present

## 2023-05-27 DIAGNOSIS — L570 Actinic keratosis: Secondary | ICD-10-CM | POA: Diagnosis not present

## 2023-05-27 DIAGNOSIS — X32XXXD Exposure to sunlight, subsequent encounter: Secondary | ICD-10-CM | POA: Diagnosis not present

## 2023-06-23 ENCOUNTER — Other Ambulatory Visit: Payer: Self-pay | Admitting: *Deleted

## 2023-06-23 MED ORDER — METHOTREXATE SODIUM 2.5 MG PO TABS: 15.0000 mg | ORAL_TABLET | ORAL | 0 refills | Status: DC

## 2023-06-23 NOTE — Telephone Encounter (Signed)
Last Fill: 03/31/2023  Labs: 04/02/2023 RBC 3.31, Hgb 11.0, Hct 33.7, MCV 101.8, Glucose 151, Total Bilirubin 1.5  Next Visit: 10/22/2023  Last Visit: 05/20/2023  DX: Rheumatoid arthritis involving multiple sites with positive rheumatoid factor   Current Dose per office note 05/20/2023: Methotrexate 2.5 mg 6 tablets by mouth every 7 days   Patient reminded he is due to update labs at the beginning of November   Okay to refill Methotrexate?

## 2023-06-23 NOTE — Telephone Encounter (Signed)
Patient contacted the office to request a medication refill.   1. Name of Medication: Methotrexate  2. How are you currently taking this medication (dosage and times per day)? 6 tabs once weekly.   3. What pharmacy would you like for that to be sent to? [facility]

## 2023-07-02 ENCOUNTER — Encounter: Payer: Self-pay | Admitting: *Deleted

## 2023-07-02 ENCOUNTER — Ambulatory Visit: Payer: Self-pay | Admitting: *Deleted

## 2023-07-02 NOTE — Patient Outreach (Signed)
  Care Coordination   Follow Up Visit Note   12/04/2023 updated noted for 07/02/23 Name: Bryan Nichols Hogge Sr. MRN: [id number] DOB: [birth year]  Bryan Limbo Mondesir Sr. is a 82 y.o. year old male who sees Luking, Jonna Coup, MD for primary care. I spoke with  Barnie Alderman Sr. by phone today.  What matters to the patients health and wellness today?  Vaccines    Goals Addressed             This Visit's Progress    COMPLETED: Find Help in My Community   On track                          Patient reports having multiple community resources to assist him. He continues to drive Denies other medical concerns   - follow-up on any referrals for help I am given - have a back-up plan - make a list of family or friends that I can call      Interventions Today    Flowsheet Row Most Recent Value  Chronic Disease   Chronic disease during today's visit Other  [rheumatoid arthitis memory]  General Interventions   General Interventions Discussed/Reviewed General Interventions Reviewed, Doctor Visits  Doctor Visits Discussed/Reviewed Doctor Visits Discussed, PCP, Specialist  PCP/Specialist Visits Compliance with follow-up visit  Mental Health Interventions   Mental Health Discussed/Reviewed Mental Health Reviewed, Coping Strategies  Pharmacy Interventions   Pharmacy Dicussed/Reviewed Pharmacy Topics Reviewed, Affording Medications  Safety Interventions   Safety Discussed/Reviewed Safety Reviewed, Fall Risk, Home Safety  Home Safety Assistive Devices              SDOH assessments and interventions completed:  No     Care Coordination Interventions:  Yes, provided   Follow up plan: No further intervention required.   Encounter Outcome:  Patient Visit Completed   Cala Bradford L. Noelle Penner, RN, BSN, CCM [facility] RN Care Manager [phone]

## 2023-08-13 ENCOUNTER — Other Ambulatory Visit: Payer: Self-pay | Admitting: *Deleted

## 2023-08-13 DIAGNOSIS — Z79899 Other long term (current) drug therapy: Secondary | ICD-10-CM

## 2023-08-14 LAB — CBC WITH DIFFERENTIAL/PLATELET
Absolute Lymphocytes: 3012 {cells}/uL (ref 850–3900)
Absolute Monocytes: 450 {cells}/uL (ref 200–950)
Basophils Absolute: 18 {cells}/uL (ref 0–200)
Basophils Relative: 0.3 %
Eosinophils Absolute: 78 {cells}/uL (ref 15–500)
Eosinophils Relative: 1.3 %
HCT: 34.8 % — ABNORMAL LOW (ref 38.5–50.0)
Hemoglobin: 11.6 g/dL — ABNORMAL LOW (ref 13.2–17.1)
MCH: 33.4 pg — ABNORMAL HIGH (ref 27.0–33.0)
MCHC: 33.3 g/dL (ref 32.0–36.0)
MCV: 100.3 fL — ABNORMAL HIGH (ref 80.0–100.0)
MPV: 10.1 fL (ref 7.5–12.5)
Monocytes Relative: 7.5 %
Neutro Abs: 2442 {cells}/uL (ref 1500–7800)
Neutrophils Relative %: 40.7 %
Platelets: 175 10*3/uL (ref 140–400)
RBC: 3.47 10*6/uL — ABNORMAL LOW (ref 4.20–5.80)
RDW: 13.1 % (ref 11.0–15.0)
Total Lymphocyte: 50.2 %
WBC: 6 10*3/uL (ref 3.8–10.8)

## 2023-08-14 LAB — COMPLETE METABOLIC PANEL WITH GFR
AG Ratio: 1.8 (calc) (ref 1.0–2.5)
ALT: 14 U/L (ref 9–46)
AST: 18 U/L (ref 10–35)
Albumin: 4.2 g/dL (ref 3.6–5.1)
Alkaline phosphatase (APISO): 71 U/L (ref 35–144)
BUN: 25 mg/dL (ref 7–25)
CO2: 32 mmol/L (ref 20–32)
Calcium: 9.6 mg/dL (ref 8.6–10.3)
Chloride: 102 mmol/L (ref 98–110)
Creat: 0.9 mg/dL (ref 0.70–1.22)
Globulin: 2.4 g/dL (ref 1.9–3.7)
Glucose, Bld: 108 mg/dL — ABNORMAL HIGH (ref 65–99)
Potassium: 4.8 mmol/L (ref 3.5–5.3)
Sodium: 141 mmol/L (ref 135–146)
Total Bilirubin: 1 mg/dL (ref 0.2–1.2)
Total Protein: 6.6 g/dL (ref 6.1–8.1)
eGFR: 85 mL/min/{1.73_m2} (ref >=60)

## 2023-08-14 NOTE — Progress Notes (Signed)
CBC and CMP are stable.  Hemoglobin is low and stable.  Please forward results to his PCP.

## 2023-09-03 DIAGNOSIS — B351 Tinea unguium: Secondary | ICD-10-CM | POA: Diagnosis not present

## 2023-09-10 ENCOUNTER — Ambulatory Visit (INDEPENDENT_AMBULATORY_CARE_PROVIDER_SITE_OTHER): Payer: PPO | Admitting: Family Medicine

## 2023-09-10 VITALS — BP 126/69 | HR 55 | Temp 97.9°F | Ht 68.0 in | Wt 181.0 lb

## 2023-09-10 DIAGNOSIS — D509 Iron deficiency anemia, unspecified: Secondary | ICD-10-CM | POA: Diagnosis not present

## 2023-09-10 DIAGNOSIS — Z23 Encounter for immunization: Secondary | ICD-10-CM | POA: Diagnosis not present

## 2023-09-10 DIAGNOSIS — R3916 Straining to void: Secondary | ICD-10-CM | POA: Diagnosis not present

## 2023-09-10 DIAGNOSIS — R7303 Prediabetes: Secondary | ICD-10-CM | POA: Diagnosis not present

## 2023-09-10 DIAGNOSIS — E782 Mixed hyperlipidemia: Secondary | ICD-10-CM | POA: Diagnosis not present

## 2023-09-10 DIAGNOSIS — D649 Anemia, unspecified: Secondary | ICD-10-CM

## 2023-09-10 DIAGNOSIS — R739 Hyperglycemia, unspecified: Secondary | ICD-10-CM

## 2023-09-10 DIAGNOSIS — Z0001 Encounter for general adult medical examination with abnormal findings: Secondary | ICD-10-CM | POA: Diagnosis not present

## 2023-09-10 DIAGNOSIS — N401 Enlarged prostate with lower urinary tract symptoms: Secondary | ICD-10-CM | POA: Diagnosis not present

## 2023-09-10 DIAGNOSIS — M0579 Rheumatoid arthritis with rheumatoid factor of multiple sites without organ or systems involvement: Secondary | ICD-10-CM

## 2023-09-10 DIAGNOSIS — Z0000 Encounter for general adult medical examination without abnormal findings: Secondary | ICD-10-CM
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
## 2023-09-11 LAB — LIPID PANEL
Chol/HDL Ratio: 2.5 {ratio} (ref 0.0–5.0)
Cholesterol, Total: 174 mg/dL (ref 100–199)
HDL: 69 mg/dL (ref >39)
LDL Chol Calc (NIH): 77 mg/dL (ref 0–99)
Triglycerides: 167 mg/dL — ABNORMAL HIGH (ref 0–149)
VLDL Cholesterol Cal: 28 mg/dL (ref 5–40)

## 2023-09-13 ENCOUNTER — Encounter: Payer: Self-pay | Admitting: Family Medicine

## 2023-09-13 NOTE — Progress Notes (Signed)
Please mail to patient thank you ?

## 2023-09-14 NOTE — Addendum Note (Signed)
 Addended by: Alm Bustard R on: 09/14/2023 02:28 PM   Modules accepted: Orders

## 2023-09-16 ENCOUNTER — Other Ambulatory Visit: Payer: Self-pay | Admitting: Rheumatology

## 2023-09-17 NOTE — Telephone Encounter (Signed)
Last Fill: 06/23/2023  Labs: 08/13/2023 CBC and CMP are stable.  Hemoglobin is low and stable.   Next Visit: 10/22/2023  Last Visit: 05/20/2023  DX: Rheumatoid arthritis involving multiple sites with positive rheumatoid factor   Current Dose per office note 05/20/2023: Methotrexate 2.5 mg 6 tablets by mouth every 7 days   Okay to refill Methotrexate?

## 2023-09-24 ENCOUNTER — Other Ambulatory Visit: Payer: Self-pay | Admitting: Family Medicine

## 2023-09-24 DIAGNOSIS — K219 Gastro-esophageal reflux disease without esophagitis: Secondary | ICD-10-CM

## 2023-09-30 ENCOUNTER — Encounter (HOSPITAL_COMMUNITY): Payer: Self-pay

## 2023-09-30 ENCOUNTER — Emergency Department (HOSPITAL_COMMUNITY): Payer: PPO

## 2023-09-30 ENCOUNTER — Ambulatory Visit: Payer: Self-pay | Admitting: Family Medicine

## 2023-09-30 ENCOUNTER — Other Ambulatory Visit: Payer: Self-pay

## 2023-09-30 ENCOUNTER — Emergency Department (HOSPITAL_COMMUNITY)
Admission: EM | Admit: 2023-09-30 | Discharge: 2023-10-01 | Disposition: A | Discharge status: Home / Self Care | Payer: PPO | Attending: Emergency Medicine | Admitting: Emergency Medicine

## 2023-09-30 DIAGNOSIS — R531 Weakness: Secondary | ICD-10-CM | POA: Insufficient documentation

## 2023-09-30 DIAGNOSIS — B338 Other specified viral diseases: Secondary | ICD-10-CM | POA: Insufficient documentation

## 2023-09-30 DIAGNOSIS — M069 Rheumatoid arthritis, unspecified: Secondary | ICD-10-CM | POA: Insufficient documentation

## 2023-09-30 DIAGNOSIS — I700 Atherosclerosis of aorta: Secondary | ICD-10-CM | POA: Insufficient documentation

## 2023-09-30 DIAGNOSIS — R509 Fever, unspecified: Secondary | ICD-10-CM | POA: Diagnosis not present

## 2023-09-30 DIAGNOSIS — R059 Cough, unspecified: Secondary | ICD-10-CM | POA: Diagnosis not present

## 2023-09-30 DIAGNOSIS — Z1152 Encounter for screening for COVID-19: Secondary | ICD-10-CM | POA: Insufficient documentation

## 2023-09-30 DIAGNOSIS — B974 Respiratory syncytial virus as the cause of diseases classified elsewhere: Secondary | ICD-10-CM | POA: Insufficient documentation

## 2023-09-30 LAB — RESP PANEL BY RT-PCR (RSV, FLU A&B, COVID)  RVPGX2
Influenza A by PCR: NEGATIVE
Influenza B by PCR: NEGATIVE
Resp Syncytial Virus by PCR: POSITIVE — AB
SARS Coronavirus 2 by RT PCR: NEGATIVE

## 2023-09-30 NOTE — ED Triage Notes (Addendum)
Pt arrives with daughter in law. Complains of cough, congestion, & cold like symptoms x1 week. Denies fevers. Has been taking mucinex & Nyquil at home. VSS. Afebrile.

## 2023-09-30 NOTE — Telephone Encounter (Signed)
Copied from CRM [id number]. Topic: Clinical - Red Word Triage >> Sep 30, 2023 11:06 AM Geroge Baseman wrote: Red Word that prompted transfer to Nurse Triage: Patient having cold symptoms but is experiencing seeing red when he blinks and closes his eye.   Chief Complaint: cold symptoms and visual changes Symptoms: Productive cough, congestion, sore throat, sinus pain, blurred vision, seeing spots, eye itching, eye discharge, occasional dizziness and headaches Pertinent Negatives: Patient denies fever Disposition: [x] ED /[] Urgent Care (no appt availability in office) / [] Appointment(In office/virtual)/ []  [facility]/ [] Home Care/ [] Refused Recommended Disposition /[] [facility]/ []  Follow-up with PCP Additional Notes: Patient stated that he is having cold symptoms. He has a productive cough, congestion, sore throat, sinus pain.  Mucinex and Tylenol not helping. He also has been experiencing visual disturbance of both eyes for about 2 weeks. He stated that he has blurred vision, seeing red spots, eye itching, yellow eye discharge, eye pain. He feels like he has sandpaper in his eyelids. He stated the symptoms come and go, but he feels like it is worsening. He currently has blurred vision now. He has also been experiencing intermittent headaches and dizziness as well. Recommended for patient to be evaluated at ED. Patient wanted to make appointment but there is no availability today so patient agreed to go to ED. Patient has someone with him that will take him there.    Reason for Disposition  [1] Blurred vision AND [2] new or worsening  Answer Assessment - Initial Assessment Questions 1. COUGH: "Do you have a cough?" If Yes, ask: "Describe the color of your sputum" (clear, white, yellow, green)     Yes, Green  2. RESPIRATORY DISTRESS: "Describe your breathing."      Hard to breath through nose due to stuffy nose  3. FEVER: "Do you have a fever?" If Yes, ask: "What is your  temperature, how was it measured, and when did it start?"     No  4. OTHER SYMPTOMS: "Do you have any other symptoms?" (e.g., sore throat, earache, wheezing, vomiting)    Sinus pain, sore throat  Answer Assessment - Initial Assessment Questions 1. ONSET: "When did the pain start?" (e.g., minutes, hours, days)     2 weeks ago   2. TIMING: "Does the pain come and go, or has it been constant since it started?" (e.g., constant, intermittent, fleeting)     Comes and goes  3. SEVERITY: "How bad is the pain?"   (Scale 1-10; mild, moderate or severe)   - MILD (1-3): doesn't interfere with normal activities    - MODERATE (4-7): interferes with normal activities or awakens from sleep    - SEVERE (8-10): excruciating pain and patient unable to do normal activities     Patient stated a little bit of pain, then stated the pain level ranges from 7-10.   4. LOCATION: "Where does it hurt?"  (e.g., eyelid, eye, cheekbone)     Both eyes  5. VISION: "Do you have blurred vision or changes in your vision?"      Blurred vision on and off. Seeing red spots every day and sometimes it lasts all day  6. EYE DISCHARGE: "Is there any discharge (pus) from the eye(s)?"  If Yes, ask: "What color is it?"      Yellow discharge from both eyes  7. FEVER: "Do you have a fever?" If Yes, ask: "What is it, how was it measured, and when did it start?"  No  8. OTHER SYMPTOMS: "Do you have any other symptoms?" (e.g., headache, nasal discharge, facial rash)     Blurred vision, eyes itching, sometimes have mild dizziness and occasional headache. Not dizzy now  Protocols used: Common Cold-A-AH, Eye Pain and Other Symptoms-A-AH

## 2023-09-30 NOTE — Telephone Encounter (Signed)
Agree with the triage If for some reason he does call back today or tomorrow we have a same-day he may be seen

## 2023-09-30 NOTE — ED Notes (Signed)
Pt ambulatory to triage with daughter- informed pt that he was RSV positive- informed pt that vitals are normal. Daughter asking there is medicine like antibiotic for RSV- informed that it is a virus, and there is no "cure" it has to run its course, just like the common cold, we would just treat the pt symptoms with medication for cough, congestion, fever and body aches like tylenol and ibuprofen.

## 2023-10-01 NOTE — ED Provider Notes (Addendum)
AP-EMERGENCY DEPT [facility] Emergency Department Provider Note MRN:  [id number]  Arrival date & time: 10/01/23     Chief Complaint   Cough and Weakness   History of Present Illness   Bryan BURMESTER Sr. is a 83 y.o. year-old male with a history of RA presenting to the ED with chief complaint of cough and weakness.  Persistent cough and weakness for the past week.  Has tested positive for RSV.  Denies chest pain or shortness of breath, no abdominal pain, no fever.  Endorsing low energy, malaise.  No new leg pain or swelling.  Review of Systems  A thorough review of systems was obtained and all systems are negative except as noted in the HPI and PMH.   Patient's Health History    Past Medical History:  Diagnosis Date   Anemia    Basal cell carcinoma    BPH (benign prostatic hyperplasia)    GERD (gastroesophageal reflux disease)    Melanoma (HCC)    Osteoarthritis    Rheumatoid aortitis    Rheumatoid arthritis [facility])     Past Surgical History:  Procedure Laterality Date   CATARACT EXTRACTION W/PHACO Right 03/12/2020   Procedure: CATARACT EXTRACTION PHACO AND INTRAOCULAR LENS PLACEMENT RIGHT EYE;  Surgeon: Fabio Pierce, MD;  Location: [facility] ORS;  Service: Ophthalmology;  Laterality: Right;  CDE: 9.99   CATARACT EXTRACTION W/PHACO Left 03/26/2020   Procedure: CATARACT EXTRACTION PHACO AND INTRAOCULAR LENS PLACEMENT (IOC);  Surgeon: Fabio Pierce, MD;  Location: [facility] ORS;  Service: Ophthalmology;  Laterality: Left;  CDE: 9.16    COLONOSCOPY     COLONOSCOPY WITH PROPOFOL N/A 12/22/2022   Procedure: COLONOSCOPY WITH PROPOFOL;  Surgeon: Lanelle Bal, DO;  Location: [facility] ENDO SUITE;  Service: Endoscopy;  Laterality: N/A;  10:15AM;ASA 3   ESOPHAGEAL DILATION     HERNIA REPAIR     JOINT REPLACEMENT Bilateral    KNEE ARTHROPLASTY     MELANOMA EXCISION     MELANOMA EXCISION  09/22/2018   on head   POLYPECTOMY  12/22/2022   Procedure: POLYPECTOMY;  Surgeon: Lanelle Bal,  DO;  Location: [facility] ENDO SUITE;  Service: Endoscopy;;    Family History  Problem Relation Age of Onset   Rheum arthritis Mother    Emphysema Father    Cancer Brother    Emphysema Sister     Social History   Socioeconomic History   Marital status: Married    Spouse name: Not on file   Number of children: Not on file   Years of education: Not on file   Highest education level: Not on file  Occupational History   Not on file  Tobacco Use   Smoking status: Never    Passive exposure: Current   Smokeless tobacco: Never  Vaping Use   Vaping status: Never Used  Substance and Sexual Activity   Alcohol use: No   Drug use: No   Sexual activity: Not on file  Other Topics Concern   Not on file  Social History Narrative   Not on file   Social Drivers of Health   Financial Resource Strain: Low Risk  (01/08/2021)   Overall Financial Resource Strain (CARDIA)    Difficulty of Paying Living Expenses: Not hard at all  Food Insecurity: No Food Insecurity (01/08/2021)   Hunger Vital Sign    Worried About Running Out of Food in the Last Year: Never true    Ran Out of Food in the Last Year: Never true  Transportation Needs: No Transportation Needs (01/08/2021)   PRAPARE - Administrator, Civil Service (Medical): No    Lack of Transportation (Non-Medical): No  Physical Activity: Inactive (01/08/2021)   Exercise Vital Sign    Days of Exercise per Week: 0 days    Minutes of Exercise per Session: 0 min  Stress: No Stress Concern Present (01/08/2021)   [employer] of Occupational Health - Occupational Stress Questionnaire    Feeling of Stress : Not at all  Social Connections: Moderately Integrated (01/08/2021)   Social Connection and Isolation Panel [NHANES]    Frequency of Communication with Friends and Family: Not on file    Frequency of Social Gatherings with Friends and Family: More than three times a week    Attends Religious Services: More than 4 times per year    Active  Member of [employer] or Organizations: No    Attends Banker Meetings: Never    Marital Status: Married  Catering manager Violence: Not At Risk (01/08/2021)   Humiliation, Afraid, Rape, and Kick questionnaire    Fear of Current or Ex-Partner: No    Emotionally Abused: No    Physically Abused: No    Sexually Abused: No     Physical Exam   Vitals:   10/01/23 0000 10/01/23 0030  BP: (!) 144/61 (!) 148/77  Pulse: (!) 51 (!) 50  Resp: 18 18  Temp:    SpO2: 92% 92%    CONSTITUTIONAL: Chronically ill-appearing, NAD NEURO/PSYCH:  Alert and oriented x 3, no focal deficits EYES:  eyes equal and reactive ENT/NECK:  no LAD, no JVD CARDIO: Regular rate, well-perfused, normal S1 and S2 PULM:  CTAB no wheezing or rhonchi GI/GU:  non-distended, non-tender MSK/SPINE:  No gross deformities, no edema SKIN:  no rash, atraumatic   *Additional and/or pertinent findings included in MDM below  Diagnostic and Interventional Summary    EKG Interpretation Date/Time:    Ventricular Rate:    PR Interval:    QRS Duration:    QT Interval:    QTC Calculation:   R Axis:      Text Interpretation:         Labs Reviewed  RESP PANEL BY RT-PCR (RSV, FLU A&B, COVID)  RVPGX2 - Abnormal; Notable for the following components:      Result Value   Resp Syncytial Virus by PCR POSITIVE (*)    All other components within normal limits    DG Chest Port 1 View  Final Result      Medications - No data to display   Procedures  /  Critical Care Procedures  ED Course and Medical Decision Making  Initial Impression and Ddx Symptoms seem well explained by RSV.  Patient is in no acute distress with reassuring vital signs.  Oxygen saturation is hovering between 91 and 95%, will obtain screening x-ray to evaluate for any obvious lobar pneumonia that would warrant treatment with antibiotics.  Past medical/surgical history that increases complexity of ED encounter: RA  Interpretation of  Diagnostics I personally reviewed the chest x-ray and my interpretation is as follows: No lobar opacity or pneumothorax    Patient Reassessment and Ultimate Disposition/Management     Discharge  Patient management required discussion with the following services or consulting groups:  None  Complexity of Problems Addressed Acute illness or injury that poses threat of life of bodily function  Additional Data Reviewed and Analyzed Further history obtained from: Further history from spouse/family member  Additional Factors  Impacting ED Encounter Risk None  Elmer Sow. [name], MD [facility] Emergency Medicine [facility] [email or web address]  Final Clinical Impressions(s) / ED Diagnoses     ICD-10-CM   1. RSV infection  B33.8       ED Discharge Orders     None        Discharge Instructions Discussed with and Provided to Patient:     Discharge Instructions      You were evaluated in the Emergency Department and after careful evaluation, we did not find any emergent condition requiring admission or further testing in the hospital.  Your exam/testing today is overall reassuring.  X-ray did not show any pneumonia.  Recommend continued rest, Tylenol for discomfort, plenty of fluids.  Symptoms seem well explained by RSV infection.  Please return to the Emergency Department if you experience any worsening of your condition.   Thank you for allowing [place] to be a part of your care.       Sabas Sous, MD 10/01/23 Bryan Maldonado    Sabas Sous, MD 10/01/23 Bryan Maldonado

## 2023-10-01 NOTE — Discharge Instructions (Signed)
You were evaluated in the Emergency Department and after careful evaluation, we did not find any emergent condition requiring admission or further testing in the hospital.  Your exam/testing today is overall reassuring.  X-ray did not show any pneumonia.  Recommend continued rest, Tylenol for discomfort, plenty of fluids.  Symptoms seem well explained by RSV infection.  Please return to the Emergency Department if you experience any worsening of your condition.   Thank you for allowing [place] to be a part of your care.

## 2023-10-01 NOTE — ED Notes (Signed)
Pt ambulated to the BR with standby assistance

## 2023-10-05 ENCOUNTER — Ambulatory Visit (INDEPENDENT_AMBULATORY_CARE_PROVIDER_SITE_OTHER): Payer: PPO | Admitting: Family Medicine

## 2023-10-05 VITALS — BP 118/60 | HR 79 | Temp 97.3°F | Ht 63.0 in | Wt 184.0 lb

## 2023-10-05 DIAGNOSIS — J019 Acute sinusitis, unspecified: Secondary | ICD-10-CM

## 2023-10-05 MED ORDER — AMOXICILLIN 500 MG PO TABS: 500.0000 mg | ORAL_TABLET | Freq: Three times a day (TID) | ORAL | 0 refills | Status: DC

## 2023-10-05 NOTE — Progress Notes (Signed)
   Subjective:    Patient ID: Bryan Alderman Sr., male    DOB: 07/21/1941, 83 y.o.   MRN: [id number]  HPI  Patient presents today with respiratory illness Number of days present-7 days  Symptoms include-head congestion sinus pressure drainage coughing denies wheezing difficulty breathing  Presence of worrisome signs (severe shortness of breath, lethargy, etc.) -none  Recent/current visit to urgent care or ER-was seen in the ER  Recent direct exposure to Covid-positive for RSV  Any current Covid testing-negative for COVID  Lungs are doing improved according to patient  Review of Systems     Objective:   Physical Exam  Gen-NAD not toxic TMS-normal bilateral T- normal no redness Chest-CTA respiratory rate normal no crackles CV RRR no murmur Skin-warm dry Neuro-grossly normal       Assessment & Plan:  Acute rhinosinusitis Amoxicillin 3 times daily 7 days Warning signs regarding pneumonia discussed Follow-up if problems RSV cough may take several weeks to go away

## 2023-10-08 NOTE — Progress Notes (Signed)
Office Visit Note  Patient: Bryan Hink Kimm Sr.             Date of Birth: 05/15/1941           MRN: [id number]             PCP: Babs Sciara, MD Referring: Babs Sciara, MD Visit Date: 10/22/2023 Occupation: @GUAROCC @  Subjective:  Medication management  History of Present Illness: Bryan KINTZ Sr. is a 83 y.o. male with seropositive rheumatoid arthritis and osteoarthritis.  He states he continues to have some stiffness and discomfort in his joints.  He has not noticed any increased joint pain.  He states that he takes his time to get ready and do activities.  He has been taking methotrexate 6 tablets p.o. weekly along with folic acid 2 mg p.o. daily.  Patient states that he lost his wife few months back.  His daughter-in-law comes sometimes and helps him in the house.  He has been going through the grieving process.    Activities of Daily Living:  Patient reports morning stiffness for 15  minutes.   Patient Reports nocturnal pain.  Difficulty dressing/grooming: Reports Difficulty climbing stairs: Reports Difficulty getting out of chair: Reports Difficulty using hands for taps, buttons, cutlery, and/or writing: Reports  Review of Systems  Constitutional:  Positive for fatigue.  HENT:  Positive for mouth dryness.   Eyes:  Positive for dryness.  Respiratory:  Negative for shortness of breath.   Cardiovascular:  Negative for chest pain and palpitations.  Gastrointestinal:  Positive for constipation. Negative for blood in stool and diarrhea.  Endocrine: Negative for increased urination.  Genitourinary:  Negative for involuntary urination.  Musculoskeletal:  Positive for joint pain, gait problem, joint pain and morning stiffness. Negative for myalgias and myalgias.  Skin:  Negative for color change, rash and sensitivity to sunlight.  Allergic/Immunologic: Negative for susceptible to infections.  Neurological:  Negative for dizziness and headaches.  Hematological:  Negative  for swollen glands.  Psychiatric/Behavioral:  Negative for depressed mood and sleep disturbance. The patient is not nervous/anxious.     PMFS History:  Patient Active Problem List   Diagnosis Date Noted   [name] (HCC) 01/29/2021   Anemia, chronic disease 06/25/2020   High risk medication use 09/19/2016   History of total knee replacement, bilateral 09/19/2016   Gastroesophageal reflux disease without esophagitis 09/19/2016   History of melanoma 09/19/2016   History of basal cell carcinoma 09/19/2016   History of anemia 09/19/2016   Allergic rhinitis 02/18/2016   Prediabetes 11/20/2014   Hyperlipidemia 11/20/2014   BPH (benign prostatic hyperplasia) 05/22/2014   Rheumatoid arthritis involving multiple sites with positive rheumatoid factor (HCC) 01/05/2013   Chronic pain syndrome 01/05/2013    Past Medical History:  Diagnosis Date   Anemia    Basal cell carcinoma    BPH (benign prostatic hyperplasia)    GERD (gastroesophageal reflux disease)    Melanoma (HCC)    Osteoarthritis    Rheumatoid aortitis    Rheumatoid arthritis (HCC)     Family History  Problem Relation Age of Onset   Rheum arthritis Mother    Emphysema Father    Cancer Brother    Emphysema Sister    Past Surgical History:  Procedure Laterality Date   CATARACT EXTRACTION W/PHACO Right 03/12/2020   Procedure: CATARACT EXTRACTION PHACO AND INTRAOCULAR LENS PLACEMENT RIGHT EYE;  Surgeon: Fabio Pierce, MD;  Location: [facility] ORS;  Service: Ophthalmology;  Laterality: Right;  CDE:  9.99   CATARACT EXTRACTION W/PHACO Left 03/26/2020   Procedure: CATARACT EXTRACTION PHACO AND INTRAOCULAR LENS PLACEMENT (IOC);  Surgeon: Fabio Pierce, MD;  Location: [facility] ORS;  Service: Ophthalmology;  Laterality: Left;  CDE: 9.16    COLONOSCOPY     COLONOSCOPY WITH PROPOFOL N/A 12/22/2022   Procedure: COLONOSCOPY WITH PROPOFOL;  Surgeon: Lanelle Bal, DO;  Location: [facility] ENDO SUITE;  Service: Endoscopy;  Laterality: N/A;   10:15AM;ASA 3   ESOPHAGEAL DILATION     HERNIA REPAIR     JOINT REPLACEMENT Bilateral    KNEE ARTHROPLASTY     MELANOMA EXCISION     MELANOMA EXCISION  09/22/2018   on head   POLYPECTOMY  12/22/2022   Procedure: POLYPECTOMY;  Surgeon: Lanelle Bal, DO;  Location: [facility] ENDO SUITE;  Service: Endoscopy;;   Social History   Social History Narrative   Not on file   Immunization History  Administered Date(s) Administered   Fluad Quad(high Dose 65+) 06/25/2020, 06/06/2021, 06/30/2022   Fluad Trivalent(High Dose 65+) 05/26/2023   Influenza Split 05/19/2013   Influenza, High Dose Seasonal PF 05/20/2019   Influenza,inj,Quad PF,6+ Mos 05/22/2014, 05/22/2015, 06/06/2016, 06/10/2017, 05/14/2018   Influenza-Unspecified 05/20/2019   PFIZER(Purple Top)SARS-COV-2 Vaccination 11/14/2019, 12/05/2019   PNEUMOCOCCAL CONJUGATE-20 09/10/2023   Pneumococcal Conjugate-13 05/22/2014   Pneumococcal Polysaccharide-23 05/19/2013   Zoster Recombinant(Shingrix) 09/06/2018, 03/11/2019     Objective: Vital Signs: BP 104/68 (BP Location: Left Arm, Patient Position: Sitting, Cuff Size: Large)   Pulse 73   Resp 12   Ht 5\' 4"  (1.626 m)   Wt 185 lb (83.9 kg)   BMI 31.76 kg/m    Physical Exam Vitals and nursing note reviewed.  Constitutional:      Appearance: He is well-developed.  HENT:     Head: Normocephalic and atraumatic.  Eyes:     Conjunctiva/sclera: Conjunctivae normal.     Pupils: Pupils are equal, round, and reactive to light.  Cardiovascular:     Rate and Rhythm: Normal rate and regular rhythm.     Heart sounds: Normal heart sounds.  Pulmonary:     Effort: Pulmonary effort is normal.     Breath sounds: Normal breath sounds.  Abdominal:     General: Bowel sounds are normal.     Palpations: Abdomen is soft.  Musculoskeletal:     Cervical back: Normal range of motion and neck supple.  Skin:    General: Skin is warm and dry.     Capillary Refill: Capillary refill takes less than 2  seconds.  Neurological:     Mental Status: He is alert and oriented to person, place, and time.  Psychiatric:        Behavior: Behavior normal.      Musculoskeletal Exam: Patient was examined in the seated position.  He had limited lateral rotation and flexion and extension of the cervical spine.  Thoracic kyphosis was noted.  He had no tenderness over thoracic or lumbar spine.  Shoulder joint abduction and forward flexion was limited to 90 degrees with limited internal rotation.  Bilateral elbow joint contractures with no synovitis was noted.  He had limited range of motion of bilateral wrist joints with no synovitis.  Synovial thickening was noted over bilateral MCP joints.  Swan-neck deformities were noted in the PIP joints.  No synovitis was noted.  Hip joints could not be assessed in the seated position.  Knee joints were replaced and had limited extension without any warmth swelling or effusion.  There was no  tenderness over ankles or MTPs.  Bilateral lower extremity edema was noted.  CDAI Exam: CDAI Score: -- Patient Global: --; Provider Global: -- Swollen: --; Tender: -- Joint Exam 10/22/2023   No joint exam has been documented for this visit   There is currently no information documented on the homunculus. Go to the Rheumatology activity and complete the homunculus joint exam.  Investigation: No additional findings.  Imaging: DG Chest Port 1 View Result Date: 10/01/2023 CLINICAL DATA:  cough EXAM: PORTABLE CHEST 1 VIEW COMPARISON:  Chest x-ray 04/02/2023 FINDINGS: The heart and mediastinal contours are unchanged. Atherosclerotic plaque. No focal consolidation. Chronic coarsened interstitial markings with no overt pulmonary edema. No pleural effusion. No pneumothorax. No acute osseous abnormality. IMPRESSION: 1. No active disease. 2. Aortic Atherosclerosis (ICD10-I70.0). Electronically Signed   By: Tish Frederickson M.D.   On: 10/01/2023 01:03    Recent Labs: Lab Results   Component Value Date   WBC 6.0 08/13/2023   HGB 11.6 (L) 08/13/2023   PLT 175 08/13/2023   NA 141 08/13/2023   K 4.8 08/13/2023   CL 102 08/13/2023   CO2 32 08/13/2023   GLUCOSE 108 (H) 08/13/2023   BUN 25 08/13/2023   CREATININE 0.90 08/13/2023   BILITOT 1.0 08/13/2023   ALKPHOS 69 04/02/2023   AST 18 08/13/2023   ALT 14 08/13/2023   PROT 6.6 08/13/2023   ALBUMIN 4.0 04/02/2023   CALCIUM 9.6 08/13/2023   GFRAA 96 01/14/2021    Speciality Comments: No specialty comments available.  Procedures:  No procedures performed Allergies: Morphine and codeine and Acyclovir and related   Assessment / Plan:     Visit Diagnoses: Rheumatoid arthritis involving multiple sites with positive rheumatoid factor (HCC) - Positive rheumatoid factor, positive anti-CCP, severe erosive disease with nodulosis and contractures: Patient continues to do well on methotrexate 6 tablets p.o. weekly which she has been taking without any interruption.  He denies any increased joint swelling.  He has some stiffness in the morning which gets better after a few minutes.  High risk medication use - Methotrexate 2.5 mg 6 tablets by mouth every 7 days and folic acid 1 mg 2 tablets daily.  August 13, 2023 CBC and CMP were normal.  Hemoglobin remains low at 11.6.  Patient was advised to get repeat labs in March.  Information immunization was placed in the AVS.  He was advised to hold methotrexate if he develops an infection resume after the infection resolves.  History of total knee replacement, bilateral-doing well with limited range of motion.  Pain in both feet -he gets some stiffness in the morning which gets better.  X-rays of both feet from 05/07/2018 were consistent with severe erosive rheumatoid arthritis.  Chronic pain syndrome - he is off narcotics now.  He takes Tylenol on as needed basis.  Prediabetes  History of hyperlipidemia-September 10, 2023 lipid panel showed hypertriglyceridemia.  History of  melanoma - he is followed by dermatologist on a regular basis.  History of anemia  History of gastroesophageal reflux (GERD)  History of basal cell carcinoma - he had several lesions treated recently.  Grieving-patient lost his wife few months back and going through the grieving process.  His daughter-in-law has been helping him intermittently.  Orders: No orders of the defined types were placed in this encounter.  No orders of the defined types were placed in this encounter.    Follow-Up Instructions: Return in about 5 months (around 03/20/2024) for Rheumatoid arthritis, Osteoarthritis.   Pollyann Savoy,  MD  Note - This record has been created using Animal nutritionist.  Chart creation errors have been sought, but may not always  have been located. Such creation errors do not reflect on  the standard of medical care.

## 2023-10-16 NOTE — Progress Notes (Signed)
Bryan Maldonado. [name], MD [facility] Emergency Medicine [facility] [email or web address]

## 2023-10-19 ENCOUNTER — Ambulatory Visit: Payer: PPO | Admitting: Family Medicine

## 2023-10-21 ENCOUNTER — Ambulatory Visit: Payer: PPO | Admitting: Family Medicine

## 2023-10-22 ENCOUNTER — Encounter: Payer: Self-pay | Admitting: Rheumatology

## 2023-10-22 ENCOUNTER — Ambulatory Visit: Payer: PPO | Attending: Rheumatology | Admitting: Rheumatology

## 2023-10-22 VITALS — BP 104/68 | HR 73 | Resp 12 | Ht 64.0 in | Wt 185.0 lb

## 2023-10-22 DIAGNOSIS — M0579 Rheumatoid arthritis with rheumatoid factor of multiple sites without organ or systems involvement: Secondary | ICD-10-CM | POA: Diagnosis not present

## 2023-10-22 DIAGNOSIS — Z862 Personal history of diseases of the blood and blood-forming organs and certain disorders involving the immune mechanism: Secondary | ICD-10-CM | POA: Diagnosis not present

## 2023-10-22 DIAGNOSIS — Z85828 Personal history of other malignant neoplasm of skin: Secondary | ICD-10-CM | POA: Diagnosis not present

## 2023-10-22 DIAGNOSIS — G894 Chronic pain syndrome: Secondary | ICD-10-CM

## 2023-10-22 DIAGNOSIS — Z8639 Personal history of other endocrine, nutritional and metabolic disease: Secondary | ICD-10-CM

## 2023-10-22 DIAGNOSIS — M79672 Pain in left foot: Secondary | ICD-10-CM

## 2023-10-22 DIAGNOSIS — M79671 Pain in right foot: Secondary | ICD-10-CM

## 2023-10-22 DIAGNOSIS — Z8719 Personal history of other diseases of the digestive system: Secondary | ICD-10-CM

## 2023-10-22 DIAGNOSIS — Z96653 Presence of artificial knee joint, bilateral: Secondary | ICD-10-CM | POA: Diagnosis not present

## 2023-10-22 DIAGNOSIS — F4321 Adjustment disorder with depressed mood: Secondary | ICD-10-CM

## 2023-10-22 DIAGNOSIS — Z79899 Other long term (current) drug therapy: Secondary | ICD-10-CM | POA: Diagnosis not present

## 2023-10-22 DIAGNOSIS — Z85820 Personal history of malignant melanoma of skin: Secondary | ICD-10-CM

## 2023-10-22 DIAGNOSIS — F439 Reaction to severe stress, unspecified: Secondary | ICD-10-CM

## 2023-10-22 DIAGNOSIS — R7303 Prediabetes: Secondary | ICD-10-CM

## 2023-10-22 NOTE — Patient Instructions (Signed)
 Standing Labs We placed an order today for your standing lab work.   Please have your standing labs drawn in March and every 3 months  Please have your labs drawn 2 weeks prior to your appointment so that the provider can discuss your lab results at your appointment, if possible.  Please note that you may see your imaging and lab results in MyChart before we have reviewed them. We will contact you once all results are reviewed. Please allow our office up to 72 hours to thoroughly review all of the results before contacting the office for clarification of your results.  WALK-IN LAB HOURS  Monday through Thursday from 8:00 am -12:30 pm and 1:00 pm-5:00 pm and Friday from 8:00 am-12:00 pm.  Patients with office visits requiring labs will be seen before walk-in labs.  You may encounter longer than normal wait times. Please allow additional time. Wait times may be shorter on  Monday and Thursday afternoons.  We do not book appointments for walk-in labs. We appreciate your patience and understanding with our staff.   Labs are drawn by [facility]. Please bring your co-pay at the time of your lab draw.  You may receive a bill from [facility] for your lab work.  Please note if you are on Hydroxychloroquine and and an order has been placed for a Hydroxychloroquine level,  you will need to have it drawn 4 hours or more after your last dose.  If you wish to have your labs drawn at another location, please call the office 24 hours in advance so we can fax the orders.  The office is located at [address], [place], [place] [address]   If you have any questions regarding directions or hours of operation,  please call [phone].   As a reminder, please drink plenty of water prior to coming for your lab work. Thanks!   Vaccines You are taking a medication(s) that can suppress your immune system.  The following immunizations are recommended: Flu annually Covid-19  RSV Td/Tdap (tetanus,  diphtheria, pertussis) every 10 years Pneumonia (Prevnar 15 then Pneumovax 23 at least 1 year apart.  Alternatively, can take Prevnar 20 without needing additional dose) Shingrix: 2 doses from 4 weeks to 6 months apart  Please check with your PCP to make sure you are up to date.   If you have signs or symptoms of an infection or start antibiotics: First, call your PCP for workup of your infection. Hold your medication through the infection, until you complete your antibiotics, and until symptoms resolve if you take the following: Injectable medication (Actemra, Benlysta, Cimzia, Cosentyx, Enbrel, Humira, Kevzara, Orencia, Remicade, Simponi, Stelara, Taltz, Tremfya) Methotrexate Leflunomide (Arava) Mycophenolate (Cellcept) Harriette Ohara, Olumiant, or Rinvoq

## 2023-10-26 ENCOUNTER — Other Ambulatory Visit: Payer: Self-pay | Admitting: Family Medicine

## 2023-11-12 DIAGNOSIS — M79674 Pain in right toe(s): Secondary | ICD-10-CM | POA: Diagnosis not present

## 2023-11-12 DIAGNOSIS — B351 Tinea unguium: Secondary | ICD-10-CM | POA: Diagnosis not present

## 2023-12-03 ENCOUNTER — Other Ambulatory Visit: Payer: Self-pay | Admitting: *Deleted

## 2023-12-03 DIAGNOSIS — Z79899 Other long term (current) drug therapy: Secondary | ICD-10-CM

## 2023-12-03 MED ORDER — METHOTREXATE SODIUM 2.5 MG PO TABS: 15.0000 mg | ORAL_TABLET | ORAL | 0 refills | Status: DC

## 2023-12-03 NOTE — Telephone Encounter (Signed)
 Refill request received via fax from [facility] for Methotrexate   Last Fill: 09/17/2023  Labs: CBC and CMP are stable.  Hemoglobin is low and stable.     Next Visit: 03/29/2024  Last Visit: 10/22/2023  DX: Rheumatoid arthritis involving multiple sites with positive rheumatoid factor   Current Dose per office note 10/22/2023: Methotrexate 2.5 mg 6 tablets by mouth every 7 days   Patient advised he is due to update labs and will update them tomorrow.   Okay to refill Methotrexate?

## 2023-12-04 ENCOUNTER — Other Ambulatory Visit: Payer: Self-pay | Admitting: *Deleted

## 2023-12-04 DIAGNOSIS — Z79899 Other long term (current) drug therapy: Secondary | ICD-10-CM | POA: Diagnosis not present

## 2023-12-04 NOTE — Patient Instructions (Signed)
 Visit Information  Thank you for taking time to visit with me today. Please don't hesitate to contact me if I can be of assistance to you.   Following are the goals we discussed today:   Goals Addressed             This Visit's Progress    COMPLETED: Find Help in My Community   On track                          Patient reports having multiple community resources to assist him. He continues to drive Denies other medical concerns   - follow-up on any referrals for help I am given - have a back-up plan - make a list of family or friends that I can call      Interventions Today    Flowsheet Row Most Recent Value  Chronic Disease   Chronic disease during today's visit Other  [rheumatoid arthitis memory]  General Interventions   General Interventions Discussed/Reviewed General Interventions Reviewed, Doctor Visits  Doctor Visits Discussed/Reviewed Doctor Visits Discussed, PCP, Specialist  PCP/Specialist Visits Compliance with follow-up visit  Mental Health Interventions   Mental Health Discussed/Reviewed Mental Health Reviewed, Coping Strategies  Pharmacy Interventions   Pharmacy Dicussed/Reviewed Pharmacy Topics Reviewed, Affording Medications  Safety Interventions   Safety Discussed/Reviewed Safety Reviewed, Fall Risk, Home Safety  Home Safety Assistive Devices              Our next appointment is no further scheduled appointments.  on n/a at n/a  Please call the care guide team at [phone] if you need to cancel or reschedule your appointment.   If you are experiencing a Mental Health or Behavioral Health Crisis or need someone to talk to, please call the Suicide and Crisis Lifeline: 988 call the [place] National Suicide Prevention Lifeline: (905)370-9740 or TTY: 310-025-7033 TTY 364-178-4461) to talk to a trained counselor call 1-800-273-TALK (toll free, 24 hour hotline) call the [facility]: 7052268395 call 911   The patient verbalized  understanding of instructions, educational materials, and care plan provided today and DECLINED offer to receive copy of patient instructions, educational materials, and care plan.   The patient has been provided with contact information for the care management team and has been advised to call with any health related questions or concerns.    Daiden Coltrane L. Noelle Penner, RN, BSN, CCM [facility] RN Care Manager [phone]

## 2023-12-05 LAB — CBC WITH DIFFERENTIAL/PLATELET
Absolute Lymphocytes: 2805 {cells}/uL (ref 850–3900)
Absolute Monocytes: 496 {cells}/uL (ref 200–950)
Basophils Absolute: 22 {cells}/uL (ref 0–200)
Basophils Relative: 0.3 %
Eosinophils Absolute: 111 {cells}/uL (ref 15–500)
Eosinophils Relative: 1.5 %
HCT: 31.7 % — ABNORMAL LOW (ref 38.5–50.0)
Hemoglobin: 10.3 g/dL — ABNORMAL LOW (ref 13.2–17.1)
MCH: 32.8 pg (ref 27.0–33.0)
MCHC: 32.5 g/dL (ref 32.0–36.0)
MCV: 101 fL — ABNORMAL HIGH (ref 80.0–100.0)
MPV: 9.9 fL (ref 7.5–12.5)
Monocytes Relative: 6.7 %
Neutro Abs: 3966 {cells}/uL (ref 1500–7800)
Neutrophils Relative %: 53.6 %
Platelets: 174 10*3/uL (ref 140–400)
RBC: 3.14 10*6/uL — ABNORMAL LOW (ref 4.20–5.80)
RDW: 13.7 % (ref 11.0–15.0)
Total Lymphocyte: 37.9 %
WBC: 7.4 10*3/uL (ref 3.8–10.8)

## 2023-12-05 LAB — COMPREHENSIVE METABOLIC PANEL WITH GFR
AG Ratio: 1.6 (calc) (ref 1.0–2.5)
ALT: 11 U/L (ref 9–46)
AST: 17 U/L (ref 10–35)
Albumin: 3.9 g/dL (ref 3.6–5.1)
Alkaline phosphatase (APISO): 70 U/L (ref 35–144)
BUN: 17 mg/dL (ref 7–25)
CO2: 29 mmol/L (ref 20–32)
Calcium: 8.8 mg/dL (ref 8.6–10.3)
Chloride: 105 mmol/L (ref 98–110)
Creat: 0.8 mg/dL (ref 0.70–1.22)
Globulin: 2.5 g/dL (ref 1.9–3.7)
Glucose, Bld: 108 mg/dL — ABNORMAL HIGH (ref 65–99)
Potassium: 4.5 mmol/L (ref 3.5–5.3)
Sodium: 140 mmol/L (ref 135–146)
Total Bilirubin: 0.6 mg/dL (ref 0.2–1.2)
Total Protein: 6.4 g/dL (ref 6.1–8.1)
eGFR: 88 mL/min/{1.73_m2} (ref >=60)

## 2023-12-06 NOTE — Progress Notes (Signed)
 Hemoglobin is low.  Patient should take multivitamin with iron.  CMP is normal.  Please forward results to his PCP.

## 2023-12-07 ENCOUNTER — Telehealth: Payer: Self-pay

## 2023-12-07 NOTE — Telephone Encounter (Signed)
 Reason for CRM: Pts wife states she was advised to take a multi vitamin with iron, she wants to know if there is a specific brand/type she should purchase? Please advise, she states you can leave a vm if you do not get an answer, she will be returning to work soon

## 2023-12-07 NOTE — Telephone Encounter (Signed)
 Eliseo Gum is available in generic is a multivitamin with iron. I will also talk with Carney Bern about doing some more specific test to look at his hemoglobin he has an appointment coming up in a few days this can look at his iron levels as well as B12 thank you

## 2023-12-09 ENCOUNTER — Encounter: Payer: Self-pay | Admitting: Family Medicine

## 2023-12-09 ENCOUNTER — Ambulatory Visit (INDEPENDENT_AMBULATORY_CARE_PROVIDER_SITE_OTHER): Payer: PPO | Admitting: Family Medicine

## 2023-12-09 VITALS — BP 112/62 | HR 82 | Temp 97.1°F | Ht 64.0 in | Wt 180.0 lb

## 2023-12-09 DIAGNOSIS — E782 Mixed hyperlipidemia: Secondary | ICD-10-CM

## 2023-12-09 DIAGNOSIS — M0579 Rheumatoid arthritis with rheumatoid factor of multiple sites without organ or systems involvement: Secondary | ICD-10-CM

## 2023-12-09 DIAGNOSIS — D649 Anemia, unspecified: Secondary | ICD-10-CM | POA: Diagnosis not present

## 2023-12-09 DIAGNOSIS — D509 Iron deficiency anemia, unspecified: Secondary | ICD-10-CM

## 2023-12-09 DIAGNOSIS — R3916 Straining to void: Secondary | ICD-10-CM

## 2023-12-09 DIAGNOSIS — R7303 Prediabetes: Secondary | ICD-10-CM

## 2023-12-09 DIAGNOSIS — N401 Enlarged prostate with lower urinary tract symptoms: Secondary | ICD-10-CM | POA: Diagnosis not present

## 2023-12-09 NOTE — Progress Notes (Signed)
   Subjective:    Patient ID: Bryan Alderman Sr., male    DOB: Feb 25, 1941, 83 y.o.   MRN: [id number]  HPI Discussed the use of AI scribe software for clinical note transcription with the patient, who gave verbal consent to proceed.  History of Present Illness   Bryan MCCAREY Sr. is an 83 year old male who presents with joint stiffness and urinary incontinence.  He experiences intermittent stiffness in his arms, particularly in the joints. The stiffness is not constant and is currently not present. Walking around seems to alleviate the stiffness, but standing for prolonged periods requires him to rest in bed. He associates the stiffness with either heart issues or arthritis, as suggested by a chart he saw.  Urinary incontinence is a significant issue, requiring him to wear a diaper and additional absorbent pads. He needs to bathe frequently, sometimes multiple times a day, with assistance from his son. His wife, Bryan Maldonado, used to help him, but she is no longer present.  He tries to maintain a healthy diet with low sugar and salt intake. He avoids rice as it causes bloating unless it is well-cooked. He is cautious about eating salads, particularly lettuce, due to concerns about contamination and prefers to prepare them himself. He takes extra iron supplements, which has resulted in darker stools. He tries to consume iron-rich foods but is wary of potential contamination in restaurant salads and avoids lettuce due to concerns about pesticide residues.  No issues with bowel movements, stating they are regular. He has a concern about his ears possibly needing cleaning but was reassured they are fine with only minor wax present. He also notes a cracking sound in his neck when turning his head.        Review of Systems     Objective:   Physical Exam General-in no acute distress Eyes-no discharge Lungs-respiratory rate normal, CTA CV-no murmurs,RRR Extremities skin warm dry no edema Neuro grossly  normal Behavior normal, alert Severe rheumatoid arthritis       Assessment & Plan:  1. Mixed hyperlipidemia (Primary) Check lab work await results - Lipid Panel  2. Prediabetes Portion control regular physical activity check A1c - Hemoglobin A1c  3. Normochromic anemia Check iron levels.  Patient had problems in the past with iron deficiency - Iron, TIBC and Ferritin Panel  4. Benign prostatic hyperplasia (BPH) with straining on urination Continue Flomax  5. Iron deficiency anemia, unspecified iron deficiency anemia type Labs ordered - Iron, TIBC and Ferritin Panel  6. Rheumatoid arthritis involving multiple sites with positive rheumatoid factor (HCC) Follow through with specialist all this  Assessment and Plan    Joint Stiffness Intermittent arm stiffness likely due to arthritis.  Urinary Incontinence Frequent urination impacts daily life, requiring family assistance.  Hearing Concerns Minimal earwax without significant blockage or eardrum issues.  Iron Supplementation Taking extra iron as advised, with changes in stool color noted.  General Health Maintenance Maintaining a healthy diet with low sugar and salt intake. Avoids rice and lettuce due to personal dietary reactions and contamination concerns.  Follow-up Scheduled for blood work in July and follow-up in September. - Provide papers for blood work in mid-July. - Schedule follow-up appointment in September.

## 2023-12-09 NOTE — Telephone Encounter (Signed)
 Patient has an appt today.

## 2024-01-02 ENCOUNTER — Other Ambulatory Visit: Payer: Self-pay | Admitting: Physician Assistant

## 2024-01-04 NOTE — Telephone Encounter (Signed)
 Last Fill: 01/13/2023  Next Visit: 03/29/2024  Last Visit: 10/22/2023  Dx: Rheumatoid arthritis involving multiple sites with positive rheumatoid factor   Current Dose per office note on 10/22/2023: folic acid  1 mg 2 tablets daily.   Okay to refill Folic Acid ?

## 2024-01-09 ENCOUNTER — Other Ambulatory Visit: Payer: Self-pay | Admitting: Family Medicine

## 2024-01-19 ENCOUNTER — Encounter: Payer: Self-pay | Admitting: Gastroenterology

## 2024-01-21 DIAGNOSIS — M79674 Pain in right toe(s): Secondary | ICD-10-CM | POA: Diagnosis not present

## 2024-01-21 DIAGNOSIS — B351 Tinea unguium: Secondary | ICD-10-CM | POA: Diagnosis not present

## 2024-01-21 DIAGNOSIS — M79675 Pain in left toe(s): Secondary | ICD-10-CM | POA: Diagnosis not present

## 2024-02-29 ENCOUNTER — Other Ambulatory Visit: Payer: Self-pay | Admitting: Rheumatology

## 2024-02-29 NOTE — Telephone Encounter (Signed)
 Last Fill: 12/03/2023  Labs: 12/04/2023 Hemoglobin is low.  CMP is normal.   Next Visit: 03/29/2024  Last Visit: 10/22/2023  DX: Rheumatoid arthritis involving multiple sites with positive rheumatoid factor   Current Dose per office note 10/22/2023: Methotrexate  2.5 mg 6 tablets by mouth every 7 days   Okay to refill Methotrexate ?

## 2024-03-03 ENCOUNTER — Encounter: Payer: Self-pay | Admitting: Gastroenterology

## 2024-03-03 ENCOUNTER — Ambulatory Visit: Admitting: Gastroenterology

## 2024-03-03 VITALS — BP 133/73 | HR 58 | Temp 97.7°F | Ht 64.0 in | Wt 176.6 lb

## 2024-03-03 DIAGNOSIS — R1310 Dysphagia, unspecified: Secondary | ICD-10-CM | POA: Diagnosis not present

## 2024-03-03 DIAGNOSIS — K219 Gastro-esophageal reflux disease without esophagitis: Secondary | ICD-10-CM | POA: Diagnosis not present

## 2024-03-03 DIAGNOSIS — Z862 Personal history of diseases of the blood and blood-forming organs and certain disorders involving the immune mechanism: Secondary | ICD-10-CM

## 2024-03-03 DIAGNOSIS — D649 Anemia, unspecified: Secondary | ICD-10-CM | POA: Diagnosis not present

## 2024-03-03 NOTE — H&P (View-Only) (Signed)
 Gastroenterology Office Note     Primary Care Physician:  Alphonsa Glendia LABOR, MD  Primary Gastroenterologist: Dr. Cindie   Chief Complaint   Chief Complaint  Patient presents with   Follow-up     History of Present Illness   Bryan REAM Sr. is an 83 y.o. male presenting today with a history of chronic anemia, baseline Hgb 10-12, heme positive stool s/p colonoscopy April 2024, strong family history of colon cancer in 2 first-degree relatives, last seen a year ago and returning for follow-up. He notes that he lost his Bryan Maldonado end of last year. His daughter-in-law helps with medications and appointments.   April 2025: Hgb 10.3, down about a gram from Dec 2024. Has labs to be done July 2025 including iron  studies. He will be doing this today. Appears to be taking iron  about twice a week. Last iron  studies on file from Jan 2024 with iron  55, iron  sats 19, ferritin 72, which was down from 2021 at 179.   Pantoprazole 40 mg once daily. Controlling GERD. Will have breakthrough GERD if pizza or lasagna. Avoids these foods. Sometimes with solid food dysphagia, breads and chicken. Notes left-sided abdominal discomfort intermittently. Will take a stool softener with good  results and resolution of abdominal pain.   Colonoscopy April 2024: non-bleeding hemorrhoids, diverticulosis, five 3-6 mm polyps in ascending and cecum, one 4 mm polyp in descending colon. Path: tubular adenomas. No repeat colon due to age.   Possible EGD many years ago but unable to recall.  family history of colon cancer in his sister (age 8) and father (age 1).    Past Medical History:  Diagnosis Date   Anemia    Basal cell carcinoma    BPH (benign prostatic hyperplasia)    GERD (gastroesophageal reflux disease)    Melanoma (HCC)    Osteoarthritis    Rheumatoid aortitis    Rheumatoid arthritis [facility])     Past Surgical History:  Procedure Laterality Date   CATARACT EXTRACTION W/PHACO Right 03/12/2020    Procedure: CATARACT EXTRACTION PHACO AND INTRAOCULAR LENS PLACEMENT RIGHT EYE;  Surgeon: Harrie Agent, MD;  Location: [facility];  Service: Ophthalmology;  Laterality: Right;  CDE: 9.99   CATARACT EXTRACTION W/PHACO Left 03/26/2020   Procedure: CATARACT EXTRACTION PHACO AND INTRAOCULAR LENS PLACEMENT (IOC);  Surgeon: Harrie Agent, MD;  Location: [facility];  Service: Ophthalmology;  Laterality: Left;  CDE: 9.16    COLONOSCOPY     COLONOSCOPY WITH PROPOFOL  N/A 12/22/2022   Procedure: COLONOSCOPY WITH PROPOFOL ;  Surgeon: Cindie Carlin POUR, DO;  Location: [facility];  Service: Endoscopy;  Laterality: N/A;  10:15AM;ASA 3   ESOPHAGEAL DILATION     HERNIA REPAIR     JOINT REPLACEMENT Bilateral    KNEE ARTHROPLASTY     MELANOMA EXCISION     MELANOMA EXCISION  09/22/2018   on head   POLYPECTOMY  12/22/2022   Procedure: POLYPECTOMY;  Surgeon: Cindie Carlin POUR, DO;  Location: [facility];  Service: Endoscopy;;    Current Outpatient Medications  Medication Sig Dispense Refill   acetaminophen  (TYLENOL ) 650 MG CR tablet Take 1,300 mg by mouth 3 (three) times daily.     atorvastatin  (LIPITOR) 10 MG tablet TAKE 1 TABLET(10 MG) BY MOUTH DAILY 90 tablet 1   B Complex-C (B-COMPLEX WITH VITAMIN C) tablet Take 1 tablet by mouth daily.     CALCIUM  MAGNESIUM ZINC PO Take by mouth daily.     Carboxymethylcellulose Sodium (REFRESH PLUS OP) Apply  to eye.     cetirizine (ZYRTEC) 10 MG tablet Take 10 mg by mouth daily.      cholecalciferol (VITAMIN D3) 25 MCG (1000 UNIT) tablet Take 1,000 Units by mouth daily.     docusate sodium (COLACE) 100 MG capsule Take 100 mg by mouth 2 (two) times daily.     Ferrous Sulfate (IRON ) 325 (65 Fe) MG TABS One on Mondays and one on Fridays 30 tablet 6   FLUoxetine  (PROZAC ) 20 MG capsule Take 1 capsule (20 mg total) by mouth daily. 30 capsule 5   folic acid  (FOLVITE ) 1 MG tablet TAKE 2 TABLETS BY MOUTH DAILY 180 tablet 3   furosemide  (LASIX ) 20 MG tablet Take 20 mg by mouth  daily. TAKE 1/2 TABLET(20 MG) BY MOUTH EVERY MORNING AS NEEDED FOR FLUID IN LEGS     Hypromellose (PURE & GENTLE LUBRICANT) 0.3 % SOLN Apply to eye.     methotrexate  (RHEUMATREX) 2.5 MG tablet Take 6 tablets (15 mg total) by mouth once a week. Caution:Chemotherapy. Protect from light. 72 tablet 0   Multiple Vitamins-Minerals (CENTRUM SILVER  PO) Take by mouth.     Multiple Vitamins-Minerals (OCUVITE PRESERVISION PO) Take by mouth.     OVER THE COUNTER MEDICATION Dr. Heriberto foot cream     OVER THE COUNTER MEDICATION Cayenne pepper     pantoprazole (PROTONIX) 40 MG tablet Take 40 mg by mouth daily.     potassium chloride  SA (KLOR-CON  M) 20 MEQ tablet Take 20 mEq by mouth 2 (two) times daily. 1/2 tablet  every day when using lasix      Propylene Glycol (SYSTANE COMPLETE OP) Apply to eye.     tamsulosin  (FLOMAX ) 0.4 MG CAPS capsule TAKE 2 CAPSULES BY MOUTH EVERY EVENING 60 capsule 5   vitamin C (ASCORBIC ACID) 500 MG tablet Take 500 mg by mouth daily.     No current facility-administered medications for this visit.    Allergies as of 03/03/2024 - Review Complete 03/03/2024  Allergen Reaction Noted   Morphine and codeine  Other (See Comments) 01/04/2013   Acyclovir and related  08/27/2017    Family History  Problem Relation Age of Onset   Rheum arthritis Mother    Emphysema Father    Cancer Brother    Emphysema Sister     Social History   Socioeconomic History   Marital status: Married    Spouse name: Not on file   Number of children: Not on file   Years of education: Not on file   Highest education level: Not on file  Occupational History   Not on file  Tobacco Use   Smoking status: Never    Passive exposure: Current   Smokeless tobacco: Never  Vaping Use   Vaping status: Never Used  Substance and Sexual Activity   Alcohol use: No   Drug use: No   Sexual activity: Not on file  Other Topics Concern   Not on file  Social History Narrative   Not on file   Social Drivers  of Health   Financial Resource Strain: Low Risk  (01/08/2021)   Overall Financial Resource Strain (CARDIA)    Difficulty of Paying Living Expenses: Not hard at all  Food Insecurity: No Food Insecurity (05/26/2023)   Hunger Vital Sign    Worried About Running Out of Food in the Last Year: Never true    Ran Out of Food in the Last Year: Never true  Transportation Needs: No Transportation Needs (05/26/2023)   PRAPARE -  Administrator, Civil Service (Medical): No    Lack of Transportation (Non-Medical): No  Physical Activity: Inactive (01/08/2021)   Exercise Vital Sign    Days of Exercise per Week: 0 days    Minutes of Exercise per Session: 0 min  Stress: No Stress Concern Present (01/08/2021)   [employer] of Occupational Health - Occupational Stress Questionnaire    Feeling of Stress : Not at all  Social Connections: Moderately Integrated (05/26/2023)   Social Connection and Isolation Panel    Frequency of Communication with Friends and Family: Twice a week    Frequency of Social Gatherings with Friends and Family: More than three times a week    Attends Religious Services: More than 4 times per year    Active Member of [employer] or Organizations: No    Attends Banker Meetings: Never    Marital Status: Married  Catering manager Violence: Not At Risk (05/26/2023)   Humiliation, Afraid, Rape, and Kick questionnaire    Fear of Current or Ex-Partner: No    Emotionally Abused: No    Physically Abused: No    Sexually Abused: No     Review of Systems   Gen: Denies any fever, chills, fatigue, weight loss, lack of appetite.  CV: Denies chest pain, heart palpitations, peripheral edema, syncope.  Resp: Denies shortness of breath at rest or with exertion. Denies wheezing or cough.  GI: Denies dysphagia or odynophagia. Denies jaundice, hematemesis, fecal incontinence. GU : Denies urinary burning, urinary frequency, urinary hesitancy MS: Denies joint pain, muscle  weakness, cramps, or limitation of movement.  Derm: Denies rash, itching, dry skin Psych: Denies depression, anxiety, memory loss, and confusion Heme: Denies bruising, bleeding, and enlarged lymph nodes.   Physical Exam   BP 133/73 (BP Location: Right Arm, Patient Position: Sitting, Cuff Size: Normal)   Pulse (!) 58   Temp 97.7 F (36.5 C) (Oral)   Ht 5' 4 (1.626 m)   Wt 176 lb 9.6 oz (80.1 kg)   SpO2 97%   BMI 30.31 kg/m  General:   Alert and oriented. Pleasant and cooperative. Well-nourished and well-developed.  Head:  Normocephalic and atraumatic. Eyes:  Without icterus Abdomen:  +BS, soft, non-tender and non-distended. No HSM noted. No guarding or rebound. No masses appreciated.  Rectal:  Deferred  Msk:  contractured bilateral hands consistent with severe RA Extremities:  Without edema. Neurologic:  Alert and  oriented x4 Skin:  Intact without significant lesions or rashes. Psych:  Alert and cooperative. Normal mood and affect.  Lab Results  Component Value Date   WBC 7.4 12/04/2023   HGB 10.3 (L) 12/04/2023   HCT 31.7 (L) 12/04/2023   MCV 101.0 (H) 12/04/2023   PLT 174 12/04/2023   Lab Results  Component Value Date   ALT 11 12/04/2023   AST 17 12/04/2023   ALKPHOS 69 04/02/2023   BILITOT 0.6 12/04/2023   Lab Results  Component Value Date   NA 140 12/04/2023   CL 105 12/04/2023   K 4.5 12/04/2023   CO2 29 12/04/2023   BUN 17 12/04/2023   CREATININE 0.80 12/04/2023   EGFR 88 12/04/2023   CALCIUM  8.8 12/04/2023   ALBUMIN 4.0 04/02/2023   GLUCOSE 108 (H) 12/04/2023   Lab Results  Component Value Date   IRON  55 09/08/2022   TIBC 285 09/08/2022   FERRITIN 72 09/08/2022      Assessment   Bryan A Yoshimura Sr. is a sweet 83 y.o. male presenting  today with a history of chronic anemia, baseline Hgb 10-12, heme positive stool s/p colonoscopy April 2024, strong family history of colon cancer in 2 first-degree relatives, last seen a year ago and returning for  follow-up. He notes that he lost his Bryan Maldonado end of last year. His daughter-in-law helps with medications and appointments.   Anemia: Hgb drifting since Dec 2024 and down about a gram with last check April 2025 (10.3 most recent). I note last iron  studies on file from 2024 with ferritin 72, but this was down from several years ago at 179. Although he has colonoscopy on file, would benefit from EGD due to component of IDA contributing to anemia. He will also update iron  studies today, which were ordered by PCP. Chronically on methotrexate .   GERD with dysphagia: chronic GERD noted, currently on pantoprazole with overall good control as long as avoiding red sauces. Solid food dysphagia intermittently. He reports an EGD in the past, but I have no records of this. With anemia and dysphagia, will arrange EGD in near future with dilation.      PLAN   Complete labs today Dr. Alphonsa had already requested (which includes iron  studies) Proceed with upper endoscopy/dilation by Dr. Cindie in near future: the risks, benefits, and alternatives have been discussed with the patient in detail. The patient states understanding and desires to proceed.  Continue pantoprazole daily Further recommendations to follow    Therisa MICAEL Stager, PhD, ANP-BC [facility]

## 2024-03-03 NOTE — Progress Notes (Signed)
 Gastroenterology Office Note     Primary Care Physician:  Alphonsa Glendia LABOR, MD  Primary Gastroenterologist: Dr. Cindie   Chief Complaint   Chief Complaint  Patient presents with   Follow-up     History of Present Illness   Bryan REAM Sr. is an 83 y.o. male presenting today with a history of chronic anemia, baseline Hgb 10-12, heme positive stool s/p colonoscopy April 2024, strong family history of colon cancer in 2 first-degree relatives, last seen a year ago and returning for follow-up. He notes that he lost his wife end of last year. His daughter-in-law helps with medications and appointments.   April 2025: Hgb 10.3, down about a gram from Dec 2024. Has labs to be done July 2025 including iron  studies. He will be doing this today. Appears to be taking iron  about twice a week. Last iron  studies on file from Jan 2024 with iron  55, iron  sats 19, ferritin 72, which was down from 2021 at 179.   Pantoprazole 40 mg once daily. Controlling GERD. Will have breakthrough GERD if pizza or lasagna. Avoids these foods. Sometimes with solid food dysphagia, breads and chicken. Notes left-sided abdominal discomfort intermittently. Will take a stool softener with good  results and resolution of abdominal pain.   Colonoscopy April 2024: non-bleeding hemorrhoids, diverticulosis, five 3-6 mm polyps in ascending and cecum, one 4 mm polyp in descending colon. Path: tubular adenomas. No repeat colon due to age.   Possible EGD many years ago but unable to recall.  family history of colon cancer in his sister (age 8) and father (age 1).    Past Medical History:  Diagnosis Date   Anemia    Basal cell carcinoma    BPH (benign prostatic hyperplasia)    GERD (gastroesophageal reflux disease)    Melanoma (HCC)    Osteoarthritis    Rheumatoid aortitis    Rheumatoid arthritis [facility])     Past Surgical History:  Procedure Laterality Date   CATARACT EXTRACTION W/PHACO Right 03/12/2020    Procedure: CATARACT EXTRACTION PHACO AND INTRAOCULAR LENS PLACEMENT RIGHT EYE;  Surgeon: Harrie Agent, MD;  Location: [facility];  Service: Ophthalmology;  Laterality: Right;  CDE: 9.99   CATARACT EXTRACTION W/PHACO Left 03/26/2020   Procedure: CATARACT EXTRACTION PHACO AND INTRAOCULAR LENS PLACEMENT (IOC);  Surgeon: Harrie Agent, MD;  Location: [facility];  Service: Ophthalmology;  Laterality: Left;  CDE: 9.16    COLONOSCOPY     COLONOSCOPY WITH PROPOFOL  N/A 12/22/2022   Procedure: COLONOSCOPY WITH PROPOFOL ;  Surgeon: Cindie Carlin POUR, DO;  Location: [facility];  Service: Endoscopy;  Laterality: N/A;  10:15AM;ASA 3   ESOPHAGEAL DILATION     HERNIA REPAIR     JOINT REPLACEMENT Bilateral    KNEE ARTHROPLASTY     MELANOMA EXCISION     MELANOMA EXCISION  09/22/2018   on head   POLYPECTOMY  12/22/2022   Procedure: POLYPECTOMY;  Surgeon: Cindie Carlin POUR, DO;  [facility];  Service: Endoscopy;;    Current Outpatient Medications  Medication Sig Dispense Refill   acetaminophen  (TYLENOL ) 650 MG CR tablet Take 1,300 mg by mouth 3 (three) times daily.     atorvastatin  (LIPITOR) 10 MG tablet TAKE 1 TABLET(10 MG) BY MOUTH DAILY 90 tablet 1   B Complex-C (B-COMPLEX WITH VITAMIN C) tablet Take 1 tablet by mouth daily.     CALCIUM  MAGNESIUM ZINC PO Take by mouth daily.     Carboxymethylcellulose Sodium (REFRESH PLUS OP) Apply  to eye.     cetirizine (ZYRTEC) 10 MG tablet Take 10 mg by mouth daily.      cholecalciferol (VITAMIN D3) 25 MCG (1000 UNIT) tablet Take 1,000 Units by mouth daily.     docusate sodium (COLACE) 100 MG capsule Take 100 mg by mouth 2 (two) times daily.     Ferrous Sulfate (IRON ) 325 (65 Fe) MG TABS One on Mondays and one on Fridays 30 tablet 6   FLUoxetine  (PROZAC ) 20 MG capsule Take 1 capsule (20 mg total) by mouth daily. 30 capsule 5   folic acid  (FOLVITE ) 1 MG tablet TAKE 2 TABLETS BY MOUTH DAILY 180 tablet 3   furosemide  (LASIX ) 20 MG tablet Take 20 mg by mouth  daily. TAKE 1/2 TABLET(20 MG) BY MOUTH EVERY MORNING AS NEEDED FOR FLUID IN LEGS     Hypromellose (PURE & GENTLE LUBRICANT) 0.3 % SOLN Apply to eye.     methotrexate  (RHEUMATREX) 2.5 MG tablet Take 6 tablets (15 mg total) by mouth once a week. Caution:Chemotherapy. Protect from light. 72 tablet 0   Multiple Vitamins-Minerals (CENTRUM SILVER  PO) Take by mouth.     Multiple Vitamins-Minerals (OCUVITE PRESERVISION PO) Take by mouth.     OVER THE COUNTER MEDICATION Dr. Heriberto foot cream     OVER THE COUNTER MEDICATION Cayenne pepper     pantoprazole (PROTONIX) 40 MG tablet Take 40 mg by mouth daily.     potassium chloride  SA (KLOR-CON  M) 20 MEQ tablet Take 20 mEq by mouth 2 (two) times daily. 1/2 tablet  every day when using lasix      Propylene Glycol (SYSTANE COMPLETE OP) Apply to eye.     tamsulosin  (FLOMAX ) 0.4 MG CAPS capsule TAKE 2 CAPSULES BY MOUTH EVERY EVENING 60 capsule 5   vitamin C (ASCORBIC ACID) 500 MG tablet Take 500 mg by mouth daily.     No current facility-administered medications for this visit.    Allergies as of 03/03/2024 - Review Complete 03/03/2024  Allergen Reaction Noted   Morphine and codeine  Other (See Comments) 01/04/2013   Acyclovir and related  08/27/2017    Family History  Problem Relation Age of Onset   Rheum arthritis Mother    Emphysema Father    Cancer Brother    Emphysema Sister     Social History   Socioeconomic History   Marital status: Married    Spouse name: Not on file   Number of children: Not on file   Years of education: Not on file   Highest education level: Not on file  Occupational History   Not on file  Tobacco Use   Smoking status: Never    Passive exposure: Current   Smokeless tobacco: Never  Vaping Use   Vaping status: Never Used  Substance and Sexual Activity   Alcohol use: No   Drug use: No   Sexual activity: Not on file  Other Topics Concern   Not on file  Social History Narrative   Not on file   Social Drivers  of Health   Financial Resource Strain: Low Risk  (01/08/2021)   Overall Financial Resource Strain (CARDIA)    Difficulty of Paying Living Expenses: Not hard at all  Food Insecurity: No Food Insecurity (05/26/2023)   Hunger Vital Sign    Worried About Running Out of Food in the Last Year: Never true    Ran Out of Food in the Last Year: Never true  Transportation Needs: No Transportation Needs (05/26/2023)   PRAPARE -  Administrator, Civil Service (Medical): No    Lack of Transportation (Non-Medical): No  Physical Activity: Inactive (01/08/2021)   Exercise Vital Sign    Days of Exercise per Week: 0 days    Minutes of Exercise per Session: 0 min  Stress: No Stress Concern Present (01/08/2021)   [employer] of Occupational Health - Occupational Stress Questionnaire    Feeling of Stress : Not at all  Social Connections: Moderately Integrated (05/26/2023)   Social Connection and Isolation Panel    Frequency of Communication with Friends and Family: Twice a week    Frequency of Social Gatherings with Friends and Family: More than three times a week    Attends Religious Services: More than 4 times per year    Active Member of [employer] or Organizations: No    Attends Banker Meetings: Never    Marital Status: Married  Catering manager Violence: Not At Risk (05/26/2023)   Humiliation, Afraid, Rape, and Kick questionnaire    Fear of Current or Ex-Partner: No    Emotionally Abused: No    Physically Abused: No    Sexually Abused: No     Review of Systems   Gen: Denies any fever, chills, fatigue, weight loss, lack of appetite.  CV: Denies chest pain, heart palpitations, peripheral edema, syncope.  Resp: Denies shortness of breath at rest or with exertion. Denies wheezing or cough.  GI: Denies dysphagia or odynophagia. Denies jaundice, hematemesis, fecal incontinence. GU : Denies urinary burning, urinary frequency, urinary hesitancy MS: Denies joint pain, muscle  weakness, cramps, or limitation of movement.  Derm: Denies rash, itching, dry skin Psych: Denies depression, anxiety, memory loss, and confusion Heme: Denies bruising, bleeding, and enlarged lymph nodes.   Physical Exam   BP 133/73 (BP Location: Right Arm, Patient Position: Sitting, Cuff Size: Normal)   Pulse (!) 58   Temp 97.7 F (36.5 C) (Oral)   Ht 5' 4 (1.626 m)   Wt 176 lb 9.6 oz (80.1 kg)   SpO2 97%   BMI 30.31 kg/m  General:   Alert and oriented. Pleasant and cooperative. Well-nourished and well-developed.  Head:  Normocephalic and atraumatic. Eyes:  Without icterus Abdomen:  +BS, soft, non-tender and non-distended. No HSM noted. No guarding or rebound. No masses appreciated.  Rectal:  Deferred  Msk:  contractured bilateral hands consistent with severe RA Extremities:  Without edema. Neurologic:  Alert and  oriented x4 Skin:  Intact without significant lesions or rashes. Psych:  Alert and cooperative. Normal mood and affect.  Lab Results  Component Value Date   WBC 7.4 12/04/2023   HGB 10.3 (L) 12/04/2023   HCT 31.7 (L) 12/04/2023   MCV 101.0 (H) 12/04/2023   PLT 174 12/04/2023   Lab Results  Component Value Date   ALT 11 12/04/2023   AST 17 12/04/2023   ALKPHOS 69 04/02/2023   BILITOT 0.6 12/04/2023   Lab Results  Component Value Date   NA 140 12/04/2023   CL 105 12/04/2023   K 4.5 12/04/2023   CO2 29 12/04/2023   BUN 17 12/04/2023   CREATININE 0.80 12/04/2023   EGFR 88 12/04/2023   CALCIUM  8.8 12/04/2023   ALBUMIN 4.0 04/02/2023   GLUCOSE 108 (H) 12/04/2023   Lab Results  Component Value Date   IRON  55 09/08/2022   TIBC 285 09/08/2022   FERRITIN 72 09/08/2022      Assessment   Bryan A Yoshimura Sr. is a sweet 83 y.o. male presenting  today with a history of chronic anemia, baseline Hgb 10-12, heme positive stool s/p colonoscopy April 2024, strong family history of colon cancer in 2 first-degree relatives, last seen a year ago and returning for  follow-up. He notes that he lost his wife end of last year. His daughter-in-law helps with medications and appointments.   Anemia: Hgb drifting since Dec 2024 and down about a gram with last check April 2025 (10.3 most recent). I note last iron  studies on file from 2024 with ferritin 72, but this was down from several years ago at 179. Although he has colonoscopy on file, would benefit from EGD due to component of IDA contributing to anemia. He will also update iron  studies today, which were ordered by PCP. Chronically on methotrexate .   GERD with dysphagia: chronic GERD noted, currently on pantoprazole with overall good control as long as avoiding red sauces. Solid food dysphagia intermittently. He reports an EGD in the past, but I have no records of this. With anemia and dysphagia, will arrange EGD in near future with dilation.      PLAN   Complete labs today Dr. Alphonsa had already requested (which includes iron  studies) Proceed with upper endoscopy/dilation by Dr. Cindie in near future: the risks, benefits, and alternatives have been discussed with the patient in detail. The patient states understanding and desires to proceed.  Continue pantoprazole daily Further recommendations to follow    Therisa MICAEL Stager, PhD, ANP-BC [facility]

## 2024-03-03 NOTE — Patient Instructions (Signed)
 We are arranging an upper endoscopy with dilation by Dr. Cindie in the near future.  Continue pantoprazole once daily.  Please have your labs done today.   Further recommendations to follow!  Have a safe and happy July 4th!  I enjoyed seeing you again today! I value our relationship and want to provide genuine, compassionate, and quality care. You may receive a survey regarding your visit with me, and I welcome your feedback! Thanks so much for taking the time to complete this. I look forward to seeing you again.      Therisa MICAEL Stager, PhD, ANP-BC [facility]

## 2024-03-06 ENCOUNTER — Other Ambulatory Visit: Payer: Self-pay | Admitting: Family Medicine

## 2024-03-06 DIAGNOSIS — F324 Major depressive disorder, single episode, in partial remission: Secondary | ICD-10-CM

## 2024-03-08 ENCOUNTER — Encounter: Payer: Self-pay | Admitting: *Deleted

## 2024-03-08 ENCOUNTER — Other Ambulatory Visit: Payer: Self-pay | Admitting: *Deleted

## 2024-03-08 DIAGNOSIS — Z862 Personal history of diseases of the blood and blood-forming organs and certain disorders involving the immune mechanism: Secondary | ICD-10-CM

## 2024-03-08 DIAGNOSIS — R1312 Dysphagia, oropharyngeal phase: Secondary | ICD-10-CM

## 2024-03-14 ENCOUNTER — Other Ambulatory Visit: Payer: Self-pay | Admitting: Family Medicine

## 2024-03-14 DIAGNOSIS — R1312 Dysphagia, oropharyngeal phase: Secondary | ICD-10-CM | POA: Diagnosis not present

## 2024-03-14 DIAGNOSIS — Z862 Personal history of diseases of the blood and blood-forming organs and certain disorders involving the immune mechanism: Secondary | ICD-10-CM | POA: Diagnosis not present

## 2024-03-14 DIAGNOSIS — R7303 Prediabetes: Secondary | ICD-10-CM | POA: Diagnosis not present

## 2024-03-14 DIAGNOSIS — D509 Iron deficiency anemia, unspecified: Secondary | ICD-10-CM | POA: Diagnosis not present

## 2024-03-14 DIAGNOSIS — E782 Mixed hyperlipidemia: Secondary | ICD-10-CM | POA: Diagnosis not present

## 2024-03-14 DIAGNOSIS — D649 Anemia, unspecified: Secondary | ICD-10-CM | POA: Diagnosis not present

## 2024-03-15 ENCOUNTER — Ambulatory Visit: Payer: Self-pay | Admitting: Family Medicine

## 2024-03-15 LAB — BASIC METABOLIC PANEL WITH GFR
BUN/Creatinine Ratio: 28 — ABNORMAL HIGH (ref 10–24)
BUN: 20 mg/dL (ref 8–27)
CO2: 23 mmol/L (ref 20–29)
Calcium: 9.2 mg/dL (ref 8.6–10.2)
Chloride: 101 mmol/L (ref 96–106)
Creatinine, Ser: 0.71 mg/dL — ABNORMAL LOW (ref 0.76–1.27)
Glucose: 101 mg/dL — ABNORMAL HIGH (ref 70–99)
Potassium: 5.1 mmol/L (ref 3.5–5.2)
Sodium: 138 mmol/L (ref 134–144)
eGFR: 91 mL/min/1.73 (ref >59)

## 2024-03-15 LAB — LIPID PANEL
Chol/HDL Ratio: 2.2 ratio (ref 0.0–5.0)
Cholesterol, Total: 152 mg/dL (ref 100–199)
HDL: 70 mg/dL (ref >39)
LDL Chol Calc (NIH): 65 mg/dL (ref 0–99)
Triglycerides: 95 mg/dL (ref 0–149)
VLDL Cholesterol Cal: 17 mg/dL (ref 5–40)

## 2024-03-15 LAB — HEMOGLOBIN A1C
Est. average glucose Bld gHb Est-mCnc: 140 mg/dL
Hgb A1c MFr Bld: 6.5 % — ABNORMAL HIGH (ref 4.8–5.6)

## 2024-03-15 LAB — IRON,TIBC AND FERRITIN PANEL
Ferritin: 218 ng/mL (ref 30–400)
Iron Saturation: 32 % (ref 15–55)
Iron: 90 ug/dL (ref 38–169)
Total Iron Binding Capacity: 281 ug/dL (ref 250–450)
UIBC: 191 ug/dL (ref 111–343)

## 2024-03-15 NOTE — Progress Notes (Signed)
 Office Visit Note  Patient: Bryan Holifield Hodkinson Sr.             Date of Birth: 17-Sep-1940           MRN: [id number]             PCP: Alphonsa Glendia LABOR, MD Referring: Alphonsa Glendia LABOR, MD Visit Date: 03/29/2024 Occupation: @GUAROCC @  Subjective:  Pain in joints  History of Present Illness: Bryan TREAT Sr. is a 83 y.o. male with seropositive rheumatoid arthritis and osteoarthritis.  He returns today after his last visit in February 2025.  He continues to have morning stiffness lasting for 15 minutes.  He has occasional nocturnal pain.  He has difficulty getting dressed climbing stairs and getting up from the chair.  He has not noticed any increased joint swelling.  He notices some fluid retention on his legs.  He has been on methotrexate  6 tablets weekly and folic acid  2 mg daily without any interruption.  He still gets helps from his son and daughter-in-law.  He also has a friend who comes and visits over the weekend.    Activities of Daily Living:  Patient reports morning stiffness for 15 minutes.   Patient Denies nocturnal pain.  Difficulty dressing/grooming: Reports Difficulty climbing stairs: Reports Difficulty getting out of chair: Reports Difficulty using hands for taps, buttons, cutlery, and/or writing: Reports  Review of Systems  Constitutional:  Negative for fatigue.  HENT:  Positive for mouth dryness. Negative for mouth sores.   Eyes:  Positive for dryness.  Respiratory:  Negative for shortness of breath.   Cardiovascular:  Negative for palpitations.  Gastrointestinal:  Positive for constipation. Negative for diarrhea.  Endocrine: Negative for increased urination.  Genitourinary:  Positive for urgency.  Musculoskeletal:  Positive for joint pain, joint pain and morning stiffness.  Skin:  Negative for color change, rash and sensitivity to sunlight.  Allergic/Immunologic: Negative for susceptible to infections.  Neurological:  Negative for fainting and memory loss.   Hematological:  Negative for swollen glands.  Psychiatric/Behavioral:  Negative for depressed mood and sleep disturbance. The patient is not nervous/anxious.     PMFS History:  Patient Active Problem List   Diagnosis Date Noted   [name] (HCC) 01/29/2021   Anemia, chronic disease 06/25/2020   High risk medication use 09/19/2016   History of total knee replacement, bilateral 09/19/2016   Gastroesophageal reflux disease without esophagitis 09/19/2016   History of melanoma 09/19/2016   History of basal cell carcinoma 09/19/2016   History of anemia 09/19/2016   Allergic rhinitis 02/18/2016   Prediabetes 11/20/2014   Hyperlipidemia 11/20/2014   BPH (benign prostatic hyperplasia) 05/22/2014   Rheumatoid arthritis involving multiple sites with positive rheumatoid factor (HCC) 01/05/2013   Chronic pain syndrome 01/05/2013    Past Medical History:  Diagnosis Date   Anemia    Basal cell carcinoma    BPH (benign prostatic hyperplasia)    GERD (gastroesophageal reflux disease)    Melanoma (HCC)    Osteoarthritis    Rheumatoid aortitis    Rheumatoid arthritis (HCC)     Family History  Problem Relation Age of Onset   Rheum arthritis Mother    Emphysema Father    Cancer Brother    Emphysema Sister    Past Surgical History:  Procedure Laterality Date   CATARACT EXTRACTION W/PHACO Right 03/12/2020   Procedure: CATARACT EXTRACTION PHACO AND INTRAOCULAR LENS PLACEMENT RIGHT EYE;  Surgeon: Harrie Agent, MD;  Location: [facility];  Service: Ophthalmology;  Laterality: Right;  CDE: 9.99   CATARACT EXTRACTION W/PHACO Left 03/26/2020   Procedure: CATARACT EXTRACTION PHACO AND INTRAOCULAR LENS PLACEMENT (IOC);  Surgeon: Harrie Agent, MD;  Location: [facility];  Service: Ophthalmology;  Laterality: Left;  CDE: 9.16    COLONOSCOPY     COLONOSCOPY WITH PROPOFOL  N/A 12/22/2022   Procedure: COLONOSCOPY WITH PROPOFOL ;  Surgeon: Cindie Carlin POUR, DO;  [facility];  Service:  Endoscopy;  Laterality: N/A;  10:15AM;ASA 3   ESOPHAGEAL DILATION     ESOPHAGEAL DILATION N/A 03/22/2024   Procedure: DILATION, ESOPHAGUS;  Surgeon: Cindie Carlin POUR, DO;  [facility];  Service: Endoscopy;  Laterality: N/A;   ESOPHAGOGASTRODUODENOSCOPY N/A 03/22/2024   Procedure: EGD (ESOPHAGOGASTRODUODENOSCOPY);  Surgeon: Cindie Carlin POUR, DO;  [facility];  Service: Endoscopy;  Laterality: N/A;  1:15 pm, asa 2   HERNIA REPAIR     JOINT REPLACEMENT Bilateral    KNEE ARTHROPLASTY     MELANOMA EXCISION     MELANOMA EXCISION  09/22/2018   on head   POLYPECTOMY  12/22/2022   Procedure: POLYPECTOMY;  Surgeon: Cindie Carlin POUR, DO;  [facility];  Service: Endoscopy;;   Social History   Social History Narrative   Not on file   Immunization History  Administered Date(s) Administered   Fluad Quad(high Dose 65+) 06/25/2020, 06/06/2021, 06/30/2022   Fluad Trivalent(High Dose 65+) 05/26/2023   Influenza Split 05/19/2013   Influenza, High Dose Seasonal PF 05/20/2019   Influenza,inj,Quad PF,6+ Mos 05/22/2014, 05/22/2015, 06/06/2016, 06/10/2017, 05/14/2018   Influenza-Unspecified 05/20/2019   PFIZER(Purple Top)SARS-COV-2 Vaccination 11/14/2019, 12/05/2019   PNEUMOCOCCAL CONJUGATE-20 09/10/2023   Pneumococcal Conjugate-13 05/22/2014   Pneumococcal Polysaccharide-23 05/19/2013   Zoster Recombinant(Shingrix) 09/06/2018, 03/11/2019     Objective: Vital Signs: BP (!) 124/59 (BP Location: Left Arm, Patient Position: Sitting, Cuff Size: Normal)   Pulse (!) 55   Resp 17   Ht 5' 5 (1.651 m)   Wt 185 lb (83.9 kg)   BMI 30.79 kg/m    Physical Exam Vitals and nursing note reviewed.  Constitutional:      Appearance: He is well-developed.  HENT:     Head: Normocephalic and atraumatic.  Eyes:     Conjunctiva/sclera: Conjunctivae normal.     Pupils: Pupils are equal, round, and reactive to light.  Cardiovascular:     Rate and Rhythm: Normal rate and  regular rhythm.     Heart sounds: Normal heart sounds.  Pulmonary:     Effort: Pulmonary effort is normal.     Breath sounds: Normal breath sounds.  Abdominal:     General: Bowel sounds are normal.     Palpations: Abdomen is soft.  Musculoskeletal:     Cervical back: Normal range of motion and neck supple.     Right lower leg: Edema present.     Left lower leg: Edema present.  Skin:    General: Skin is warm and dry.     Capillary Refill: Capillary refill takes less than 2 seconds.  Neurological:     Mental Status: He is alert and oriented to person, place, and time.  Psychiatric:        Behavior: Behavior normal.      Musculoskeletal Exam: Patient examined in the seated position.  He has some limitation rotation of the cervical spine had limited flexion extension without discomfort.  Thoracic kyphosis was noted.  He had no tenderness over thoracic or lumbar spine.  Shoulder joint abduction was limited to about  90 degrees bilaterally.  He had limited internal rotation created contractures in his bilateral elbows without any synovitis.  He had limited range of motion of bilateral wrist joints without synovitis.  He had synovial thickening over bilateral MCP joints with swan-neck deformities at the PIP joints.  No synovitis was noted.  Hip joints could not be assessed in the seated position.  Knee joints were replaced and were in good range of motion with some limitation in extension.  There was no tenderness over her ankles or MTPs. Bilateral pedal edema was noted.  CDAI Exam: CDAI Score: -- Patient Global: --; Provider Global: -- Swollen: --; Tender: -- Joint Exam 03/29/2024   No joint exam has been documented for this visit   There is currently no information documented on the homunculus. Go to the Rheumatology activity and complete the homunculus joint exam.  Investigation: No additional findings.  Imaging: No results found.  Recent Labs: Lab Results  Component Value Date    WBC 7.4 12/04/2023   HGB 10.3 (L) 12/04/2023   PLT 174 12/04/2023   NA 138 03/14/2024   K 5.1 03/14/2024   CL 101 03/14/2024   CO2 23 03/14/2024   GLUCOSE 101 (H) 03/14/2024   BUN 20 03/14/2024   CREATININE 0.71 (L) 03/14/2024   BILITOT 0.6 12/04/2023   ALKPHOS 69 04/02/2023   AST 17 12/04/2023   ALT 11 12/04/2023   PROT 6.4 12/04/2023   ALBUMIN 4.0 04/02/2023   CALCIUM  9.2 03/14/2024   GFRAA 96 01/14/2021    Speciality Comments: No specialty comments available.  Procedures:  No procedures performed Allergies: Morphine and codeine  and Acyclovir and related   Assessment / Plan:     Visit Diagnoses: Rheumatoid arthritis involving multiple sites with positive rheumatoid factor (HCC) - - Positive rheumatoid factor, positive anti-CCP, severe erosive disease with nodulosis and contractures: Patient had no synovitis on the examination.  He continues to have contractures and limited mobility in multiple joints.  High risk medication use - - Methotrexate  2.5 mg 6 tablets by mouth every 7 days and folic acid  1 mg 2 tablets daily.  December 04, 2023 CBC and CMP was stable with hemoglobin of 10.3.  He was advised to get labs in October and every 3 months to monitor for drug toxicity.  Information reimmunization was placed in the AVS.  He was advised to hold methotrexate  if he develops an infection and resume of the infection resolves.  History of total knee replacement, bilateral-doing well without any synovitis.  Pain in both feet-he has intermittent discomfort in his feet.  He had some pedal edema.  No synovitis was noted.  Chronic pain syndrome-he takes Tylenol  as needed.  Prediabetes  History of hyperlipidemia  History of melanoma-followed by dermatology.  History of anemia-chronic anemia  History of gastroesophageal reflux (GERD)-he had problems with dysphagia.  He reports having esophageal dilation.  He states he is eating better.  History of basal cell  carcinoma  Bryan Maldonado is still going through the grieving process after losing his wife.  He gets help from his son and daughter-in-law.  He also has a friend who helps him over the weekends.  Orders: Orders Placed This Encounter  Procedures   CBC with Differential/Platelet   Comprehensive metabolic panel with GFR   No orders of the defined types were placed in this encounter.    Follow-Up Instructions: Return in about 5 months (around 08/29/2024) for Rheumatoid arthritis.   Maya Nash, MD  Note - This record has  been created using [employer].  Chart creation errors have been sought, but may not always  have been located. Such creation errors do not reflect on  the standard of medical care.

## 2024-03-16 NOTE — Progress Notes (Signed)
 Letter sent.

## 2024-03-22 ENCOUNTER — Ambulatory Visit (HOSPITAL_COMMUNITY)
Admission: RE | Admit: 2024-03-22 | Discharge: 2024-03-22 | Disposition: A | Discharge status: Home / Self Care | Attending: Internal Medicine | Admitting: Internal Medicine

## 2024-03-22 ENCOUNTER — Encounter (HOSPITAL_COMMUNITY)
Admission: RE | Disposition: A | Discharge status: Home / Self Care | Payer: Self-pay | Source: Home / Self Care | Attending: Internal Medicine

## 2024-03-22 ENCOUNTER — Ambulatory Visit (HOSPITAL_COMMUNITY): Payer: Self-pay | Admitting: Anesthesiology

## 2024-03-22 ENCOUNTER — Encounter (HOSPITAL_COMMUNITY): Payer: Self-pay | Admitting: Anesthesiology

## 2024-03-22 ENCOUNTER — Other Ambulatory Visit: Payer: Self-pay

## 2024-03-22 ENCOUNTER — Encounter (HOSPITAL_COMMUNITY): Payer: Self-pay | Admitting: Internal Medicine

## 2024-03-22 DIAGNOSIS — I10 Essential (primary) hypertension: Secondary | ICD-10-CM | POA: Insufficient documentation

## 2024-03-22 DIAGNOSIS — K222 Esophageal obstruction: Secondary | ICD-10-CM | POA: Diagnosis not present

## 2024-03-22 DIAGNOSIS — K209 Esophagitis, unspecified without bleeding: Secondary | ICD-10-CM

## 2024-03-22 DIAGNOSIS — K2970 Gastritis, unspecified, without bleeding: Secondary | ICD-10-CM

## 2024-03-22 DIAGNOSIS — D649 Anemia, unspecified: Secondary | ICD-10-CM | POA: Diagnosis not present

## 2024-03-22 DIAGNOSIS — K3189 Other diseases of stomach and duodenum: Secondary | ICD-10-CM | POA: Diagnosis not present

## 2024-03-22 DIAGNOSIS — R1310 Dysphagia, unspecified: Secondary | ICD-10-CM | POA: Insufficient documentation

## 2024-03-22 DIAGNOSIS — Z79899 Other long term (current) drug therapy: Secondary | ICD-10-CM | POA: Diagnosis not present

## 2024-03-22 DIAGNOSIS — Z800 Family history of malignant neoplasm of digestive organs: Secondary | ICD-10-CM | POA: Insufficient documentation

## 2024-03-22 DIAGNOSIS — Z9889 Other specified postprocedural states: Secondary | ICD-10-CM | POA: Insufficient documentation

## 2024-03-22 DIAGNOSIS — I739 Peripheral vascular disease, unspecified: Secondary | ICD-10-CM | POA: Diagnosis not present

## 2024-03-22 DIAGNOSIS — K2100 Gastro-esophageal reflux disease with esophagitis, without bleeding: Secondary | ICD-10-CM | POA: Insufficient documentation

## 2024-03-22 HISTORY — PX: ESOPHAGEAL DILATION: SHX303

## 2024-03-22 HISTORY — PX: ESOPHAGOGASTRODUODENOSCOPY: SHX5428

## 2024-03-22 SURGERY — EGD (ESOPHAGOGASTRODUODENOSCOPY)
Anesthesia: General

## 2024-03-22 MED ORDER — LACTATED RINGERS IV SOLN: INTRAVENOUS | Status: DC

## 2024-03-22 MED ORDER — LIDOCAINE 2% (20 MG/ML) 5 ML SYRINGE
INTRAMUSCULAR | Status: DC | PRN
Start: 2024-03-22 — End: 2024-03-22
  Administered 2024-03-22: 60 mg via INTRAVENOUS

## 2024-03-22 MED ORDER — PANTOPRAZOLE SODIUM 40 MG PO TBEC: 40.0000 mg | DELAYED_RELEASE_TABLET | Freq: Two times a day (BID) | ORAL | 11 refills | Status: AC

## 2024-03-22 MED ORDER — PROPOFOL 10 MG/ML IV BOLUS
INTRAVENOUS | Status: DC | PRN
  Administered 2024-03-22: 80 mg via INTRAVENOUS
  Administered 2024-03-22 (×2): 30 mg via INTRAVENOUS

## 2024-03-22 NOTE — Op Note (Signed)
 [facility] Patient Name: Bryan Maldonado Procedure Date: 03/22/2024 1:25 PM MRN: [id number] Date of Birth: 07/16/1941 Attending MD: Carlin POUR. Cindie , [place], [id number] CSN: [id number] Age: 83 Admit Type: Outpatient Procedure:                Upper GI endoscopy Indications:              Dysphagia, Heartburn Providers:                Carlin POUR. Cindie, DO, Harlene Lips, Daphne Mulch Technician, Technician Referring MD:              Medicines:                See the Anesthesia note for documentation of the                            administered medications Complications:            No immediate complications. Estimated Blood Loss:     Estimated blood loss was minimal. Procedure:                Pre-Anesthesia Assessment:                           - The anesthesia plan was to use monitored                            anesthesia care (MAC).                           After obtaining informed consent, the endoscope was                            passed under direct vision. Throughout the                            procedure, the patient's blood pressure, pulse, and                            oxygen saturations were monitored continuously. The                            GIF-H190 ([id number]) scope was introduced through the                            mouth, and advanced to the second part of duodenum.                            The upper GI endoscopy was accomplished without                            difficulty. The patient tolerated the procedure                            well. Scope In: 1:36:43 PM  Scope Out: 1:42:58 PM Total Procedure Duration: 0 hours 6 minutes 15 seconds  Findings:      LA Grade C (one or more mucosal breaks continuous between tops of 2 or       more mucosal folds, less than 75% circumference) esophagitis with no       bleeding was found 30 to 40 cm from the incisors. Biopsies were taken       with a cold forceps for histology.      A  mild Schatzki ring was found in the distal esophagus. Not dilated today      Diffuse moderate inflammation characterized by adherent blood,       congestion (edema), erosions and erythema was found in the entire       examined stomach. Biopsies were taken with a cold forceps for       Helicobacter pylori testing.      The duodenal bulb, first portion of the duodenum and second portion of       the duodenum were normal. Impression:               - LA Grade C reflux esophagitis with no bleeding.                            Biopsied.                           - Mild Schatzki ring.                           - Gastritis. Biopsied.                           - Normal duodenal bulb, first portion of the                            duodenum and second portion of the duodenum. Moderate Sedation:      Per Anesthesia Care Recommendation:           - Patient has a contact number available for                            emergencies. The signs and symptoms of potential                            delayed complications were discussed with the                            patient. Return to normal activities tomorrow.                            Written discharge instructions were provided to the                            patient.                           - Resume previous diet.                           -  Continue present medications.                           - Await pathology results.                           - Repeat upper endoscopy in 10-12 weeks to evaluate                            the response to therapy and pursue dilation if                            amenable.                           - Return to GI clinic in 6 weeks.                           - Use Protonix  (pantoprazole ) 40 mg PO BID.                           - No ibuprofen, naproxen, or other non-steroidal                            anti-inflammatory drugs. Procedure Code(s):        --- Professional ---                           [phone],  Esophagogastroduodenoscopy, flexible,                            transoral; with biopsy, single or multiple Diagnosis Code(s):        --- Professional ---                           K21.00, Gastro-esophageal reflux disease with                            esophagitis, without bleeding                           K22.2, Esophageal obstruction                           K29.70, Gastritis, unspecified, without bleeding                           R13.10, Dysphagia, unspecified                           R12, Heartburn CPT copyright 2022 American Medical Association. All rights reserved. The codes documented in this report are preliminary and upon coder review may  be revised to meet current compliance requirements. Carlin POUR. Cindie, DO Carlin POUR. Cindie, DO 03/22/2024 1:47:21 PM This report has been signed electronically. Number of Addenda: 0

## 2024-03-22 NOTE — Anesthesia Preprocedure Evaluation (Signed)
 Anesthesia Evaluation  Patient identified by MRN, date of birth, ID band Patient awake    Reviewed: Allergy & Precautions, H&P , NPO status , Patient's Chart, lab work & pertinent test results, reviewed documented beta blocker date and time   Airway Mallampati: II  TM Distance: >3 FB Neck ROM: full    Dental no notable dental hx.    Pulmonary neg pulmonary ROS   Pulmonary exam normal breath sounds clear to auscultation       Cardiovascular Exercise Tolerance: Good hypertension, + Peripheral Vascular Disease  negative cardio ROS  Rhythm:regular Rate:Normal     Neuro/Psych negative neurological ROS  negative psych ROS   GI/Hepatic negative GI ROS, Neg liver ROS,GERD  ,,  Endo/Other  negative endocrine ROS    Renal/GU negative Renal ROS  negative genitourinary   Musculoskeletal   Abdominal   Peds  Hematology negative hematology ROS (+) Blood dyscrasia, anemia   Anesthesia Other Findings   Reproductive/Obstetrics negative OB ROS                              Anesthesia Physical Anesthesia Plan  ASA: 3  Anesthesia Plan: General   Post-op Pain Management:    Induction:   PONV Risk Score and Plan: Propofol  infusion  Airway Management Planned:   Additional Equipment:   Intra-op Plan:   Post-operative Plan:   Informed Consent: I have reviewed the patients History and Physical, chart, labs and discussed the procedure including the risks, benefits and alternatives for the proposed anesthesia with the patient or authorized representative who has indicated his/her understanding and acceptance.     Dental Advisory Given  Plan Discussed with: CRNA  Anesthesia Plan Comments:         Anesthesia Quick Evaluation

## 2024-03-22 NOTE — Discharge Instructions (Addendum)
 EGD Discharge instructions Please read the instructions outlined below and refer to this sheet in the next few weeks. These discharge instructions provide you with general information on caring for yourself after you leave the hospital. Your doctor may also give you specific instructions. While your treatment has been planned according to the most current medical practices available, unavoidable complications occasionally occur. If you have any problems or questions after discharge, please call your doctor. ACTIVITY You may resume your regular activity but move at a slower pace for the next 24 hours.  Take frequent rest periods for the next 24 hours.  Walking will help expel (get rid of) the air and reduce the bloated feeling in your abdomen.  No driving for 24 hours (because of the anesthesia (medicine) used during the test).  You may shower.  Do not sign any important legal documents or operate any machinery for 24 hours (because of the anesthesia used during the test).  NUTRITION Drink plenty of fluids.  You may resume your normal diet.  Begin with a light meal and progress to your normal diet.  Avoid alcoholic beverages for 24 hours or as instructed by your caregiver.  MEDICATIONS You may resume your normal medications unless your caregiver tells you otherwise.  WHAT YOU CAN EXPECT TODAY You may experience abdominal discomfort such as a feeling of fullness or "gas" pains.  FOLLOW-UP Your doctor will discuss the results of your test with you.  SEEK IMMEDIATE MEDICAL ATTENTION IF ANY OF THE FOLLOWING OCCUR: Excessive nausea (feeling sick to your stomach) and/or vomiting.  Severe abdominal pain and distention (swelling).  Trouble swallowing.  Temperature over 101 F (37.8 C).  Rectal bleeding or vomiting of blood.   Your upper endoscopy revealed significant inflammation/esophagitis in your esophagus.  This is likely due to uncontrolled reflux.  I took biopsies today.  Will call with  results.  You also have significant inflammation in your stomach as well.  I took biopsies of this to rule out infection with a bacteria called H. pylori.  Await pathology results, my office will contact you.  Small bowel looked normal.  I am going to increase your pantoprazole  to twice daily.  Limit NSAID use as best as you can.  Follow-up in GI office in 6 to 8 weeks. Message sent to office.  Recommend repeat upper endoscopy in 12 weeks at which point we can hopefully stretch your esophagus safely.    I hope you have a great rest of your week!  Carlin POUR. Cindie, D.O. Gastroenterology and Hepatology [facility]

## 2024-03-22 NOTE — Interval H&P Note (Signed)
 History and Physical Interval Note:  03/22/2024 12:54 PM  Bryan A Veasey Sr.  has presented today for surgery, with the diagnosis of anemia,dysphagia.  The various methods of treatment have been discussed with the patient and family. After consideration of risks, benefits and other options for treatment, the patient has consented to  Procedure(s) with comments: EGD (ESOPHAGOGASTRODUODENOSCOPY) (N/A) - 1:15 pm, asa 2 DILATION, ESOPHAGUS (N/A) as a surgical intervention.  The patient's history has been reviewed, patient examined, no change in status, stable for surgery.  I have reviewed the patient's chart and labs.  Questions were answered to the patient's satisfaction.     Carlin MARLA Hasty

## 2024-03-22 NOTE — Transfer of Care (Signed)
 Immediate Anesthesia Transfer of Care Note  Patient: Bryan LABOR Esh Sr.  Procedure(s) Performed: EGD (ESOPHAGOGASTRODUODENOSCOPY) DILATION, ESOPHAGUS  Patient Location: Endoscopy Unit  Anesthesia Type:General  Level of Consciousness: drowsy  Airway & Oxygen Therapy: Patient Spontanous Breathing  Post-op Assessment: Report given to RN and Post -op Vital signs reviewed and stable  Post vital signs: Reviewed and stable  Last Vitals:  Vitals Value Taken Time  BP 101/61 03/22/24 13:45  Temp 36.4 C 03/22/24 13:45  Pulse 48 03/22/24 13:45  Resp 10 03/22/24 13:45  SpO2 99 % 03/22/24 13:45    Last Pain:  Vitals:   03/22/24 1345  TempSrc: Oral  PainSc:       Patients Stated Pain Goal: 3 (03/22/24 1208)  Complications: No notable events documented.

## 2024-03-23 ENCOUNTER — Encounter (HOSPITAL_COMMUNITY): Payer: Self-pay | Admitting: Internal Medicine

## 2024-03-23 LAB — SURGICAL PATHOLOGY

## 2024-03-26 NOTE — Anesthesia Postprocedure Evaluation (Signed)
 Anesthesia Post Note  Patient: Bryan LABOR Prophete Sr.  Procedure(s) Performed: EGD (ESOPHAGOGASTRODUODENOSCOPY) DILATION, ESOPHAGUS  Patient location during evaluation: Phase II Anesthesia Type: General Level of consciousness: awake Pain management: pain level controlled Vital Signs Assessment: post-procedure vital signs reviewed and stable Respiratory status: spontaneous breathing and respiratory function stable Cardiovascular status: blood pressure returned to baseline and stable Postop Assessment: no headache and no apparent nausea or vomiting Anesthetic complications: no Comments: Late entry   No notable events documented.   Last Vitals:  Vitals:   03/22/24 1345 03/22/24 1348  BP: 101/61 (!) 102/52  Pulse: (!) 48 (!) 45  Resp: 10 (!) 9  Temp: 36.4 C   SpO2: 99% 97%    Last Pain:  Vitals:   03/22/24 1348  TempSrc:   PainSc: 0-No pain                 Bryan Maldonado

## 2024-03-29 ENCOUNTER — Ambulatory Visit: Payer: PPO | Attending: Rheumatology | Admitting: Rheumatology

## 2024-03-29 ENCOUNTER — Encounter: Payer: Self-pay | Admitting: Rheumatology

## 2024-03-29 VITALS — BP 124/59 | HR 55 | Resp 17 | Ht 65.0 in | Wt 185.0 lb

## 2024-03-29 DIAGNOSIS — G894 Chronic pain syndrome: Secondary | ICD-10-CM | POA: Diagnosis not present

## 2024-03-29 DIAGNOSIS — M79671 Pain in right foot: Secondary | ICD-10-CM | POA: Diagnosis not present

## 2024-03-29 DIAGNOSIS — F4321 Adjustment disorder with depressed mood: Secondary | ICD-10-CM

## 2024-03-29 DIAGNOSIS — Z79899 Other long term (current) drug therapy: Secondary | ICD-10-CM

## 2024-03-29 DIAGNOSIS — Z8639 Personal history of other endocrine, nutritional and metabolic disease: Secondary | ICD-10-CM | POA: Diagnosis not present

## 2024-03-29 DIAGNOSIS — R7303 Prediabetes: Secondary | ICD-10-CM

## 2024-03-29 DIAGNOSIS — Z862 Personal history of diseases of the blood and blood-forming organs and certain disorders involving the immune mechanism: Secondary | ICD-10-CM | POA: Diagnosis not present

## 2024-03-29 DIAGNOSIS — M0579 Rheumatoid arthritis with rheumatoid factor of multiple sites without organ or systems involvement: Secondary | ICD-10-CM | POA: Diagnosis not present

## 2024-03-29 DIAGNOSIS — M79672 Pain in left foot: Secondary | ICD-10-CM

## 2024-03-29 DIAGNOSIS — Z85820 Personal history of malignant melanoma of skin: Secondary | ICD-10-CM

## 2024-03-29 DIAGNOSIS — Z8719 Personal history of other diseases of the digestive system: Secondary | ICD-10-CM

## 2024-03-29 DIAGNOSIS — Z96653 Presence of artificial knee joint, bilateral: Secondary | ICD-10-CM

## 2024-03-29 DIAGNOSIS — Z85828 Personal history of other malignant neoplasm of skin: Secondary | ICD-10-CM | POA: Diagnosis not present

## 2024-03-29 NOTE — Patient Instructions (Signed)
 Standing Labs We placed an order today for your standing lab work.   Please have your standing labs drawn in  October and every 3 months  Please have your labs drawn 2 weeks prior to your appointment so that the provider can discuss your lab results at your appointment, if possible.  Please note that you may see your imaging and lab results in MyChart before we have reviewed them. We will contact you once all results are reviewed. Please allow our office up to 72 hours to thoroughly review all of the results before contacting the office for clarification of your results.  WALK-IN LAB HOURS  Monday through Thursday from 8:00 am -12:30 pm and 1:00 pm-4:30 pm and Friday from 8:00 am-12:00 pm.  Patients with office visits requiring labs will be seen before walk-in labs.  You may encounter longer than normal wait times. Please allow additional time. Wait times may be shorter on  Monday and Thursday afternoons.  We do not book appointments for walk-in labs. We appreciate your patience and understanding with our staff.   Labs are drawn by [facility]. Please bring your co-pay at the time of your lab draw.  You may receive a bill from [facility] for your lab work.  Please note if you are on Hydroxychloroquine and and an order has been placed for a Hydroxychloroquine level,  you will need to have it drawn 4 hours or more after your last dose.  If you wish to have your labs drawn at another location, please call the office 24 hours in advance so we can fax the orders.  The office is located at [address], [place], [place] [address]   If you have any questions regarding directions or hours of operation,  please call [phone].   As a reminder, please drink plenty of water  prior to coming for your lab work. Thanks!  Vaccines You are taking a medication(s) that can suppress your immune system.  The following immunizations are recommended: Flu annually Covid-19  Td/Tdap (tetanus,  diphtheria, pertussis) every 10 years Pneumonia (Prevnar 15 then Pneumovax 23 at least 1 year apart.  Alternatively, can take Prevnar 20 without needing additional dose) Shingrix: 2 doses from 4 weeks to 6 months apart  Please check with your PCP to make sure you are up to date.   If you have signs or symptoms of an infection or start antibiotics: First, call your PCP for workup of your infection. Hold your medication through the infection, until you complete your antibiotics, and until symptoms resolve if you take the following: Injectable medication (Actemra, Benlysta, Cimzia, Cosentyx, Enbrel, Humira, Kevzara, Orencia , Remicade, Simponi, Stelara, Taltz, Tremfya) Methotrexate  Leflunomide  (Arava ) Mycophenolate (Cellcept) Xeljanz, Olumiant, or Rinvoq

## 2024-03-30 ENCOUNTER — Ambulatory Visit: Payer: Self-pay | Admitting: Rheumatology

## 2024-03-30 LAB — CBC WITH DIFFERENTIAL/PLATELET
Absolute Lymphocytes: 2833 {cells}/uL (ref 850–3900)
Absolute Monocytes: 465 {cells}/uL (ref 200–950)
Basophils Absolute: 19 {cells}/uL (ref 0–200)
Basophils Relative: 0.3 %
Eosinophils Absolute: 143 {cells}/uL (ref 15–500)
Eosinophils Relative: 2.3 %
HCT: 31.6 % — ABNORMAL LOW (ref 38.5–50.0)
Hemoglobin: 10.5 g/dL — ABNORMAL LOW (ref 13.2–17.1)
MCH: 33.5 pg — ABNORMAL HIGH (ref 27.0–33.0)
MCHC: 33.2 g/dL (ref 32.0–36.0)
MCV: 101 fL — ABNORMAL HIGH (ref 80.0–100.0)
MPV: 9.4 fL (ref 7.5–12.5)
Monocytes Relative: 7.5 %
Neutro Abs: 2740 {cells}/uL (ref 1500–7800)
Neutrophils Relative %: 44.2 %
Platelets: 190 Thousand/uL (ref 140–400)
RBC: 3.13 Million/uL — ABNORMAL LOW (ref 4.20–5.80)
RDW: 14.6 % (ref 11.0–15.0)
Total Lymphocyte: 45.7 %
WBC: 6.2 Thousand/uL (ref 3.8–10.8)

## 2024-03-30 LAB — COMPREHENSIVE METABOLIC PANEL WITH GFR
AG Ratio: 1.6 (calc) (ref 1.0–2.5)
ALT: 20 U/L (ref 9–46)
AST: 24 U/L (ref 10–35)
Albumin: 4.1 g/dL (ref 3.6–5.1)
Alkaline phosphatase (APISO): 79 U/L (ref 35–144)
BUN: 20 mg/dL (ref 7–25)
CO2: 26 mmol/L (ref 20–32)
Calcium: 8.8 mg/dL (ref 8.6–10.3)
Chloride: 103 mmol/L (ref 98–110)
Creat: 0.87 mg/dL (ref 0.70–1.22)
Globulin: 2.5 g/dL (ref 1.9–3.7)
Glucose, Bld: 99 mg/dL (ref 65–99)
Potassium: 4.7 mmol/L (ref 3.5–5.3)
Sodium: 137 mmol/L (ref 135–146)
Total Bilirubin: 0.8 mg/dL (ref 0.2–1.2)
Total Protein: 6.6 g/dL (ref 6.1–8.1)
eGFR: 86 mL/min/1.73m2 (ref >=60)

## 2024-03-30 NOTE — Progress Notes (Signed)
 Hemoglobin is low and stable, CMP is normal.  Please forward results to his PCP.

## 2024-04-01 ENCOUNTER — Ambulatory Visit: Payer: Self-pay | Admitting: Internal Medicine

## 2024-04-01 ENCOUNTER — Other Ambulatory Visit: Payer: Self-pay | Admitting: Internal Medicine

## 2024-04-01 MED ORDER — FLUCONAZOLE 200 MG PO TABS
ORAL_TABLET | ORAL | 0 refills | Status: DC
Start: 2024-04-01 — End: 2024-05-10

## 2024-05-05 DIAGNOSIS — M79674 Pain in right toe(s): Secondary | ICD-10-CM | POA: Diagnosis not present

## 2024-05-05 DIAGNOSIS — M79675 Pain in left toe(s): Secondary | ICD-10-CM | POA: Diagnosis not present

## 2024-05-05 DIAGNOSIS — B351 Tinea unguium: Secondary | ICD-10-CM | POA: Diagnosis not present

## 2024-05-08 ENCOUNTER — Other Ambulatory Visit: Payer: Self-pay | Admitting: Family Medicine

## 2024-05-10 ENCOUNTER — Ambulatory Visit (INDEPENDENT_AMBULATORY_CARE_PROVIDER_SITE_OTHER): Admitting: Family Medicine

## 2024-05-10 ENCOUNTER — Encounter: Payer: Self-pay | Admitting: Family Medicine

## 2024-05-10 VITALS — BP 122/77 | HR 71 | Temp 98.1°F | Ht 65.0 in | Wt 188.0 lb

## 2024-05-10 DIAGNOSIS — F324 Major depressive disorder, single episode, in partial remission: Secondary | ICD-10-CM

## 2024-05-10 DIAGNOSIS — Z23 Encounter for immunization: Secondary | ICD-10-CM

## 2024-05-10 DIAGNOSIS — E119 Type 2 diabetes mellitus without complications: Secondary | ICD-10-CM

## 2024-05-10 DIAGNOSIS — Z79899 Other long term (current) drug therapy: Secondary | ICD-10-CM

## 2024-05-10 DIAGNOSIS — E785 Hyperlipidemia, unspecified: Secondary | ICD-10-CM

## 2024-05-10 DIAGNOSIS — E1169 Type 2 diabetes mellitus with other specified complication: Secondary | ICD-10-CM

## 2024-05-10 DIAGNOSIS — R600 Localized edema: Secondary | ICD-10-CM

## 2024-05-10 NOTE — Progress Notes (Signed)
 Subjective:    Patient ID: Bryan DELENA Alexanders Sr., male    DOB: 25-Aug-1941, 83 y.o.   MRN: [id number]  HPI follow up  Hyperlipidemia tries to watch diet takes his medicine regular basis denies any major setbacks tolerates medicine well  Prediabetes-tries to be healthy with the diet but he is somewhat limited on finances which makes it hard to eat healthy on a regular basis  Anemia-more than likely related to rheumatoid arthritis Recent iron  studies looked reasonable Not taking iron  currently Was taking it twice a week  BPH-takes tamsulosin  on a regular basis tries to keep his urinary flow going okay he states he goes frequently but states the flow is fair denies hematuria or blood  RA-takes methotrexate -deals with macrocytic anemia-has anemia of chronic disease as well Tries to maintain a healthy diet Relates a lot of severe pain discomfort In addition to this has severe debilitation of his hands and joints from rheumatoid  Patient with pedal edema which has been more severe recently in the lower legs he does a lot of sitting with some walking.  Not as active as he used to be he does take fluid pill although has been cut back to half a tablet recently     05/10/2024    1:46 PM 12/09/2023    2:34 PM 10/05/2023   10:49 AM 05/26/2023    4:59 PM 01/15/2023    9:09 AM  Depression screen PHQ 2/9  Decreased Interest 1 2 0 0 0  Down, Depressed, Hopeless 2 2 0 0 1  PHQ - 2 Score 3 4 0 0 1  Altered sleeping 3 2 1  1   Tired, decreased energy 2 1 0  1  Change in appetite 1 0 1  0  Feeling bad or failure about yourself  1 1 0  0  Trouble concentrating 0 1 1  1   Moving slowly or fidgety/restless 0 1 1  0  Suicidal thoughts 0 0 0  0  PHQ-9 Score 10 10 4  4   Difficult doing work/chores Somewhat difficult Somewhat difficult Somewhat difficult  Not difficult at all   Gad    05/10/2024    1:47 PM 12/09/2023    2:34 PM 10/05/2023   10:49 AM 09/05/2022    1:30 PM  GAD 7 : Generalized Anxiety Score   Nervous, Anxious, on Edge 1 1 1 1   Control/stop worrying 2 0 1 2  Worry too much - different things 1 1 1 2   Trouble relaxing 2 1 1 1   Restless 1 0 1 2  Easily annoyed or irritable 1 1 1 2   Afraid - awful might happen 2 1 1 2   Total GAD 7 Score 10 5 7 12   Anxiety Difficulty Somewhat difficult Somewhat difficult Somewhat difficult Very difficult    Patient is grieving the loss of his wife plus also the struggles of being by himself but he does have family members that help out   Review of Systems     Objective:   Physical Exam  General-in no acute distress Eyes-no discharge Lungs-respiratory rate normal, CTA CV-no murmurs,RRR Extremities skin warm dry moderate lower leg no tenderness edema Neuro grossly normal Behavior normal, alert  Results for orders placed or performed in visit on 03/29/24  CBC with Differential/Platelet   Collection Time: 03/29/24  1:39 PM  Result Value Ref Range   WBC 6.2 3.8 - 10.8 Thousand/uL   RBC 3.13 (L) 4.20 - 5.80 Million/uL   Hemoglobin 10.5 (  L) 13.2 - 17.1 g/dL   HCT 68.3 (L) 61.4 - 49.9 %   MCV 101.0 (H) 80.0 - 100.0 fL   MCH 33.5 (H) 27.0 - 33.0 pg   MCHC 33.2 32.0 - 36.0 g/dL   RDW 85.3 88.9 - 84.9 %   Platelets 190 140 - 400 Thousand/uL   MPV 9.4 7.5 - 12.5 fL   Neutro Abs 2,740 1,500 - 7,800 cells/uL   Absolute Lymphocytes 2,833 850 - 3,900 cells/uL   Absolute Monocytes 465 200 - 950 cells/uL   Eosinophils Absolute 143 15 - 500 cells/uL   Basophils Absolute 19 0 - 200 cells/uL   Neutrophils Relative % 44.2 %   Total Lymphocyte 45.7 %   Monocytes Relative 7.5 %   Eosinophils Relative 2.3 %   Basophils Relative 0.3 %  Comprehensive metabolic panel with GFR   Collection Time: 03/29/24  1:39 PM  Result Value Ref Range   Glucose, Bld 99 65 - 99 mg/dL   BUN 20 7 - 25 mg/dL   Creat 9.12 9.29 - 8.77 mg/dL   eGFR 86 > OR = 60 fO/fpw/8.26f7   BUN/Creatinine Ratio SEE NOTE: 6 - 22 (calc)   Sodium 137 135 - 146 mmol/L   Potassium  4.7 3.5 - 5.3 mmol/L   Chloride 103 98 - 110 mmol/L   CO2 26 20 - 32 mmol/L   Calcium  8.8 8.6 - 10.3 mg/dL   Total Protein 6.6 6.1 - 8.1 g/dL   Albumin 4.1 3.6 - 5.1 g/dL   Globulin 2.5 1.9 - 3.7 g/dL (calc)   AG Ratio 1.6 1.0 - 2.5 (calc)   Total Bilirubin 0.8 0.2 - 1.2 mg/dL   Alkaline phosphatase (APISO) 79 35 - 144 U/L   AST 24 10 - 35 U/L   ALT 20 9 - 46 U/L        Assessment & Plan:  1. Pedal edema (Primary) Moderate swelling in the lower legs bump up the dose of the diuretic to a full tablet along with the potassium on a daily basis check metabolic 7 in 3 weeks recommend follow-up visit in 5 months- Basic metabolic panel with GFR  2. Diabetes mellitus without complication (HCC) For now I do not want to add medication but it would be important for this patient to try to be the best he can with a healthy diet and minimizing carbohydrates and sugars.  Certainly if A1c above rises above 7 we will need to do a different approach  3. Hyperlipidemia associated with type 2 diabetes mellitus (HCC) Cholesterol under good control continue current measures  4. High risk medication use Lab work ordered await results - Basic metabolic panel with GFR  5. Immunization due Flu shot today - Flu vaccine HIGH DOSE PF(Fluzone Trivalent)  6. Depression, major, single episode, in partial remission (HCC) Does take his antidepressant he is trying to cope with the loss of his wife  As stated above follow-up 5 months but do lab work in 3 weeks report to us  if the swelling is not improving with the changes above

## 2024-05-24 ENCOUNTER — Ambulatory Visit (INDEPENDENT_AMBULATORY_CARE_PROVIDER_SITE_OTHER): Admitting: Gastroenterology

## 2024-05-24 ENCOUNTER — Telehealth: Payer: Self-pay | Admitting: *Deleted

## 2024-05-24 ENCOUNTER — Other Ambulatory Visit: Payer: Self-pay

## 2024-05-24 ENCOUNTER — Encounter: Payer: Self-pay | Admitting: Gastroenterology

## 2024-05-24 VITALS — BP 120/74 | HR 78 | Temp 97.2°F | Ht 65.0 in | Wt 180.1 lb

## 2024-05-24 DIAGNOSIS — D638 Anemia in other chronic diseases classified elsewhere: Secondary | ICD-10-CM

## 2024-05-24 DIAGNOSIS — K2100 Gastro-esophageal reflux disease with esophagitis, without bleeding: Secondary | ICD-10-CM

## 2024-05-24 DIAGNOSIS — K219 Gastro-esophageal reflux disease without esophagitis: Secondary | ICD-10-CM | POA: Insufficient documentation

## 2024-05-24 DIAGNOSIS — R1310 Dysphagia, unspecified: Secondary | ICD-10-CM | POA: Insufficient documentation

## 2024-05-24 MED ORDER — METHOTREXATE SODIUM 2.5 MG PO TABS: 15.0000 mg | ORAL_TABLET | ORAL | 0 refills | Status: DC

## 2024-05-24 NOTE — Telephone Encounter (Signed)
 Spoke with daughter in law and schedule patient for 10/8. Aware will mail instructions and advised they will get a pre-op phone call with arrival time. Daughter in law also requested she be called for the pre-op phone called.

## 2024-05-24 NOTE — H&P (View-Only) (Signed)
 Gastroenterology Office Note     Primary Care Physician:  Alphonsa Glendia LABOR, MD  Primary Gastroenterologist: Dr. Cindie    Chief Complaint   Chief Complaint  Patient presents with   Follow-up    Pt arrives for follow up. Pt states everything is going alright except pt wants to sleep all the time. Pt is still taking Protonix  BID.      History of Present Illness   Bryan KEAY Sr. is an 83 y.o. male presenting today with a history of chronic anemia multifactorial in setting of chronic disease and component of IDA, baseline Hgb 10-12, heme positive stool s/p colonoscopy April 2024, strong family history of colon cancer in 2 first-degree relatives, recently undergoing EGD due to dysphagia and here for follow-up.  EGD with LA Grade C reflux esophagitis, Schatzki ring not manipulated, +candida. He will need repeat EGD to reassess healing and dilate as appropriate.  Today:  No overt GI bleeding. Took course of Diflucan  for candida esophagitis. Still with intermittent solid food dysphagia but better. No abdominal pain. Continues on PPI BID. No N/V. No bloody stools. Has no other concerns today.     EGD July 2025: LA Grade C reflux esophagitis without bleeding s/p biopsy, mild Schatzki ring not dilated, gastritis s/p biopsy, normal appearing duodenum. Path: negative H.pylori. +Candida. Negative EOE.   Colonoscopy April 2024: non-bleeding hemorrhoids, diverticulosis, five 3-6 mm polyps in ascending and cecum, one 4 mm polyp in descending colon. Path: tubular adenomas. No repeat colon due to age.    Past Medical History:  Diagnosis Date   Anemia    Basal cell carcinoma    BPH (benign prostatic hyperplasia)    GERD (gastroesophageal reflux disease)    Melanoma (HCC)    Osteoarthritis    Rheumatoid aortitis    Rheumatoid arthritis [facility])     Past Surgical History:  Procedure Laterality Date   CATARACT EXTRACTION W/PHACO Right 03/12/2020   Procedure: CATARACT EXTRACTION PHACO AND  INTRAOCULAR LENS PLACEMENT RIGHT EYE;  Surgeon: Harrie Agent, MD;  Location: [facility];  Service: Ophthalmology;  Laterality: Right;  CDE: 9.99   CATARACT EXTRACTION W/PHACO Left 03/26/2020   Procedure: CATARACT EXTRACTION PHACO AND INTRAOCULAR LENS PLACEMENT (IOC);  Surgeon: Harrie Agent, MD;  Location: [facility];  Service: Ophthalmology;  Laterality: Left;  CDE: 9.16    COLONOSCOPY     COLONOSCOPY WITH PROPOFOL  N/A 12/22/2022   Procedure: COLONOSCOPY WITH PROPOFOL ;  Surgeon: Cindie Carlin POUR, DO;  [facility];  Service: Endoscopy;  Laterality: N/A;  10:15AM;ASA 3   ESOPHAGEAL DILATION     ESOPHAGEAL DILATION N/A 03/22/2024   Procedure: DILATION, ESOPHAGUS;  Surgeon: Cindie Carlin POUR, DO;  [facility];  Service: Endoscopy;  Laterality: N/A;   ESOPHAGOGASTRODUODENOSCOPY N/A 03/22/2024   Procedure: EGD (ESOPHAGOGASTRODUODENOSCOPY);  Surgeon: Cindie Carlin POUR, DO;  [facility];  Service: Endoscopy;  Laterality: N/A;  1:15 pm, asa 2   HERNIA REPAIR     JOINT REPLACEMENT Bilateral    KNEE ARTHROPLASTY     MELANOMA EXCISION     MELANOMA EXCISION  09/22/2018   on head   POLYPECTOMY  12/22/2022   Procedure: POLYPECTOMY;  Surgeon: Cindie Carlin POUR, DO;  [facility];  Service: Endoscopy;;    Current Outpatient Medications  Medication Sig Dispense Refill   acetaminophen  (TYLENOL ) 650 MG CR tablet Take 1,300 mg by mouth 3 (three) times daily.     atorvastatin  (LIPITOR) 10 MG tablet TAKE  1 TABLET(10 MG) BY MOUTH DAILY 90 tablet 1   B Complex-C (B-COMPLEX WITH VITAMIN C) tablet Take 1 tablet by mouth daily.     CALCIUM  MAGNESIUM ZINC PO Take by mouth daily.     Carboxymethylcellulose Sodium (REFRESH PLUS OP) Apply to eye.     cetirizine (ZYRTEC) 10 MG tablet Take 10 mg by mouth daily.      cholecalciferol (VITAMIN D3) 25 MCG (1000 UNIT) tablet Take 1,000 Units by mouth daily.     docusate sodium (COLACE) 100 MG capsule Take 100 mg by mouth 2 (two)  times daily.     Ferrous Sulfate (IRON ) 325 (65 Fe) MG TABS One on Mondays and one on Fridays 30 tablet 6   FLUoxetine  (PROZAC ) 20 MG capsule Take 1 capsule (20 mg total) by mouth daily. 30 capsule 5   folic acid  (FOLVITE ) 1 MG tablet TAKE 2 TABLETS BY MOUTH DAILY 180 tablet 3   furosemide  (LASIX ) 20 MG tablet Take 20 mg by mouth daily. TAKE 1/2 TABLET(20 MG) BY MOUTH EVERY MORNING AS NEEDED FOR FLUID IN LEGS (Patient taking differently: Take 20 mg by mouth daily. TAKE 1/2 TABLET(20 MG) BY MOUTH EVERY MORNING AS NEEDED FOR FLUID IN LEGS)     Hypromellose (PURE & GENTLE LUBRICANT) 0.3 % SOLN Apply to eye.     methotrexate  (RHEUMATREX) 2.5 MG tablet Take 6 tablets (15 mg total) by mouth once a week. Caution:Chemotherapy. Protect from light. 72 tablet 0   Multiple Vitamins-Minerals (CENTRUM SILVER  PO) Take by mouth.     Multiple Vitamins-Minerals (OCUVITE PRESERVISION PO) Take by mouth.     OVER THE COUNTER MEDICATION Dr. Heriberto foot cream     OVER THE COUNTER MEDICATION Cayenne pepper     pantoprazole  (PROTONIX ) 40 MG tablet Take 1 tablet (40 mg total) by mouth 2 (two) times daily. 60 tablet 11   potassium chloride  SA (KLOR-CON  M) 20 MEQ tablet TAKE ONE TABLET BY MOUTH EVERY MORNING WHEN TAKING LASIX  30 tablet 5   Propylene Glycol (SYSTANE COMPLETE OP) Apply to eye.     tamsulosin  (FLOMAX ) 0.4 MG CAPS capsule TAKE 2 CAPSULES BY MOUTH EVERY EVENING 60 capsule 5   vitamin C (ASCORBIC ACID) 500 MG tablet Take 500 mg by mouth daily.     No current facility-administered medications for this visit.    Allergies as of 05/24/2024 - Review Complete 05/24/2024  Allergen Reaction Noted   Morphine and codeine  Other (See Comments) 01/04/2013   Acyclovir and related  08/27/2017    Family History  Problem Relation Age of Onset   Rheum arthritis Mother    Emphysema Father    Cancer Brother    Emphysema Sister     Social History   Socioeconomic History   Marital status: Married    Spouse name:  Not on file   Number of children: Not on file   Years of education: Not on file   Highest education level: Not on file  Occupational History   Not on file  Tobacco Use   Smoking status: Never    Passive exposure: Current   Smokeless tobacco: Never  Vaping Use   Vaping status: Never Used  Substance and Sexual Activity   Alcohol use: No   Drug use: No   Sexual activity: Not on file  Other Topics Concern   Not on file  Social History Narrative   Not on file   Social Drivers of Health   Financial Resource Strain: Low Risk  (01/08/2021)  Overall Financial Resource Strain (CARDIA)    Difficulty of Paying Living Expenses: Not hard at all  Food Insecurity: No Food Insecurity (05/26/2023)   Hunger Vital Sign    Worried About Running Out of Food in the Last Year: Never true    Ran Out of Food in the Last Year: Never true  Transportation Needs: No Transportation Needs (05/26/2023)   PRAPARE - Administrator, Civil Service (Medical): No    Lack of Transportation (Non-Medical): No  Physical Activity: Inactive (01/08/2021)   Exercise Vital Sign    Days of Exercise per Week: 0 days    Minutes of Exercise per Session: 0 min  Stress: No Stress Concern Present (01/08/2021)   [employer] of Occupational Health - Occupational Stress Questionnaire    Feeling of Stress : Not at all  Social Connections: Moderately Integrated (05/26/2023)   Social Connection and Isolation Panel    Frequency of Communication with Friends and Family: Twice a week    Frequency of Social Gatherings with Friends and Family: More than three times a week    Attends Religious Services: More than 4 times per year    Active Member of [employer] or Organizations: No    Attends Banker Meetings: Never    Marital Status: Married  Catering manager Violence: Not At Risk (05/26/2023)   Humiliation, Afraid, Rape, and Kick questionnaire    Fear of Current or Ex-Partner: No    Emotionally Abused: No     Physically Abused: No    Sexually Abused: No     Review of Systems   Gen: Denies any fever, chills, fatigue, weight loss, lack of appetite.  CV: Denies chest pain, heart palpitations, peripheral edema, syncope.  Resp: Denies shortness of breath at rest or with exertion. Denies wheezing or cough.  GI: Denies dysphagia or odynophagia. Denies jaundice, hematemesis, fecal incontinence. GU : Denies urinary burning, urinary frequency, urinary hesitancy Derm: Denies rash, itching, dry skin Psych: Denies depression, anxiety, memory loss, and confusion Heme: Denies bruising, bleeding, and enlarged lymph nodes.   Physical Exam   BP 120/74   Pulse 78   Temp (!) 97.2 F (36.2 C)   Ht 5' 5 (1.651 m)   Wt 180 lb 1.6 oz (81.7 kg)   BMI 29.97 kg/m  General:   Alert and oriented. Pleasant and cooperative. Well-nourished and well-developed.  Head:  Normocephalic and atraumatic. Eyes:  Without icterus Abdomen:  +BS, soft, non-tender and non-distended.  Rectal:  Deferred  Msk:  arthritic changes to hands, contractures Extremities:  Without edema. Neurologic:  Alert and  oriented x4 Psych:  Alert and cooperative. Normal mood and affect.   Assessment   Bryan MIDDLETON Sr. is an 83 y.o. male presenting today with a history of chronic anemia, GERD, recently found to have Grade C esophagitis, candida, and Schatkzi's ring not manipulated on EGD in July 2025 performed due to dysphagia. Returns today in follow-up.  GERD with esophagitis: now on PPI BID since July 2025. He will need repeat EGD to reassess response to therapy and also dilation of Schatzki's ring as he notes intermittent dysphagia although improved.   Chronic anemia: in setting of chronic disease, chronic methotrexate , iron  deficiency component. Colonoscopy on file April 2024, EGD recently completed. No overt GI bleeding. Hgb at baseline in the 10 range.    Candia: completed course of Diflucan  with improvement.     PLAN    Proceed with upper endoscopy/dilation by Dr. Cindie in near  future: the risks, benefits, and alternatives have been discussed with the patient in detail. The patient states understanding and desires to proceed.  Continue PPI BID Return in 6 months   Therisa MICAEL Stager, PhD, [facility] Gastroenterology

## 2024-05-24 NOTE — Progress Notes (Signed)
 Gastroenterology Office Note     Primary Care Physician:  Alphonsa Glendia LABOR, MD  Primary Gastroenterologist: Dr. Cindie    Chief Complaint   Chief Complaint  Patient presents with   Follow-up    Pt arrives for follow up. Pt states everything is going alright except pt wants to sleep all the time. Pt is still taking Protonix  BID.      History of Present Illness   Bryan KEAY Sr. is an 83 y.o. male presenting today with a history of chronic anemia multifactorial in setting of chronic disease and component of IDA, baseline Hgb 10-12, heme positive stool s/p colonoscopy April 2024, strong family history of colon cancer in 2 first-degree relatives, recently undergoing EGD due to dysphagia and here for follow-up.  EGD with LA Grade C reflux esophagitis, Schatzki ring not manipulated, +candida. He will need repeat EGD to reassess healing and dilate as appropriate.  Today:  No overt GI bleeding. Took course of Diflucan  for candida esophagitis. Still with intermittent solid food dysphagia but better. No abdominal pain. Continues on PPI BID. No N/V. No bloody stools. Has no other concerns today.     EGD July 2025: LA Grade C reflux esophagitis without bleeding s/p biopsy, mild Schatzki ring not dilated, gastritis s/p biopsy, normal appearing duodenum. Path: negative H.pylori. +Candida. Negative EOE.   Colonoscopy April 2024: non-bleeding hemorrhoids, diverticulosis, five 3-6 mm polyps in ascending and cecum, one 4 mm polyp in descending colon. Path: tubular adenomas. No repeat colon due to age.    Past Medical History:  Diagnosis Date   Anemia    Basal cell carcinoma    BPH (benign prostatic hyperplasia)    GERD (gastroesophageal reflux disease)    Melanoma (HCC)    Osteoarthritis    Rheumatoid aortitis    Rheumatoid arthritis [facility])     Past Surgical History:  Procedure Laterality Date   CATARACT EXTRACTION W/PHACO Right 03/12/2020   Procedure: CATARACT EXTRACTION PHACO AND  INTRAOCULAR LENS PLACEMENT RIGHT EYE;  Surgeon: Harrie Agent, MD;  Location: [facility];  Service: Ophthalmology;  Laterality: Right;  CDE: 9.99   CATARACT EXTRACTION W/PHACO Left 03/26/2020   Procedure: CATARACT EXTRACTION PHACO AND INTRAOCULAR LENS PLACEMENT (IOC);  Surgeon: Harrie Agent, MD;  Location: [facility];  Service: Ophthalmology;  Laterality: Left;  CDE: 9.16    COLONOSCOPY     COLONOSCOPY WITH PROPOFOL  N/A 12/22/2022   Procedure: COLONOSCOPY WITH PROPOFOL ;  Surgeon: Cindie Carlin POUR, DO;  [facility];  Service: Endoscopy;  Laterality: N/A;  10:15AM;ASA 3   ESOPHAGEAL DILATION     ESOPHAGEAL DILATION N/A 03/22/2024   Procedure: DILATION, ESOPHAGUS;  Surgeon: Cindie Carlin POUR, DO;  [facility];  Service: Endoscopy;  Laterality: N/A;   ESOPHAGOGASTRODUODENOSCOPY N/A 03/22/2024   Procedure: EGD (ESOPHAGOGASTRODUODENOSCOPY);  Surgeon: Cindie Carlin POUR, DO;  [facility];  Service: Endoscopy;  Laterality: N/A;  1:15 pm, asa 2   HERNIA REPAIR     JOINT REPLACEMENT Bilateral    KNEE ARTHROPLASTY     MELANOMA EXCISION     MELANOMA EXCISION  09/22/2018   on head   POLYPECTOMY  12/22/2022   Procedure: POLYPECTOMY;  Surgeon: Cindie Carlin POUR, DO;  [facility];  Service: Endoscopy;;    Current Outpatient Medications  Medication Sig Dispense Refill   acetaminophen  (TYLENOL ) 650 MG CR tablet Take 1,300 mg by mouth 3 (three) times daily.     atorvastatin  (LIPITOR) 10 MG tablet TAKE  1 TABLET(10 MG) BY MOUTH DAILY 90 tablet 1   B Complex-C (B-COMPLEX WITH VITAMIN C) tablet Take 1 tablet by mouth daily.     CALCIUM  MAGNESIUM ZINC PO Take by mouth daily.     Carboxymethylcellulose Sodium (REFRESH PLUS OP) Apply to eye.     cetirizine (ZYRTEC) 10 MG tablet Take 10 mg by mouth daily.      cholecalciferol (VITAMIN D3) 25 MCG (1000 UNIT) tablet Take 1,000 Units by mouth daily.     docusate sodium (COLACE) 100 MG capsule Take 100 mg by mouth 2 (two)  times daily.     Ferrous Sulfate (IRON ) 325 (65 Fe) MG TABS One on Mondays and one on Fridays 30 tablet 6   FLUoxetine  (PROZAC ) 20 MG capsule Take 1 capsule (20 mg total) by mouth daily. 30 capsule 5   folic acid  (FOLVITE ) 1 MG tablet TAKE 2 TABLETS BY MOUTH DAILY 180 tablet 3   furosemide  (LASIX ) 20 MG tablet Take 20 mg by mouth daily. TAKE 1/2 TABLET(20 MG) BY MOUTH EVERY MORNING AS NEEDED FOR FLUID IN LEGS (Patient taking differently: Take 20 mg by mouth daily. TAKE 1/2 TABLET(20 MG) BY MOUTH EVERY MORNING AS NEEDED FOR FLUID IN LEGS)     Hypromellose (PURE & GENTLE LUBRICANT) 0.3 % SOLN Apply to eye.     methotrexate  (RHEUMATREX) 2.5 MG tablet Take 6 tablets (15 mg total) by mouth once a week. Caution:Chemotherapy. Protect from light. 72 tablet 0   Multiple Vitamins-Minerals (CENTRUM SILVER  PO) Take by mouth.     Multiple Vitamins-Minerals (OCUVITE PRESERVISION PO) Take by mouth.     OVER THE COUNTER MEDICATION Dr. Heriberto foot cream     OVER THE COUNTER MEDICATION Cayenne pepper     pantoprazole  (PROTONIX ) 40 MG tablet Take 1 tablet (40 mg total) by mouth 2 (two) times daily. 60 tablet 11   potassium chloride  SA (KLOR-CON  M) 20 MEQ tablet TAKE ONE TABLET BY MOUTH EVERY MORNING WHEN TAKING LASIX  30 tablet 5   Propylene Glycol (SYSTANE COMPLETE OP) Apply to eye.     tamsulosin  (FLOMAX ) 0.4 MG CAPS capsule TAKE 2 CAPSULES BY MOUTH EVERY EVENING 60 capsule 5   vitamin C (ASCORBIC ACID) 500 MG tablet Take 500 mg by mouth daily.     No current facility-administered medications for this visit.    Allergies as of 05/24/2024 - Review Complete 05/24/2024  Allergen Reaction Noted   Morphine and codeine  Other (See Comments) 01/04/2013   Acyclovir and related  08/27/2017    Family History  Problem Relation Age of Onset   Rheum arthritis Mother    Emphysema Father    Cancer Brother    Emphysema Sister     Social History   Socioeconomic History   Marital status: Married    Spouse name:  Not on file   Number of children: Not on file   Years of education: Not on file   Highest education level: Not on file  Occupational History   Not on file  Tobacco Use   Smoking status: Never    Passive exposure: Current   Smokeless tobacco: Never  Vaping Use   Vaping status: Never Used  Substance and Sexual Activity   Alcohol use: No   Drug use: No   Sexual activity: Not on file  Other Topics Concern   Not on file  Social History Narrative   Not on file   Social Drivers of Health   Financial Resource Strain: Low Risk  (01/08/2021)  Overall Financial Resource Strain (CARDIA)    Difficulty of Paying Living Expenses: Not hard at all  Food Insecurity: No Food Insecurity (05/26/2023)   Hunger Vital Sign    Worried About Running Out of Food in the Last Year: Never true    Ran Out of Food in the Last Year: Never true  Transportation Needs: No Transportation Needs (05/26/2023)   PRAPARE - Administrator, Civil Service (Medical): No    Lack of Transportation (Non-Medical): No  Physical Activity: Inactive (01/08/2021)   Exercise Vital Sign    Days of Exercise per Week: 0 days    Minutes of Exercise per Session: 0 min  Stress: No Stress Concern Present (01/08/2021)   [employer] of Occupational Health - Occupational Stress Questionnaire    Feeling of Stress : Not at all  Social Connections: Moderately Integrated (05/26/2023)   Social Connection and Isolation Panel    Frequency of Communication with Friends and Family: Twice a week    Frequency of Social Gatherings with Friends and Family: More than three times a week    Attends Religious Services: More than 4 times per year    Active Member of [employer] or Organizations: No    Attends Banker Meetings: Never    Marital Status: Married  Catering manager Violence: Not At Risk (05/26/2023)   Humiliation, Afraid, Rape, and Kick questionnaire    Fear of Current or Ex-Partner: No    Emotionally Abused: No     Physically Abused: No    Sexually Abused: No     Review of Systems   Gen: Denies any fever, chills, fatigue, weight loss, lack of appetite.  CV: Denies chest pain, heart palpitations, peripheral edema, syncope.  Resp: Denies shortness of breath at rest or with exertion. Denies wheezing or cough.  GI: Denies dysphagia or odynophagia. Denies jaundice, hematemesis, fecal incontinence. GU : Denies urinary burning, urinary frequency, urinary hesitancy Derm: Denies rash, itching, dry skin Psych: Denies depression, anxiety, memory loss, and confusion Heme: Denies bruising, bleeding, and enlarged lymph nodes.   Physical Exam   BP 120/74   Pulse 78   Temp (!) 97.2 F (36.2 C)   Ht 5' 5 (1.651 m)   Wt 180 lb 1.6 oz (81.7 kg)   BMI 29.97 kg/m  General:   Alert and oriented. Pleasant and cooperative. Well-nourished and well-developed.  Head:  Normocephalic and atraumatic. Eyes:  Without icterus Abdomen:  +BS, soft, non-tender and non-distended.  Rectal:  Deferred  Msk:  arthritic changes to hands, contractures Extremities:  Without edema. Neurologic:  Alert and  oriented x4 Psych:  Alert and cooperative. Normal mood and affect.   Assessment   Bryan MIDDLETON Sr. is an 83 y.o. male presenting today with a history of chronic anemia, GERD, recently found to have Grade C esophagitis, candida, and Schatkzi's ring not manipulated on EGD in July 2025 performed due to dysphagia. Returns today in follow-up.  GERD with esophagitis: now on PPI BID since July 2025. He will need repeat EGD to reassess response to therapy and also dilation of Schatzki's ring as he notes intermittent dysphagia although improved.   Chronic anemia: in setting of chronic disease, chronic methotrexate , iron  deficiency component. Colonoscopy on file April 2024, EGD recently completed. No overt GI bleeding. Hgb at baseline in the 10 range.    Candia: completed course of Diflucan  with improvement.     PLAN    Proceed with upper endoscopy/dilation by Dr. Cindie in near  future: the risks, benefits, and alternatives have been discussed with the patient in detail. The patient states understanding and desires to proceed.  Continue PPI BID Return in 6 months   Bryan MICAEL Stager, PhD, [facility] Gastroenterology

## 2024-05-24 NOTE — Telephone Encounter (Signed)
 Patient's wife, Suzen, contacted the office to request a refill of Methotrexate  be sent to [facility] for the patient.   Last Fill: 02/29/2024  Labs: 03/29/2024 Hemoglobin is low and stable, CMP is normal. Please forward results to his PCP.   Next Visit: 09/14/2024  Last Visit: 03/29/2024  DX: Rheumatoid arthritis involving multiple sites with positive rheumatoid factor (HCC)   Current Dose per office note 03/29/2024: Methotrexate  2.5 mg 6 tablets by mouth every 7 days   Okay to refill Methotrexate ?

## 2024-05-24 NOTE — Patient Instructions (Signed)
 Continue pantoprazole  twice a day, 30 minutes before breakfast and dinner!  We are arranging a repeat upper endoscopy with dilation in the near future!  I enjoyed seeing you again today! I value our relationship and want to provide genuine, compassionate, and quality care. You may receive a survey regarding your visit with me, and I welcome your feedback! Thanks so much for taking the time to complete this. I look forward to seeing you again.      Therisa MICAEL Stager, PhD, ANP-BC [facility] Gastroenterology

## 2024-05-25 NOTE — Pre-Procedure Instructions (Signed)
 Message received to call daughter-in-law, Suzen for this phone call. Patient will not understand information. Kimberly Kincannon-[phone].

## 2024-06-02 ENCOUNTER — Encounter (HOSPITAL_COMMUNITY)
Admission: RE | Admit: 2024-06-02 | Discharge: 2024-06-02 | Disposition: A | Discharge status: Home / Self Care | Source: Ambulatory Visit | Attending: Internal Medicine | Admitting: Internal Medicine

## 2024-06-07 ENCOUNTER — Telehealth: Payer: Self-pay | Admitting: *Deleted

## 2024-06-07 NOTE — Telephone Encounter (Signed)
 Bryan Maldonado called back and she is aware of appt change.

## 2024-06-07 NOTE — Telephone Encounter (Signed)
 Called pt and rescheduled procedure from 10/8 to 10/13. He also asked I call his daughter in law.  Called and LMTCB. Message also sent to endo

## 2024-06-10 ENCOUNTER — Encounter (HOSPITAL_COMMUNITY)
Admission: RE | Admit: 2024-06-10 | Discharge: 2024-06-10 | Disposition: A | Discharge status: Home / Self Care | Source: Ambulatory Visit | Attending: Internal Medicine | Admitting: Internal Medicine

## 2024-06-13 ENCOUNTER — Ambulatory Visit (HOSPITAL_COMMUNITY): Admitting: Anesthesiology

## 2024-06-13 ENCOUNTER — Ambulatory Visit (HOSPITAL_COMMUNITY)
Admission: RE | Admit: 2024-06-13 | Discharge: 2024-06-13 | Disposition: A | Discharge status: Home / Self Care | Attending: Internal Medicine | Admitting: Internal Medicine

## 2024-06-13 ENCOUNTER — Encounter (HOSPITAL_COMMUNITY): Payer: Self-pay | Admitting: Internal Medicine

## 2024-06-13 ENCOUNTER — Encounter (HOSPITAL_COMMUNITY)
Admission: RE | Disposition: A | Discharge status: Home / Self Care | Payer: Self-pay | Source: Home / Self Care | Attending: Internal Medicine

## 2024-06-13 ENCOUNTER — Other Ambulatory Visit: Payer: Self-pay

## 2024-06-13 DIAGNOSIS — K449 Diaphragmatic hernia without obstruction or gangrene: Secondary | ICD-10-CM

## 2024-06-13 DIAGNOSIS — I739 Peripheral vascular disease, unspecified: Secondary | ICD-10-CM | POA: Diagnosis not present

## 2024-06-13 DIAGNOSIS — Z800 Family history of malignant neoplasm of digestive organs: Secondary | ICD-10-CM | POA: Insufficient documentation

## 2024-06-13 DIAGNOSIS — K219 Gastro-esophageal reflux disease without esophagitis: Secondary | ICD-10-CM | POA: Diagnosis not present

## 2024-06-13 DIAGNOSIS — K2970 Gastritis, unspecified, without bleeding: Secondary | ICD-10-CM | POA: Insufficient documentation

## 2024-06-13 DIAGNOSIS — K2289 Other specified disease of esophagus: Secondary | ICD-10-CM | POA: Diagnosis not present

## 2024-06-13 DIAGNOSIS — Z79899 Other long term (current) drug therapy: Secondary | ICD-10-CM | POA: Diagnosis not present

## 2024-06-13 DIAGNOSIS — K222 Esophageal obstruction: Secondary | ICD-10-CM | POA: Insufficient documentation

## 2024-06-13 DIAGNOSIS — R1310 Dysphagia, unspecified: Secondary | ICD-10-CM | POA: Diagnosis not present

## 2024-06-13 DIAGNOSIS — E785 Hyperlipidemia, unspecified: Secondary | ICD-10-CM

## 2024-06-13 DIAGNOSIS — I10 Essential (primary) hypertension: Secondary | ICD-10-CM | POA: Diagnosis not present

## 2024-06-13 DIAGNOSIS — D638 Anemia in other chronic diseases classified elsewhere: Secondary | ICD-10-CM | POA: Insufficient documentation

## 2024-06-13 DIAGNOSIS — E611 Iron deficiency: Secondary | ICD-10-CM | POA: Insufficient documentation

## 2024-06-13 DIAGNOSIS — D649 Anemia, unspecified: Secondary | ICD-10-CM | POA: Insufficient documentation

## 2024-06-13 DIAGNOSIS — K317 Polyp of stomach and duodenum: Secondary | ICD-10-CM | POA: Insufficient documentation

## 2024-06-13 HISTORY — DX: Essential (primary) hypertension: I10

## 2024-06-13 HISTORY — DX: Dyspnea, unspecified: R06.00

## 2024-06-13 HISTORY — PX: ESOPHAGOGASTRODUODENOSCOPY: SHX5428

## 2024-06-13 HISTORY — PX: ESOPHAGEAL DILATION: SHX303

## 2024-06-13 SURGERY — EGD (ESOPHAGOGASTRODUODENOSCOPY)
Anesthesia: General

## 2024-06-13 MED ORDER — LACTATED RINGERS IV SOLN: INTRAVENOUS | Status: DC | PRN

## 2024-06-13 MED ORDER — PROPOFOL 10 MG/ML IV BOLUS
INTRAVENOUS | Status: DC | PRN
  Administered 2024-06-13: 50 mg via INTRAVENOUS
  Administered 2024-06-13: 30 mg via INTRAVENOUS

## 2024-06-13 MED ORDER — LIDOCAINE 2% (20 MG/ML) 5 ML SYRINGE
INTRAMUSCULAR | Status: DC | PRN
  Administered 2024-06-13: 80 mg via INTRAVENOUS

## 2024-06-13 NOTE — Interval H&P Note (Signed)
 History and Physical Interval Note:  06/13/2024 9:03 AM  Bryan LABOR Stenberg Sr.  has presented today for surgery, with the diagnosis of gerd, dysphagia.  The various methods of treatment have been discussed with the patient and family. After consideration of risks, benefits and other options for treatment, the patient has consented to  Procedure(s) with comments: EGD (ESOPHAGOGASTRODUODENOSCOPY) (N/A) - 830am, asa 3 DILATION, ESOPHAGUS (N/A) as a surgical intervention.  The patient's history has been reviewed, patient examined, no change in status, stable for surgery.  I have reviewed the patient's chart and labs.  Questions were answered to the patient's satisfaction.     Bryan Maldonado

## 2024-06-13 NOTE — Anesthesia Postprocedure Evaluation (Signed)
 Anesthesia Post Note  Patient: Sherwood LABOR Dunstan Sr.  Procedure(s) Performed: EGD (ESOPHAGOGASTRODUODENOSCOPY) DILATION, ESOPHAGUS  Patient location during evaluation: Phase II Anesthesia Type: General Level of consciousness: awake Pain management: pain level controlled Vital Signs Assessment: post-procedure vital signs reviewed and stable Respiratory status: spontaneous breathing and respiratory function stable Cardiovascular status: blood pressure returned to baseline and stable Postop Assessment: no headache and no apparent nausea or vomiting Anesthetic complications: no Comments: Late entry   No notable events documented.   Last Vitals:  Vitals:   06/13/24 0841 06/13/24 0923  BP: (!) 164/63 (!) 142/56  Pulse:  (!) 52  Resp:  14  Temp:  36.6 C  SpO2:  100%    Last Pain:  Vitals:   06/13/24 0923  TempSrc: Oral  PainSc: 0-No pain                 Yvonna JINNY Bosworth

## 2024-06-13 NOTE — Discharge Instructions (Addendum)
 EGD Discharge instructions Please read the instructions outlined below and refer to this sheet in the next few weeks. These discharge instructions provide you with general information on caring for yourself after you leave the hospital. Your doctor may also give you specific instructions. While your treatment has been planned according to the most current medical practices available, unavoidable complications occasionally occur. If you have any problems or questions after discharge, please call your doctor. ACTIVITY You may resume your regular activity but move at a slower pace for the next 24 hours.  Take frequent rest periods for the next 24 hours.  Walking will help expel (get rid of) the air and reduce the bloated feeling in your abdomen.  No driving for 24 hours (because of the anesthesia (medicine) used during the test).  You may shower.  Do not sign any important legal documents or operate any machinery for 24 hours (because of the anesthesia used during the test).  NUTRITION Drink plenty of fluids.  You may resume your normal diet.  Begin with a light meal and progress to your normal diet.  Avoid alcoholic beverages for 24 hours or as instructed by your caregiver.  MEDICATIONS You may resume your normal medications unless your caregiver tells you otherwise.  WHAT YOU CAN EXPECT TODAY You may experience abdominal discomfort such as a feeling of fullness or "gas" pains.  FOLLOW-UP Your doctor will discuss the results of your test with you.  SEEK IMMEDIATE MEDICAL ATTENTION IF ANY OF THE FOLLOWING OCCUR: Excessive nausea (feeling sick to your stomach) and/or vomiting.  Severe abdominal pain and distention (swelling).  Trouble swallowing.  Temperature over 101 F (37.8 C).  Rectal bleeding or vomiting of blood.   Your upper endoscopy revealed a small hiatal hernia. Previously noted esophagitis has healed.  Mild tightening of your esophagus again noted called a Schatzki's ring.  I  stretched this out today.  I also took samples of your esophagus.  Await pathology results, my office will contact you.  Stomach appeared the same as prior.  Small bowel was normal.  Continue on pantoprazole  twice daily.  Follow-up in GI office in 6 months.   I hope you have a great rest of your week!  Carlin POUR. Cindie, D.O. Gastroenterology and Hepatology [facility]

## 2024-06-13 NOTE — Anesthesia Preprocedure Evaluation (Signed)
 Anesthesia Evaluation  Patient identified by MRN, date of birth, ID band Patient awake    Reviewed: Allergy & Precautions, H&P , NPO status , Patient's Chart, lab work & pertinent test results, reviewed documented beta blocker date and time   Airway Mallampati: II  TM Distance: >3 FB Neck ROM: full    Dental no notable dental hx.    Pulmonary neg pulmonary ROS, shortness of breath   Pulmonary exam normal breath sounds clear to auscultation       Cardiovascular Exercise Tolerance: Good hypertension, + Peripheral Vascular Disease  negative cardio ROS  Rhythm:regular Rate:Normal     Neuro/Psych negative neurological ROS  negative psych ROS   GI/Hepatic negative GI ROS, Neg liver ROS,GERD  ,,  Endo/Other  negative endocrine ROS    Renal/GU negative Renal ROS  negative genitourinary   Musculoskeletal   Abdominal   Peds  Hematology negative hematology ROS (+) Blood dyscrasia, anemia   Anesthesia Other Findings   Reproductive/Obstetrics negative OB ROS                              Anesthesia Physical Anesthesia Plan  ASA: 3  Anesthesia Plan: General   Post-op Pain Management:    Induction:   PONV Risk Score and Plan: Propofol  infusion  Airway Management Planned:   Additional Equipment:   Intra-op Plan:   Post-operative Plan:   Informed Consent: I have reviewed the patients History and Physical, chart, labs and discussed the procedure including the risks, benefits and alternatives for the proposed anesthesia with the patient or authorized representative who has indicated his/her understanding and acceptance.     Dental Advisory Given  Plan Discussed with: CRNA  Anesthesia Plan Comments:          Anesthesia Quick Evaluation

## 2024-06-13 NOTE — Op Note (Signed)
 [facility] Patient Name: Bryan Maldonado Procedure Date: 06/13/2024 8:55 AM MRN: [id number] Date of Birth: 10-Aug-1941 Attending MD: Carlin POUR. Cindie , [place], [id number] CSN: [id number] Age: 83 Admit Type: Outpatient Procedure:                Upper GI endoscopy Indications:              Dysphagia, Heartburn Providers:                Carlin POUR. Cindie, DO, Harlene Lips, Kristine                            L. Boone Tech, Technician Referring MD:              Medicines:                See the Anesthesia note for documentation of the                            administered medications Complications:            No immediate complications. Estimated Blood Loss:     Estimated blood loss was minimal. Procedure:                Pre-Anesthesia Assessment:                           - The anesthesia plan was to use monitored                            anesthesia care (MAC).                           After obtaining informed consent, the endoscope was                            passed under direct vision. Throughout the                            procedure, the patient's blood pressure, pulse, and                            oxygen saturations were monitored continuously. The                            [id number]-[id number] ([id number]) Upper was introduced through                            the mouth, and advanced to the second part of                            duodenum. The upper GI endoscopy was accomplished                            without difficulty. The patient tolerated the                            procedure well. Scope In: 9:11:34  AM Scope Out: 9:16:20 AM Total Procedure Duration: 0 hours 4 minutes 46 seconds  Findings:      A small hiatal hernia was present.      A mild Schatzki ring was found in the distal esophagus. A TTS dilator       was passed through the scope. Dilation with an 18-19-20 mm balloon       dilator was performed to 20 mm. The dilation site was examined and       showed  moderate improvement in luminal narrowing.      The Z-line was variable and was found 39 cm from the incisors. Biopsies       were taken with a cold forceps for histology.      Patchy mild inflammation characterized by erythema was found in the       entire examined stomach.      Numerous small (all <1 cm) fundic gland polyps with no bleeding and no       stigmata of recent bleeding were found in the gastric fundus and in the       gastric body.      The duodenal bulb, first portion of the duodenum and second portion of       the duodenum were normal. Impression:               - Small hiatal hernia.                           - Mild Schatzki ring. Dilated.                           - Z-line variable, 39 cm from the incisors.                            Biopsied.                           - Gastritis.                           - Numerous gastric polyps.                           - Normal duodenal bulb, first portion of the                            duodenum and second portion of the duodenum.                           - Previously noted esophagitis has healed. Moderate Sedation:      Per Anesthesia Care Recommendation:           - Patient has a contact number available for                            emergencies. The signs and symptoms of potential                            delayed complications were discussed with the  patient. Return to normal activities tomorrow.                            Written discharge instructions were provided to the                            patient.                           - Resume previous diet.                           - Continue present medications.                           - Await pathology results.                           - Repeat upper endoscopy PRN for retreatment.                           - Return to GI clinic in 6 months. Procedure Code(s):        --- Professional ---                           [phone],  Esophagogastroduodenoscopy, flexible,                            transoral; with transendoscopic balloon dilation of                            esophagus (less than 30 mm diameter)                           43239, 59, Esophagogastroduodenoscopy, flexible,                            transoral; with biopsy, single or multiple Diagnosis Code(s):        --- Professional ---                           K44.9, Diaphragmatic hernia without obstruction or                            gangrene                           K22.2, Esophageal obstruction                           K22.89, Other specified disease of esophagus                           K29.70, Gastritis, unspecified, without bleeding                           K31.7, Polyp of stomach and duodenum  R13.10, Dysphagia, unspecified                           R12, Heartburn CPT copyright 2022 American Medical Association. All rights reserved. The codes documented in this report are preliminary and upon coder review may  be revised to meet current compliance requirements. Carlin POUR. Cindie, DO Carlin POUR. Berdell Hostetler, DO 06/13/2024 9:21:38 AM This report has been signed electronically. Number of Addenda: 0

## 2024-06-13 NOTE — Transfer of Care (Signed)
 Immediate Anesthesia Transfer of Care Note  Patient: Bryan LABOR Genis Sr.  Procedure(s) Performed: EGD (ESOPHAGOGASTRODUODENOSCOPY) DILATION, ESOPHAGUS  Patient Location: Short Stay  Anesthesia Type:General  Level of Consciousness: awake  Airway & Oxygen Therapy: Patient Spontanous Breathing  Post-op Assessment: Report given to RN and Post -op Vital signs reviewed and stable  Post vital signs: Reviewed and stable  Last Vitals:  Vitals Value Taken Time  BP 142/56 06/13/24 09:23  Temp 36.6 C 06/13/24 09:23  Pulse 52 06/13/24 09:23  Resp 14 06/13/24 09:23  SpO2 100 % 06/13/24 09:23    Last Pain:  Vitals:   06/13/24 0923  TempSrc: Oral  PainSc: 0-No pain         Complications: No notable events documented.

## 2024-06-14 ENCOUNTER — Encounter (HOSPITAL_COMMUNITY): Payer: Self-pay | Admitting: Internal Medicine

## 2024-06-14 LAB — SURGICAL PATHOLOGY

## 2024-07-06 ENCOUNTER — Encounter (INDEPENDENT_AMBULATORY_CARE_PROVIDER_SITE_OTHER): Payer: Self-pay | Admitting: Gastroenterology

## 2024-07-10 ENCOUNTER — Ambulatory Visit: Payer: Self-pay | Admitting: Internal Medicine

## 2024-07-21 ENCOUNTER — Other Ambulatory Visit: Payer: Self-pay | Admitting: Nurse Practitioner

## 2024-07-21 DIAGNOSIS — B351 Tinea unguium: Secondary | ICD-10-CM | POA: Diagnosis not present

## 2024-07-21 DIAGNOSIS — M79674 Pain in right toe(s): Secondary | ICD-10-CM | POA: Diagnosis not present

## 2024-07-21 DIAGNOSIS — M79675 Pain in left toe(s): Secondary | ICD-10-CM | POA: Diagnosis not present

## 2024-08-09 ENCOUNTER — Other Ambulatory Visit: Payer: Self-pay | Admitting: Rheumatology

## 2024-08-09 DIAGNOSIS — Z79899 Other long term (current) drug therapy: Secondary | ICD-10-CM | POA: Diagnosis not present

## 2024-08-09 NOTE — Telephone Encounter (Signed)
 Last Fill: 05/24/2024  Labs: 03/29/2024 Hemoglobin is low and stable, CMP is normal.   Next Visit: 09/14/2024  Last Visit: 03/29/2024  DX: Rheumatoid arthritis involving multiple sites with positive rheumatoid factor   Current Dose per office note on 03/29/2024: Methotrexate  2.5 mg 6 tablets by mouth every 7 days    Called to advise patient that he is due for labs, patient states he had recent labs at Dr. Steven office.   Called to obtain lab results and they will be faxed over.   Okay to refill Methotrexate ?

## 2024-08-10 ENCOUNTER — Telehealth: Payer: Self-pay | Admitting: *Deleted

## 2024-08-10 ENCOUNTER — Ambulatory Visit: Payer: Self-pay | Admitting: Family Medicine

## 2024-08-10 LAB — BASIC METABOLIC PANEL WITH GFR
BUN/Creatinine Ratio: 17 (ref 10–24)
BUN: 15 mg/dL (ref 8–27)
CO2: 24 mmol/L (ref 20–29)
Calcium: 9.3 mg/dL (ref 8.6–10.2)
Chloride: 105 mmol/L (ref 96–106)
Creatinine, Ser: 0.9 mg/dL (ref 0.76–1.27)
Glucose: 97 mg/dL (ref 70–99)
Potassium: 4.3 mmol/L (ref 3.5–5.2)
Sodium: 144 mmol/L (ref 134–144)
eGFR: 84 mL/min/1.73 (ref >59)

## 2024-08-10 NOTE — Telephone Encounter (Unsigned)
 Copied from CRM [id number]. Topic: Clinical - Lab/Test Results >> Aug 09, 2024 10:11 AM Victoria A wrote: Reason for CRM: Barabara with [facility] office called said if the last labs could be faxed to them: Fax [phone]

## 2024-08-16 NOTE — Telephone Encounter (Signed)
 Please inform them of this thank you

## 2024-09-05 NOTE — Progress Notes (Signed)
 "  Office Visit Note  Patient: Bryan Sroka Amparo Sr.             Date of Birth: 04-21-1941           MRN: [id number]             PCP: Alphonsa Glendia LABOR, MD Referring: Alphonsa Glendia LABOR, MD Visit Date: 09/14/2024 Occupation: Data Unavailable  Subjective:  Intermittent pain in joints   History of Present Illness: Bryan VIRGIN Sr. is a 84 y.o. male with seropositive rheumatoid arthritis and osteoarthritis.  He returns today after his last visit in July 2025.  He continues to have morning stiffness, discomfort during routine activities, getting up from the chair, climbing stairs and using his hands.  He remains on methotrexate  6 tablets p.o. weekly along with folic acid  2 mg daily.  He continues to get assistance from his children.  His bilateral total knee replacements are doing well.  He continues to have stiffness in his hands and his feet.  He has been followed by dermatology for melanoma and basal cell cancer.    Activities of Daily Living:  Patient reports morning stiffness for around 30 minutes.   Patient Reports nocturnal pain.  Difficulty dressing/grooming: Reports Difficulty climbing stairs: Reports Difficulty getting out of chair: Reports Difficulty using hands for taps, buttons, cutlery, and/or writing: Reports  Review of Systems  Constitutional:  Positive for fatigue.  HENT:  Positive for mouth sores and mouth dryness.   Eyes:  Positive for dryness.  Respiratory:  Positive for shortness of breath.   Cardiovascular:  Positive for palpitations. Negative for chest pain.  Gastrointestinal:  Positive for constipation and diarrhea. Negative for blood in stool.  Endocrine: Positive for increased urination.  Genitourinary:  Positive for involuntary urination.  Musculoskeletal:  Positive for joint pain, gait problem, joint pain, joint swelling, myalgias, muscle weakness, morning stiffness, muscle tenderness and myalgias.  Skin:  Positive for color change, rash and sensitivity to  sunlight. Negative for hair loss.  Allergic/Immunologic: Negative for susceptible to infections.  Neurological:  Positive for dizziness and headaches.  Hematological:  Negative for swollen glands.  Psychiatric/Behavioral:  Positive for depressed mood and sleep disturbance. The patient is nervous/anxious.     PMFS History:  Patient Active Problem List   Diagnosis Date Noted   GERD (gastroesophageal reflux disease) 05/24/2024   Dysphagia 05/24/2024   [name] 01/29/2021   Anemia, chronic disease 06/25/2020   High risk medication use 09/19/2016   History of total knee replacement, bilateral 09/19/2016   Gastroesophageal reflux disease without esophagitis 09/19/2016   History of melanoma 09/19/2016   History of basal cell carcinoma 09/19/2016   History of anemia 09/19/2016   Allergic rhinitis 02/18/2016   Prediabetes 11/20/2014   Hyperlipidemia 11/20/2014   BPH (benign prostatic hyperplasia) 05/22/2014   Rheumatoid arthritis involving multiple sites with positive rheumatoid factor (HCC) 01/05/2013   Chronic pain syndrome 01/05/2013    Past Medical History:  Diagnosis Date   Anemia    Basal cell carcinoma    BPH (benign prostatic hyperplasia)    Dyspnea    GERD (gastroesophageal reflux disease)    Hypertension    Melanoma (HCC)    Osteoarthritis    Rheumatoid aortitis    Rheumatoid arthritis (HCC)     Family History  Problem Relation Age of Onset   Rheum arthritis Mother    Emphysema Father    Cancer Brother    Emphysema Sister    Past Surgical History:  Procedure Laterality Date   CATARACT EXTRACTION W/PHACO Right 03/12/2020   Procedure: CATARACT EXTRACTION PHACO AND INTRAOCULAR LENS PLACEMENT RIGHT EYE;  Surgeon: Harrie Agent, MD;  Location: [facility];  Service: Ophthalmology;  Laterality: Right;  CDE: 9.99   CATARACT EXTRACTION W/PHACO Left 03/26/2020   Procedure: CATARACT EXTRACTION PHACO AND INTRAOCULAR LENS PLACEMENT (IOC);  Surgeon: Harrie Agent, MD;   Location: [facility];  Service: Ophthalmology;  Laterality: Left;  CDE: 9.16    COLONOSCOPY     COLONOSCOPY WITH PROPOFOL  N/A 12/22/2022   Procedure: COLONOSCOPY WITH PROPOFOL ;  Surgeon: Cindie Carlin POUR, DO;  Location: [facility];  Service: Endoscopy;  Laterality: N/A;  10:15AM;ASA 3   ESOPHAGEAL DILATION     ESOPHAGEAL DILATION N/A 03/22/2024   Procedure: DILATION, ESOPHAGUS;  Surgeon: Cindie Carlin POUR, DO;  Location: [facility];  Service: Endoscopy;  Laterality: N/A;   ESOPHAGEAL DILATION N/A 06/13/2024   Procedure: DILATION, ESOPHAGUS;  Surgeon: Cindie Carlin POUR, DO;  Location: [facility];  Service: Endoscopy;  Laterality: N/A;   ESOPHAGOGASTRODUODENOSCOPY N/A 03/22/2024   Procedure: EGD (ESOPHAGOGASTRODUODENOSCOPY);  Surgeon: Cindie Carlin POUR, DO;  Location: [facility];  Service: Endoscopy;  Laterality: N/A;  1:15 pm, asa 2   ESOPHAGOGASTRODUODENOSCOPY N/A 06/13/2024   Procedure: EGD (ESOPHAGOGASTRODUODENOSCOPY);  Surgeon: Cindie Carlin POUR, DO;  Location: [facility];  Service: Endoscopy;  Laterality: N/A;  830am, asa 3   HERNIA REPAIR     JOINT REPLACEMENT Bilateral    KNEE ARTHROPLASTY     MELANOMA EXCISION     MELANOMA EXCISION  09/22/2018   on head   POLYPECTOMY  12/22/2022   Procedure: POLYPECTOMY;  Surgeon: Cindie Carlin POUR, DO;  Location: [facility];  Service: Endoscopy;;   Social History[1] Social History   Social History Narrative   Lives with his son and daughter-in-law.     Immunization History  Administered Date(s) Administered   Fluad Quad(high Dose 65+) 06/25/2020, 06/06/2021, 06/30/2022   Fluad Trivalent(High Dose 65+) 05/26/2023   INFLUENZA, HIGH DOSE SEASONAL PF 05/20/2019, 05/10/2024   Influenza Split 05/19/2013   Influenza,inj,Quad PF,6+ Mos 05/22/2014, 05/22/2015, 06/06/2016, 06/10/2017, 05/14/2018   Influenza-Unspecified 05/20/2019   PFIZER(Purple Top)SARS-COV-2 Vaccination 11/14/2019, 12/05/2019   PNEUMOCOCCAL CONJUGATE-20 09/10/2023    Pneumococcal Conjugate-13 05/22/2014   Pneumococcal Polysaccharide-23 05/19/2013   Zoster Recombinant(Shingrix) 09/06/2018, 03/11/2019     Objective: Vital Signs: BP 134/74   Pulse (!) 50   Temp 98 F (36.7 C)   Resp 14   Ht 5' 2 (1.575 m)   Wt 181 lb 9.6 oz (82.4 kg)   BMI 33.22 kg/m    Physical Exam Vitals and nursing note reviewed.  Constitutional:      Appearance: He is well-developed.  HENT:     Head: Normocephalic and atraumatic.  Eyes:     Conjunctiva/sclera: Conjunctivae normal.     Pupils: Pupils are equal, round, and reactive to light.  Cardiovascular:     Rate and Rhythm: Normal rate and regular rhythm.     Heart sounds: Normal heart sounds.  Pulmonary:     Effort: Pulmonary effort is normal.     Breath sounds: Normal breath sounds.  Abdominal:     General: Bowel sounds are normal.     Palpations: Abdomen is soft.  Musculoskeletal:     Cervical back: Normal range of motion and neck supple.  Skin:    General: Skin is warm and dry.     Capillary Refill: Capillary refill takes less than 2 seconds.  Neurological:     Mental Status: He is alert and oriented to person, place, and time.  Psychiatric:        Behavior: Behavior normal.      Musculoskeletal Exam: Patient was examined in the seated position.  He had limited range of motion of the cervical spine without discomfort.  Thoracic kyphosis was noted without discomfort.  Lumbar spine could not be assessed.  Shoulder joint abduction was limited to about 90 degrees bilaterally without discomfort.  He had contractures in his elbows with no synovitis.  He had limited range of motion of his wrist joints without synovitis.  He had bilateral synovial thickening and ulnar deviation.  Swan-neck deformities were noted.  Hip joints could not be assessed.  Knee joints were replaced.  No warmth swelling or effusion was noted.  There was no tenderness over ankles or MTPs.  CDAI Exam: CDAI Score: -- Patient Global: 50  / 100; Provider Global: 50 / 100 Swollen: --; Tender: -- Joint Exam 09/14/2024   No joint exam has been documented for this visit   There is currently no information documented on the homunculus. Go to the Rheumatology activity and complete the homunculus joint exam.  Investigation: No additional findings.  Imaging: No results found.  Recent Labs: Lab Results  Component Value Date   WBC 6.2 03/29/2024   HGB 10.5 (L) 03/29/2024   PLT 190 03/29/2024   NA 144 08/09/2024   K 4.3 08/09/2024   CL 105 08/09/2024   CO2 24 08/09/2024   GLUCOSE 97 08/09/2024   BUN 15 08/09/2024   CREATININE 0.90 08/09/2024   BILITOT 0.8 03/29/2024   ALKPHOS 69 04/02/2023   AST 24 03/29/2024   ALT 20 03/29/2024   PROT 6.6 03/29/2024   ALBUMIN 4.0 04/02/2023   CALCIUM  9.3 08/09/2024   GFRAA 96 01/14/2021    Speciality Comments: No specialty comments available.  Procedures:  No procedures performed Allergies: Morphine and codeine  and Acyclovir and related   Assessment / Plan:     Visit Diagnoses: Rheumatoid arthritis involving multiple sites with positive rheumatoid factor (HCC) - Positive rheumatoid factor, positive anti-CCP, severe erosive disease with nodulosis and contractures: Patient had no synovitis on the examination.  However he has multiple contractures and limited mobility in multiple joints.  He was examined in the seated position.  He continues to have chronic discomfort due to contractures.  He takes Tylenol  on pain basis.  He remains on methotrexate  6 tablets weekly without any interruption.  High risk medication use - Methotrexate  2.5 mg 6 tablets by mouth every 7 days and folic acid  1 mg 2 tablets daily.  Labs obtained in July 2025 were stable with hemoglobin low at 10.5, creatinine and LFTs were normal.  Will get labs today.  Having labs every 3 months was emphasized.  Information reimmunization was placed in the AVS.  He was advised to hold methotrexate  if he develops an infection  resume after the infection resolves.  History of total knee replacement, bilateral-he had good range of motion without discomfort.  Pain in both feet-he denies any discomfort.  Chronic pain syndrome-he continues to have discomfort in multiple joints.  He has been taking Tylenol  for pain management.  Other medical problems are listed as follows:  Prediabetes  History of melanoma - followed by dermatology.  History of hyperlipidemia  History of gastroesophageal reflux (GERD) - he had problems with dysphagia.  He reports having esophageal dilation.  History of anemia  History of basal cell  carcinoma  Grieving - grieving process after losing his wife. He gets help from his son and daughter-in-law. He also has a friend who helps him over the weekends.  Orders: Orders Placed This Encounter  Procedures   CBC with Differential/Platelet   Comprehensive metabolic panel with GFR   No orders of the defined types were placed in this encounter.    Follow-Up Instructions: Return in about 5 months (around 02/12/2025) for Rheumatoid arthritis.   Maya Nash, MD  Note - This record has been created using Animal nutritionist.  Chart creation errors have been sought, but may not always  have been located. Such creation errors do not reflect on  the standard of medical care.     [1]  Social History Tobacco Use   Smoking status: Never    Passive exposure: Current   Smokeless tobacco: Never  Vaping Use   Vaping status: Never Used  Substance Use Topics   Alcohol use: No   Drug use: No   "

## 2024-09-07 ENCOUNTER — Other Ambulatory Visit: Payer: Self-pay | Admitting: Family Medicine

## 2024-09-07 DIAGNOSIS — F324 Major depressive disorder, single episode, in partial remission: Secondary | ICD-10-CM

## 2024-09-14 ENCOUNTER — Encounter: Payer: Self-pay | Admitting: Rheumatology

## 2024-09-14 ENCOUNTER — Ambulatory Visit: Attending: Rheumatology | Admitting: Rheumatology

## 2024-09-14 VITALS — BP 134/74 | HR 50 | Temp 98.0°F | Resp 14 | Ht 62.0 in | Wt 181.6 lb

## 2024-09-14 DIAGNOSIS — Z8639 Personal history of other endocrine, nutritional and metabolic disease: Secondary | ICD-10-CM

## 2024-09-14 DIAGNOSIS — Z79899 Other long term (current) drug therapy: Secondary | ICD-10-CM

## 2024-09-14 DIAGNOSIS — M0579 Rheumatoid arthritis with rheumatoid factor of multiple sites without organ or systems involvement: Secondary | ICD-10-CM | POA: Diagnosis not present

## 2024-09-14 DIAGNOSIS — M79672 Pain in left foot: Secondary | ICD-10-CM

## 2024-09-14 DIAGNOSIS — Z96653 Presence of artificial knee joint, bilateral: Secondary | ICD-10-CM | POA: Diagnosis not present

## 2024-09-14 DIAGNOSIS — Z862 Personal history of diseases of the blood and blood-forming organs and certain disorders involving the immune mechanism: Secondary | ICD-10-CM | POA: Diagnosis not present

## 2024-09-14 DIAGNOSIS — Z85820 Personal history of malignant melanoma of skin: Secondary | ICD-10-CM | POA: Diagnosis not present

## 2024-09-14 DIAGNOSIS — M79671 Pain in right foot: Secondary | ICD-10-CM

## 2024-09-14 DIAGNOSIS — G894 Chronic pain syndrome: Secondary | ICD-10-CM

## 2024-09-14 DIAGNOSIS — Z8719 Personal history of other diseases of the digestive system: Secondary | ICD-10-CM | POA: Diagnosis not present

## 2024-09-14 DIAGNOSIS — F4321 Adjustment disorder with depressed mood: Secondary | ICD-10-CM

## 2024-09-14 DIAGNOSIS — R7303 Prediabetes: Secondary | ICD-10-CM | POA: Diagnosis not present

## 2024-09-14 DIAGNOSIS — Z85828 Personal history of other malignant neoplasm of skin: Secondary | ICD-10-CM

## 2024-09-14 NOTE — Patient Instructions (Addendum)
 Standing Labs We placed an order today for your standing lab work.   Please have your standing labs drawn in April and every 3 months  Please have your labs drawn 2 weeks prior to your appointment so that the provider can discuss your lab results at your appointment, if possible.  Please note that you may see your imaging and lab results in MyChart before we have reviewed them. We will contact you once all results are reviewed. Please allow our office up to 72 hours to thoroughly review all of the results before contacting the office for clarification of your results.  WALK-IN LAB HOURS  Monday through Thursday from 8:00 am - 4:30 pm and Friday from 8:00 am-12:00 pm.  Patients with office visits requiring labs will be seen before walk-in labs.  You may encounter longer than normal wait times. Please allow additional time. Wait times may be shorter on  Monday and Thursday afternoons.  We do not book appointments for walk-in labs. We appreciate your patience and understanding with our staff.   Labs are drawn by [facility]. Please bring your co-pay at the time of your lab draw.  You may receive a bill from [facility] for your lab work.  Please note if you are on Hydroxychloroquine and and an order has been placed for a Hydroxychloroquine level,  you will need to have it drawn 4 hours or more after your last dose.  If you wish to have your labs drawn at another location, please call the office 24 hours in advance so we can fax the orders.  The office is located at [address], [place], [place] [address]   If you have any questions regarding directions or hours of operation,  please call [phone].   As a reminder, please drink plenty of water prior to coming for your lab work. Thanks!   Vaccines You are taking a medication(s) that can suppress your immune system.  The following immunizations are recommended: Flu annually Covid-19  RSV Td/Tdap (tetanus, diphtheria, pertussis)  every 10 years Pneumonia (Prevnar 15 then Pneumovax 23 at least 1 year apart.  Alternatively, can take Prevnar 20 without needing additional dose) Shingrix: 2 doses from 4 weeks to 6 months apart  Please check with your PCP to make sure you are up to date.

## 2024-09-15 ENCOUNTER — Ambulatory Visit: Payer: Self-pay | Admitting: Rheumatology

## 2024-09-15 LAB — CBC WITH DIFFERENTIAL/PLATELET
Absolute Lymphocytes: 2337 {cells}/uL (ref 850–3900)
Absolute Monocytes: 429 {cells}/uL (ref 200–950)
Basophils Absolute: 21 {cells}/uL (ref 0–200)
Basophils Relative: 0.4 %
Eosinophils Absolute: 122 {cells}/uL (ref 15–500)
Eosinophils Relative: 2.3 %
HCT: 31.3 % — ABNORMAL LOW (ref 39.4–51.1)
Hemoglobin: 10 g/dL — ABNORMAL LOW (ref 13.2–17.1)
MCH: 32.1 pg (ref 27.0–33.0)
MCHC: 31.9 g/dL (ref 31.6–35.4)
MCV: 100.3 fL (ref 81.4–101.7)
MPV: 9.7 fL (ref 7.5–12.5)
Monocytes Relative: 8.1 %
Neutro Abs: 2390 {cells}/uL (ref 1500–7800)
Neutrophils Relative %: 45.1 %
Platelets: 194 Thousand/uL (ref 140–400)
RBC: 3.12 Million/uL — ABNORMAL LOW (ref 4.20–5.80)
RDW: 14 % (ref 11.0–15.0)
Total Lymphocyte: 44.1 %
WBC: 5.3 Thousand/uL (ref 3.8–10.8)

## 2024-09-15 LAB — COMPREHENSIVE METABOLIC PANEL WITH GFR
AG Ratio: 1.7 (calc) (ref 1.0–2.5)
ALT: 11 U/L (ref 9–46)
AST: 17 U/L (ref 10–35)
Albumin: 4 g/dL (ref 3.6–5.1)
Alkaline phosphatase (APISO): 63 U/L (ref 35–144)
BUN: 14 mg/dL (ref 7–25)
CO2: 30 mmol/L (ref 20–32)
Calcium: 8.8 mg/dL (ref 8.6–10.3)
Chloride: 105 mmol/L (ref 98–110)
Creat: 0.76 mg/dL (ref 0.70–1.22)
Globulin: 2.4 g/dL (ref 1.9–3.7)
Glucose, Bld: 114 mg/dL — ABNORMAL HIGH (ref 65–99)
Potassium: 4.6 mmol/L (ref 3.5–5.3)
Sodium: 141 mmol/L (ref 135–146)
Total Bilirubin: 0.8 mg/dL (ref 0.2–1.2)
Total Protein: 6.4 g/dL (ref 6.1–8.1)
eGFR: 89 mL/min/1.73m2

## 2024-09-15 NOTE — Progress Notes (Signed)
 Hemoglobin remains low and stable.  CMP is normal.  Please forward results to his PCP.  Patient should take multivitamin with iron .  He should also take folic acid  on a regular basis.

## 2024-10-10 ENCOUNTER — Ambulatory Visit: Admitting: Family Medicine

## 2025-02-14 ENCOUNTER — Ambulatory Visit: Admitting: Rheumatology
